# Patient Record
Sex: Female | Born: 1970 | Race: Black or African American | Hispanic: No | Marital: Married | Smoking: Former smoker
Health system: Southern US, Community
[De-identification: ages and names within clinical notes are randomized; demographics above are authoritative.]

## PROBLEM LIST (undated history)

## (undated) ENCOUNTER — Inpatient Hospital Stay (HOSPITAL_COMMUNITY): Payer: Self-pay

## (undated) DIAGNOSIS — T7840XA Allergy, unspecified, initial encounter: Secondary | ICD-10-CM

## (undated) DIAGNOSIS — F32A Depression, unspecified: Secondary | ICD-10-CM

## (undated) DIAGNOSIS — G4730 Sleep apnea, unspecified: Secondary | ICD-10-CM

## (undated) DIAGNOSIS — Z5189 Encounter for other specified aftercare: Secondary | ICD-10-CM

## (undated) DIAGNOSIS — R519 Headache, unspecified: Secondary | ICD-10-CM

## (undated) DIAGNOSIS — F329 Major depressive disorder, single episode, unspecified: Secondary | ICD-10-CM

## (undated) DIAGNOSIS — K219 Gastro-esophageal reflux disease without esophagitis: Secondary | ICD-10-CM

## (undated) DIAGNOSIS — I10 Essential (primary) hypertension: Secondary | ICD-10-CM

## (undated) DIAGNOSIS — F419 Anxiety disorder, unspecified: Secondary | ICD-10-CM

## (undated) DIAGNOSIS — D649 Anemia, unspecified: Secondary | ICD-10-CM

## (undated) DIAGNOSIS — M1990 Unspecified osteoarthritis, unspecified site: Secondary | ICD-10-CM

## (undated) DIAGNOSIS — O210 Mild hyperemesis gravidarum: Secondary | ICD-10-CM

## (undated) DIAGNOSIS — O09529 Supervision of elderly multigravida, unspecified trimester: Secondary | ICD-10-CM

## (undated) HISTORY — DX: Unspecified osteoarthritis, unspecified site: M19.90

## (undated) HISTORY — PX: WISDOM TOOTH EXTRACTION: SHX21

## (undated) HISTORY — PX: UPPER GASTROINTESTINAL ENDOSCOPY: SHX188

## (undated) HISTORY — DX: Supervision of elderly multigravida, unspecified trimester: O09.529

## (undated) HISTORY — DX: Essential (primary) hypertension: I10

## (undated) HISTORY — DX: Allergy, unspecified, initial encounter: T78.40XA

## (undated) HISTORY — PX: KNEE ARTHROSCOPY: SUR90

## (undated) HISTORY — PX: DILATION AND CURETTAGE OF UTERUS: SHX78

## (undated) HISTORY — DX: Sleep apnea, unspecified: G47.30

## (undated) HISTORY — DX: Encounter for other specified aftercare: Z51.89

## (undated) HISTORY — DX: Anxiety disorder, unspecified: F41.9

---

## 2013-01-21 LAB — OB RESULTS CONSOLE ABO/RH: RH Type: POSITIVE

## 2013-01-21 LAB — OB RESULTS CONSOLE ANTIBODY SCREEN: Antibody Screen: NEGATIVE

## 2013-01-21 LAB — OB RESULTS CONSOLE GC/CHLAMYDIA
Chlamydia: NEGATIVE
Gonorrhea: NEGATIVE

## 2013-01-21 LAB — OB RESULTS CONSOLE RPR: RPR: NONREACTIVE

## 2013-01-21 LAB — OB RESULTS CONSOLE HIV ANTIBODY (ROUTINE TESTING): HIV: NONREACTIVE

## 2013-01-21 LAB — OB RESULTS CONSOLE RUBELLA ANTIBODY, IGM: Rubella: IMMUNE

## 2013-01-21 LAB — OB RESULTS CONSOLE HEPATITIS B SURFACE ANTIGEN: Hepatitis B Surface Ag: NEGATIVE

## 2013-01-22 ENCOUNTER — Other Ambulatory Visit (HOSPITAL_COMMUNITY): Payer: Self-pay | Admitting: Nurse Practitioner

## 2013-01-22 ENCOUNTER — Encounter (HOSPITAL_COMMUNITY): Payer: Self-pay | Admitting: Nurse Practitioner

## 2013-01-22 DIAGNOSIS — Z3682 Encounter for antenatal screening for nuchal translucency: Secondary | ICD-10-CM

## 2013-02-13 ENCOUNTER — Ambulatory Visit (HOSPITAL_COMMUNITY)
Admission: RE | Admit: 2013-02-13 | Discharge: 2013-02-13 | Disposition: A | Discharge status: Home / Self Care | Payer: Medicaid Other | Source: Ambulatory Visit | Attending: Nurse Practitioner | Admitting: Nurse Practitioner

## 2013-02-13 ENCOUNTER — Other Ambulatory Visit (HOSPITAL_COMMUNITY): Payer: Self-pay | Admitting: Nurse Practitioner

## 2013-02-13 ENCOUNTER — Encounter (HOSPITAL_COMMUNITY): Payer: Self-pay

## 2013-02-13 ENCOUNTER — Ambulatory Visit (HOSPITAL_COMMUNITY): Admission: RE | Admit: 2013-02-13 | Payer: Medicaid Other | Source: Ambulatory Visit

## 2013-02-13 VITALS — BP 127/80 | HR 70 | Wt 202.5 lb

## 2013-02-13 DIAGNOSIS — O09529 Supervision of elderly multigravida, unspecified trimester: Secondary | ICD-10-CM | POA: Insufficient documentation

## 2013-02-13 DIAGNOSIS — Z3682 Encounter for antenatal screening for nuchal translucency: Secondary | ICD-10-CM

## 2013-02-13 DIAGNOSIS — Z3689 Encounter for other specified antenatal screening: Secondary | ICD-10-CM | POA: Insufficient documentation

## 2013-02-13 DIAGNOSIS — O351XX0 Maternal care for (suspected) chromosomal abnormality in fetus, not applicable or unspecified: Secondary | ICD-10-CM | POA: Insufficient documentation

## 2013-02-13 DIAGNOSIS — O3510X0 Maternal care for (suspected) chromosomal abnormality in fetus, unspecified, not applicable or unspecified: Secondary | ICD-10-CM | POA: Insufficient documentation

## 2013-02-13 DIAGNOSIS — O09521 Supervision of elderly multigravida, first trimester: Secondary | ICD-10-CM

## 2013-02-13 NOTE — Progress Notes (Signed)
Genetic Counseling  High-Risk Gestation Note  Appointment Date:  02/13/2013 Referred By: Brittany Spindle, NP Date of Birth:  09-14-1970 Partner: Brittany Cherry   Pregnancy History: [id number] Estimated Date of Delivery: 08/22/13 Estimated Gestational Age: [redacted]w[redacted]d Attending: Damaris Hippo, MD  Ms. Brittany Cherry was seen for genetic counseling, because of a maternal age of 42.    She was counseled regarding maternal age and the association with risk for chromosome conditions due to nondisjunction with aging of the ova.   We reviewed chromosomes, nondisjunction, and the associated 1 in 22 risk for fetal aneuploidy related to a maternal age of 42 y.o. at [redacted]w[redacted]d gestation.  She was counseled that the risk for aneuploidy decreases as gestational age increases, accounting for those pregnancies which spontaneously abort.  We specifically discussed Down syndrome (trisomy 57), trisomies 1 and 3, and sex chromosome aneuploidies (47,XXX and 47,XXY) including the common features and prognoses of each.   We reviewed available screening options including First Screen, Quad screen, noninvasive prenatal screening (NIPS)/cell free fetal DNA (cffDNA) testing, and detailed ultrasound.  She was counseled that screening tests are used to modify a patient's a priori risk for aneuploidy, typically based on age. This estimate provides a pregnancy specific risk assessment. We reviewed the benefits and limitations of each option. Specifically, we discussed the conditions for which each test screens, the detection rates, and false positive rates of each.  She was also counseled regarding diagnostic testing via CVS and amniocentesis. We reviewed the associated risks for complications, including spontaneous pregnancy loss.  Ms Cherry reported that she is currently uninsured, but has applied for pregnancy medicaid.  This considered, we also reviewed the cost of each screening option and the gestational window in which each  option is available.  After consideration of all the options, she elected to proceed with First trimester screening.  She would like to consider the option of NIPS (panorama testing), if her medicaid is approved.  A nuchal translucency ultrasound was attempted today; however, due to fetal positioning, a nuchal translucency measurement could not be obtained.  Brittany Cherry was scheduled to return next week for a repeat nuchal translucency ultrasound, and if an NT measurement is obtained, First trimester screening. In addition, the patient would like to return for a detailed ultrasound at ~18+ weeks gestation.  She understands that screening tests cannot rule out all birth defects or genetic syndromes. The patient was advised of this limitation and states she still does not want additional testing at this time.    Brittany Cherry was provided with written information regarding sickle cell anemia (SCA) including the carrier frequency and incidence in the African-American population, the availability of carrier testing and prenatal diagnosis if indicated.  In addition, we discussed that hemoglobinopathies are routinely screened for as part of the  newborn screening panel.  She declined hemoglobin electrophoresis today.  Both family histories were reviewed and found to be noncontributory for birth defects, intellectual disability, and known genetic conditions. Without further information regarding the provided family history, an accurate genetic risk cannot be calculated. Further genetic counseling is warranted if more information is obtained.  Brittany Cherry denied exposure to environmental toxins or chemical agents. She denied the use of alcohol, tobacco or street drugs. She denied significant viral illnesses during the course of her pregnancy. Her medical and surgical histories were contributory for two SABs and two TABs.  I counseled Brittany Cherry regarding the above risks and available options.  The approximate  face-to-face time with the  genetic counselor was 43 minutes.  Donald Prose, MS Certified Genetic Counselor

## 2013-02-16 ENCOUNTER — Encounter (HOSPITAL_COMMUNITY): Payer: Self-pay

## 2013-02-16 ENCOUNTER — Inpatient Hospital Stay (HOSPITAL_COMMUNITY)
Admission: AD | Admit: 2013-02-16 | Discharge: 2013-02-16 | Disposition: A | Discharge status: Home / Self Care | Payer: Medicaid Other | Source: Ambulatory Visit | Attending: Obstetrics & Gynecology | Admitting: Obstetrics & Gynecology

## 2013-02-16 DIAGNOSIS — B3731 Acute candidiasis of vulva and vagina: Secondary | ICD-10-CM | POA: Insufficient documentation

## 2013-02-16 DIAGNOSIS — B373 Candidiasis of vulva and vagina: Secondary | ICD-10-CM

## 2013-02-16 DIAGNOSIS — Z87891 Personal history of nicotine dependence: Secondary | ICD-10-CM | POA: Insufficient documentation

## 2013-02-16 DIAGNOSIS — O2390 Unspecified genitourinary tract infection in pregnancy, unspecified trimester: Secondary | ICD-10-CM | POA: Insufficient documentation

## 2013-02-16 DIAGNOSIS — L293 Anogenital pruritus, unspecified: Secondary | ICD-10-CM | POA: Insufficient documentation

## 2013-02-16 DIAGNOSIS — O210 Mild hyperemesis gravidarum: Secondary | ICD-10-CM | POA: Insufficient documentation

## 2013-02-16 HISTORY — DX: Mild hyperemesis gravidarum: O21.0

## 2013-02-16 LAB — URINALYSIS, ROUTINE W REFLEX MICROSCOPIC
Ketones, ur: 15 mg/dL — AB
Leukocytes, UA: NEGATIVE
Nitrite: NEGATIVE
Protein, ur: NEGATIVE mg/dL
Specific Gravity, Urine: >1.03 — ABNORMAL HIGH (ref 1.005–1.030)
Urobilinogen, UA: 4 mg/dL — ABNORMAL HIGH (ref 0.0–1.0)

## 2013-02-16 LAB — WET PREP, GENITAL: Clue Cells Wet Prep HPF POC: NONE SEEN

## 2013-02-16 MED ORDER — PROMETHAZINE HCL 25 MG PO TABS: 25.0000 mg | ORAL_TABLET | Freq: Four times a day (QID) | ORAL | Status: DC | PRN

## 2013-02-16 MED ORDER — ONDANSETRON 8 MG PO TBDP
8.0000 mg | ORAL_TABLET | Freq: Once | ORAL | Status: AC
  Administered 2013-02-16: 8 mg via ORAL
  Filled 2013-02-16: qty 1

## 2013-02-16 NOTE — Progress Notes (Signed)
Written and verbal d/c instructions given and understanding voiced. 

## 2013-02-16 NOTE — MAU Note (Signed)
Pt states Wednesday began vomiting post u/s here. Thursday and Friday vomited at least 4 times per day. Does have more spitting. Denies diarrhea. Has been constipated and had first bm this week today. Pt states also here for vaginal itching that began this am, vagina feels dry, no abnormal discharge noted. Denies pain.

## 2013-02-16 NOTE — MAU Provider Note (Signed)
History     CSN: [id number]  Arrival date and time: 02/16/13 1649   First Provider Initiated Contact with Patient 02/16/13 1739      Chief Complaint  Patient presents with  . Hyperemesis Gravidarum   HPI Brittany Cherry 42 y.o. [redacted]w[redacted]d  Client of the health department.  Comes to MAU with vomiting x one today.  Not able to take in many fluids today.  Has only had broth and water today.  Vomited 4x a day on Thursday and Friday.  Does not have medication for vomiting.   Has stopped taking the Vitamin B6 and Unisom as she was at the dentist and got antibiotics and pain medication from the dentist.  Did not want to take too many medications.   Also is having some vulvar itching and thinks she has a yeast infection.   Got a Terazol one day yeast treatment but has not used it yet.  OB History   Grav Para Term Preterm Abortions TAB SAB Ect Mult Living   8 3 3  4 2 2   3       Past Medical History  Diagnosis Date  . Hyperemesis gravidarum     Past Surgical History  Procedure Laterality Date  . Dilation and curettage of uterus    . Wisdom tooth extraction      History reviewed. No pertinent family history.  History  Substance Use Topics  . Smoking status: Former Games developer  . Smokeless tobacco: Never Used  . Alcohol Use: No    Allergies: No Known Allergies  Prescriptions prior to admission  Medication Sig Dispense Refill  . acetaminophen (TYLENOL) 325 MG tablet Take 650 mg by mouth every 6 (six) hours as needed (toothache).      Marland Kitchen acetaminophen-codeine (TYLENOL #3) 300-30 MG per tablet Take by mouth every 6 (six) hours as needed for moderate pain.      Marland Kitchen amoxicillin (AMOXIL) 500 MG tablet Take 500 mg by mouth 3 (three) times daily.      Marland Kitchen doxylamine, Sleep, (UNISOM) 25 MG tablet Take 25 mg by mouth at bedtime as needed (nausea).      Marland Kitchen ibuprofen (ADVIL,MOTRIN) 200 MG tablet Take 600 mg by mouth every 6 (six) hours as needed.      . Prenatal Vit-Fe Fumarate-FA (PRENATAL  MULTIVITAMIN) TABS tablet Take 1 tablet by mouth daily at 12 noon.      . pyridoxine (B-6) 100 MG tablet Take 100 mg by mouth as needed (nausea).        Review of Systems  Constitutional: Negative for fever.  Gastrointestinal: Positive for nausea, vomiting and constipation. Negative for abdominal pain and diarrhea.  Genitourinary:       No vaginal discharge. No vaginal bleeding. No dysuria. Vulvar itching.   Physical Exam   Blood pressure 110/62, pulse 71, temperature 98 F (36.7 C), temperature source Oral, resp. rate 18, height 5\' 2"  (1.575 m), weight 199 lb 6 oz (90.436 kg), last menstrual period 11/15/2012.  Physical Exam  Nursing note and vitals reviewed. Constitutional: She is oriented to person, place, and time. She appears well-developed and well-nourished.  HENT:  Head: Normocephalic.  Eyes: EOM are normal.  Neck: Neck supple.  GI: Soft. There is no tenderness. There is no rebound and no guarding.  FHT heard with doppler.  Genitourinary:  Speculum exam: Vagina - Small amount of creamy discharge, no odor Cervix - No contact bleeding Bimanual exam: Cervix closed Uterus non tender, enlarged Adnexa non tender, no masses  bilaterally GC/Chlam, wet prep done Chaperone present for exam.  Musculoskeletal: Normal range of motion.  Neurological: She is alert and oriented to person, place, and time.  Skin: Skin is warm and dry.  Psychiatric: She has a normal mood and affect.    MAU Course  Procedures Results for orders placed during the hospital encounter of 02/16/13 (from the past 24 hour(s))  URINALYSIS, ROUTINE W REFLEX MICROSCOPIC     Status: Abnormal   Collection Time    02/16/13  5:01 PM      Result Value Range   Color, Urine YELLOW  YELLOW   APPearance CLEAR  CLEAR   Specific Gravity, Urine >1.030 (*) 1.005 - 1.030   pH 6.0  5.0 - 8.0   Glucose, UA NEGATIVE  NEGATIVE mg/dL   Hgb urine dipstick NEGATIVE  NEGATIVE   Bilirubin Urine MODERATE (*) NEGATIVE    Ketones, ur 15 (*) NEGATIVE mg/dL   Protein, ur NEGATIVE  NEGATIVE mg/dL   Urobilinogen, UA 4.0 (*) 0.0 - 1.0 mg/dL   Nitrite NEGATIVE  NEGATIVE   Leukocytes, UA NEGATIVE  NEGATIVE  WET PREP, GENITAL     Status: Abnormal   Collection Time    02/16/13  5:57 PM      Result Value Range   Yeast Wet Prep HPF POC FEW (*) NONE SEEN   Trich, Wet Prep NONE SEEN  NONE SEEN   Clue Cells Wet Prep HPF POC NONE SEEN  NONE SEEN   WBC, Wet Prep HPF POC FEW (*) NONE SEEN    MDM Client drove herself to the hospital so will give Zofran ODT and prescription for Phenergan.  Currently has presumptive only Medicaid which does not include prescription benefits.  Assessment and Plan  Vaginal yeast infection Morning sickness  Plan Zofran 8 mg ODT in MAU Rx phenergan 25 mg one PO q 6 hours prn for vomiting. Advise to restart Unisom and B6. Keep appointments at the health dept. Use Terazol 1 day treatment that you purchased. Drink at least 8 8-oz glasses of water every day.   BURLESON,TERRI 02/16/2013, 5:59 PM

## 2013-02-19 ENCOUNTER — Other Ambulatory Visit: Payer: Self-pay | Admitting: Obstetrics & Gynecology

## 2013-02-19 DIAGNOSIS — O09529 Supervision of elderly multigravida, unspecified trimester: Secondary | ICD-10-CM

## 2013-02-19 DIAGNOSIS — Z3682 Encounter for antenatal screening for nuchal translucency: Secondary | ICD-10-CM

## 2013-02-20 ENCOUNTER — Ambulatory Visit (HOSPITAL_COMMUNITY): Admission: RE | Admit: 2013-02-20 | Payer: Medicaid Other | Source: Ambulatory Visit

## 2013-02-20 ENCOUNTER — Ambulatory Visit (HOSPITAL_COMMUNITY)
Admission: RE | Admit: 2013-02-20 | Discharge: 2013-02-20 | Disposition: A | Discharge status: Home / Self Care | Payer: Medicaid Other | Source: Ambulatory Visit | Attending: Nurse Practitioner | Admitting: Nurse Practitioner

## 2013-02-20 ENCOUNTER — Encounter (HOSPITAL_COMMUNITY): Payer: Self-pay

## 2013-02-20 ENCOUNTER — Other Ambulatory Visit: Payer: Self-pay | Admitting: Obstetrics & Gynecology

## 2013-02-20 DIAGNOSIS — O351XX0 Maternal care for (suspected) chromosomal abnormality in fetus, not applicable or unspecified: Secondary | ICD-10-CM | POA: Insufficient documentation

## 2013-02-20 DIAGNOSIS — O09529 Supervision of elderly multigravida, unspecified trimester: Secondary | ICD-10-CM | POA: Insufficient documentation

## 2013-02-20 DIAGNOSIS — Z3682 Encounter for antenatal screening for nuchal translucency: Secondary | ICD-10-CM

## 2013-02-20 DIAGNOSIS — Z3689 Encounter for other specified antenatal screening: Secondary | ICD-10-CM | POA: Insufficient documentation

## 2013-02-20 DIAGNOSIS — O3510X0 Maternal care for (suspected) chromosomal abnormality in fetus, unspecified, not applicable or unspecified: Secondary | ICD-10-CM | POA: Insufficient documentation

## 2013-02-21 ENCOUNTER — Other Ambulatory Visit (HOSPITAL_COMMUNITY): Payer: Self-pay | Admitting: Nurse Practitioner

## 2013-02-21 DIAGNOSIS — Z3689 Encounter for other specified antenatal screening: Secondary | ICD-10-CM

## 2013-03-27 ENCOUNTER — Ambulatory Visit (HOSPITAL_COMMUNITY)
Admission: RE | Admit: 2013-03-27 | Discharge: 2013-03-27 | Disposition: A | Discharge status: Home / Self Care | Payer: BC Managed Care – PPO | Source: Ambulatory Visit | Attending: Nurse Practitioner | Admitting: Nurse Practitioner

## 2013-03-27 DIAGNOSIS — Z363 Encounter for antenatal screening for malformations: Secondary | ICD-10-CM | POA: Insufficient documentation

## 2013-03-27 DIAGNOSIS — Z3689 Encounter for other specified antenatal screening: Secondary | ICD-10-CM

## 2013-03-27 DIAGNOSIS — Z1389 Encounter for screening for other disorder: Secondary | ICD-10-CM | POA: Insufficient documentation

## 2013-03-27 DIAGNOSIS — O09529 Supervision of elderly multigravida, unspecified trimester: Secondary | ICD-10-CM | POA: Insufficient documentation

## 2013-03-27 DIAGNOSIS — O358XX0 Maternal care for other (suspected) fetal abnormality and damage, not applicable or unspecified: Secondary | ICD-10-CM | POA: Insufficient documentation

## 2013-04-03 ENCOUNTER — Other Ambulatory Visit: Payer: Self-pay

## 2013-04-11 NOTE — L&D Delivery Note (Signed)
Delivery Note At 12:36 PM a viable and healthy female was delivered via Vaginal, Spontaneous Delivery (Presentation: ; Occiput Anterior).  APGAR: 9, 9; weight pend.   Placenta status: Intact, Spontaneous.  Cord: 3 vessels with the following complications: None.    Anesthesia: Epidural  Episiotomy: None Lacerations: None Est. Blood Loss (mL): 100  Mom to postpartum.  Baby to Couplet care / Skin to Skin.  Venia Carbon N Muhammad 08/18/2013, 1:25 PM

## 2013-04-22 ENCOUNTER — Other Ambulatory Visit (HOSPITAL_COMMUNITY): Payer: Self-pay | Admitting: Nurse Practitioner

## 2013-04-22 DIAGNOSIS — O289 Unspecified abnormal findings on antenatal screening of mother: Secondary | ICD-10-CM

## 2013-04-22 DIAGNOSIS — O09529 Supervision of elderly multigravida, unspecified trimester: Secondary | ICD-10-CM

## 2013-04-24 ENCOUNTER — Encounter (HOSPITAL_COMMUNITY): Payer: Self-pay

## 2013-04-24 ENCOUNTER — Ambulatory Visit (HOSPITAL_COMMUNITY)
Admission: RE | Admit: 2013-04-24 | Discharge: 2013-04-24 | Disposition: A | Discharge status: Home / Self Care | Payer: BC Managed Care – PPO | Source: Ambulatory Visit | Attending: Nurse Practitioner | Admitting: Nurse Practitioner

## 2013-04-24 VITALS — BP 114/67 | HR 79 | Wt 210.2 lb

## 2013-04-24 DIAGNOSIS — O09529 Supervision of elderly multigravida, unspecified trimester: Secondary | ICD-10-CM | POA: Insufficient documentation

## 2013-04-24 DIAGNOSIS — O289 Unspecified abnormal findings on antenatal screening of mother: Secondary | ICD-10-CM

## 2013-04-24 DIAGNOSIS — Z3689 Encounter for other specified antenatal screening: Secondary | ICD-10-CM | POA: Insufficient documentation

## 2013-04-24 NOTE — Progress Notes (Signed)
Brittany Cherry  was seen today for an ultrasound appointment.  See full report in [facility].  Impression: Single IUP at 22 6/7 weeks Normal interval anatomy.  Somewhat limited views of the fetal heart obtained (ductus) No markers associated with aneuploidy noted Fetal growth is appropriate (58th %tile) Normal amniotic fluid volume  Recommendations: Recommend follow-up ultrasound examination in 6 weeks for interval growth.  Benjaman Lobe, MD

## 2013-06-05 ENCOUNTER — Other Ambulatory Visit (HOSPITAL_COMMUNITY): Payer: Self-pay | Admitting: Nurse Practitioner

## 2013-06-05 ENCOUNTER — Ambulatory Visit (HOSPITAL_COMMUNITY)
Admission: RE | Admit: 2013-06-05 | Discharge: 2013-06-05 | Disposition: A | Discharge status: Home / Self Care | Payer: BC Managed Care – PPO | Source: Ambulatory Visit | Attending: Nurse Practitioner | Admitting: Nurse Practitioner

## 2013-06-05 DIAGNOSIS — O09529 Supervision of elderly multigravida, unspecified trimester: Secondary | ICD-10-CM | POA: Insufficient documentation

## 2013-07-17 ENCOUNTER — Ambulatory Visit (HOSPITAL_COMMUNITY)
Admission: RE | Admit: 2013-07-17 | Discharge: 2013-07-17 | Disposition: A | Discharge status: Home / Self Care | Payer: BC Managed Care – PPO | Source: Ambulatory Visit | Attending: Nurse Practitioner | Admitting: Nurse Practitioner

## 2013-07-17 ENCOUNTER — Encounter (HOSPITAL_COMMUNITY): Payer: Self-pay

## 2013-07-17 DIAGNOSIS — O289 Unspecified abnormal findings on antenatal screening of mother: Secondary | ICD-10-CM | POA: Insufficient documentation

## 2013-07-17 DIAGNOSIS — O09529 Supervision of elderly multigravida, unspecified trimester: Secondary | ICD-10-CM | POA: Insufficient documentation

## 2013-07-26 ENCOUNTER — Other Ambulatory Visit (HOSPITAL_COMMUNITY): Payer: Self-pay | Admitting: Nurse Practitioner

## 2013-07-26 DIAGNOSIS — O09529 Supervision of elderly multigravida, unspecified trimester: Secondary | ICD-10-CM

## 2013-07-26 LAB — OB RESULTS CONSOLE GBS: GBS: POSITIVE

## 2013-07-30 ENCOUNTER — Ambulatory Visit (HOSPITAL_COMMUNITY)
Admission: RE | Admit: 2013-07-30 | Discharge: 2013-07-30 | Disposition: A | Discharge status: Home / Self Care | Payer: BC Managed Care – PPO | Source: Ambulatory Visit | Attending: Nurse Practitioner | Admitting: Nurse Practitioner

## 2013-07-30 DIAGNOSIS — O09529 Supervision of elderly multigravida, unspecified trimester: Secondary | ICD-10-CM | POA: Insufficient documentation

## 2013-07-30 DIAGNOSIS — Z3689 Encounter for other specified antenatal screening: Secondary | ICD-10-CM | POA: Insufficient documentation

## 2013-08-06 ENCOUNTER — Ambulatory Visit (INDEPENDENT_AMBULATORY_CARE_PROVIDER_SITE_OTHER): Payer: BC Managed Care – PPO | Admitting: *Deleted

## 2013-08-06 VITALS — BP 122/74 | HR 80

## 2013-08-06 DIAGNOSIS — O09529 Supervision of elderly multigravida, unspecified trimester: Secondary | ICD-10-CM

## 2013-08-06 LAB — US OB FOLLOW UP

## 2013-08-06 NOTE — Progress Notes (Signed)
NST reviewed and reactive.  Brittany Cherry, M.D., FACOG    

## 2013-08-06 NOTE — Progress Notes (Signed)
Pt reports decreased FM x1 week

## 2013-08-09 ENCOUNTER — Other Ambulatory Visit (HOSPITAL_COMMUNITY): Payer: Self-pay | Admitting: Nurse Practitioner

## 2013-08-09 DIAGNOSIS — O09529 Supervision of elderly multigravida, unspecified trimester: Secondary | ICD-10-CM

## 2013-08-13 ENCOUNTER — Ambulatory Visit (INDEPENDENT_AMBULATORY_CARE_PROVIDER_SITE_OTHER): Payer: BC Managed Care – PPO | Admitting: *Deleted

## 2013-08-13 ENCOUNTER — Ambulatory Visit (HOSPITAL_COMMUNITY): Payer: BC Managed Care – PPO

## 2013-08-13 ENCOUNTER — Ambulatory Visit (HOSPITAL_COMMUNITY)
Admission: RE | Admit: 2013-08-13 | Discharge: 2013-08-13 | Disposition: A | Discharge status: Home / Self Care | Payer: BC Managed Care – PPO | Source: Ambulatory Visit | Attending: Nurse Practitioner | Admitting: Nurse Practitioner

## 2013-08-13 VITALS — BP 110/68 | HR 81

## 2013-08-13 DIAGNOSIS — O09529 Supervision of elderly multigravida, unspecified trimester: Secondary | ICD-10-CM

## 2013-08-13 DIAGNOSIS — Z3689 Encounter for other specified antenatal screening: Secondary | ICD-10-CM | POA: Insufficient documentation

## 2013-08-13 NOTE — Progress Notes (Signed)
NST reviewed and reactive.  Mohd Clemons L. Harraway-Smith, M.D., FACOG    

## 2013-08-13 NOTE — Progress Notes (Signed)
Pt has [place] for growth today.  Next appt @ [facility] on 5/8.  IOL scheduled on 5/14 @ 1930

## 2013-08-15 ENCOUNTER — Telehealth (HOSPITAL_COMMUNITY): Payer: Self-pay | Admitting: *Deleted

## 2013-08-15 ENCOUNTER — Encounter (HOSPITAL_COMMUNITY): Payer: Self-pay | Admitting: *Deleted

## 2013-08-15 NOTE — Telephone Encounter (Signed)
Preadmission screen  

## 2013-08-16 ENCOUNTER — Inpatient Hospital Stay (HOSPITAL_COMMUNITY)
Admission: AD | Admit: 2013-08-16 | Discharge: 2013-08-16 | Disposition: A | Discharge status: Home / Self Care | Payer: BC Managed Care – PPO | Source: Ambulatory Visit | Attending: Family Medicine | Admitting: Family Medicine

## 2013-08-16 ENCOUNTER — Encounter (HOSPITAL_COMMUNITY): Payer: Self-pay | Admitting: *Deleted

## 2013-08-16 DIAGNOSIS — O09529 Supervision of elderly multigravida, unspecified trimester: Secondary | ICD-10-CM | POA: Insufficient documentation

## 2013-08-16 DIAGNOSIS — Z87891 Personal history of nicotine dependence: Secondary | ICD-10-CM | POA: Insufficient documentation

## 2013-08-16 DIAGNOSIS — N898 Other specified noninflammatory disorders of vagina: Secondary | ICD-10-CM

## 2013-08-16 DIAGNOSIS — O99891 Other specified diseases and conditions complicating pregnancy: Secondary | ICD-10-CM | POA: Insufficient documentation

## 2013-08-16 DIAGNOSIS — O9989 Other specified diseases and conditions complicating pregnancy, childbirth and the puerperium: Principal | ICD-10-CM

## 2013-08-16 DIAGNOSIS — O26899 Other specified pregnancy related conditions, unspecified trimester: Secondary | ICD-10-CM

## 2013-08-16 LAB — AMNISURE RUPTURE OF MEMBRANE (ROM) NOT AT ARMC: Amnisure ROM: NEGATIVE

## 2013-08-16 NOTE — MAU Note (Signed)
Patient states she has been leaking fluid for about one week, was more last Monday less today. Was seen at [facility] today and sent to MAU for evaluation of ROM. Denies bleeding. States she has some irregular mild contractions. Reports good fetal movement.

## 2013-08-16 NOTE — MAU Provider Note (Signed)
Chief Complaint:  Rupture of Membranes   HPI: Brittany Cherry is a 43 y.o. [id number] at [redacted]w[redacted]d who presents to maternity admissions reporting fluid leakage.  Patient reports that last Monday she had significant fluid leakage, went to HD at that time and was checked but results were negative for amniotic fluid leakage. Also, reports recent US with normal fluid levels. Since that time she has had persistent fluid leakage "just stays wet" requiring frequent changing of panty-liners, no further episodes of a gush or fluid running down leg. Admits to regular contractions about 12-15x daily, now x 3-4 days has had 6-7x daily.   Today she went to routine [facility] visit at HD, with normal FHT and NST, measured fundal height around 39 wks (previously 39-40). States that they did not do a pelvic exam or fern test. Also no cervical exam (last SVE on 4/27 at 1cm). She was told to come here in for an amnisure.   Admits good fetal movement. Denies vaginal bleeding.  Pregnancy Course: PNC at [facility], since <10 weeks, dating by LMP/US. Denies any significant complications during pregnancy.  Past Medical History: Past Medical History  Diagnosis Date  . Hyperemesis gravidarum   . AMA (advanced maternal age) multigravida 35+     Past obstetric history: OB History  Gravida Para Term Preterm AB SAB TAB Ectopic Multiple Living  8 3 3  4 2 2   3     # Outcome Date GA Lbr Len/2nd Weight Sex Delivery Anes PTL Lv  8 CUR           7 TRM 2002 [redacted]w[redacted]d 24:00 3.629 kg (8 lb) F SVD   Y  6 TRM 1999 [redacted]w[redacted]d 24:00 3.487 kg (7 lb 11 oz)     Y  5 SAB 1998          4 SAB 1996          3 TAB 1996          2 TRM 1992 [redacted]w[redacted]d 12:00 3.345 kg (7 lb 6 oz) M SVD EPI  Y  1 TAB 1988              Past Surgical History: Past Surgical History  Procedure Laterality Date  . Dilation and curettage of uterus    . Wisdom tooth extraction      Family History: History reviewed. No pertinent family history.  Social History: History   Substance Use Topics  . Smoking status: Former Research scientist (life sciences)  . Smokeless tobacco: Never Used  . Alcohol Use: No    Allergies: No Known Allergies  Meds:  Prescriptions prior to admission  Medication Sig Dispense Refill  . acetaminophen (TYLENOL) 325 MG tablet Take 650 mg by mouth every 6 (six) hours as needed for mild pain or headache.       . Prenatal Vit-Fe Fumarate-FA (PRENATAL MULTIVITAMIN) TABS tablet Take 1 tablet by mouth at bedtime.         ROS: Pertinent findings in history of present illness.  Physical Exam  Blood pressure 126/67, pulse 69, temperature 99 F (37.2 C), temperature source Oral, resp. rate 16, height 5\' 2"  (1.575 m), weight 99.428 kg (219 lb 3.2 oz), last menstrual period 11/15/2012, SpO2 98.00%. GENERAL: Well-appearing, cooperative, comfortable, NAD HEENT: MMM HEART: RRR RESP: normal effort ABDOMEN: Soft, non-tender, gravid appropriate for gestational age EXTREMITIES: Nontender, no edema NEURO: alert and oriented SPECULUM EXAM: NEFG, no pooling, physiologic discharge, no blood, cervix not fully visualized Dilation: 1 Effacement (%): Thick Cervical Position: Posterior  Station:  (high) Exam by:: Velna Ochs RN, Dr. Parks Ranger  FHT:  Baseline 135 bpm, moderate variability, accelerations present, no decelerations Contractions: irregular, infrequent contractions, x 1-2 in 1 hour   Labs: Results for orders placed during the hospital encounter of 08/16/13 (from the past 24 hour(s))  AMNISURE RUPTURE OF MEMBRANE (ROM)     Status: None   Collection Time    08/16/13  5:20 PM      Result Value Ref Range   Amnisure ROM NEGATIVE      Imaging:  US Ob Limited  07/30/2013   OBSTETRICAL ULTRASOUND: This exam was performed within a [facility]. The OB US report was generated in the AS system, and faxed to the ordering physician.   This report is also available in [employer] and in the [employer]. See AS Obstetric US  report.  US Ob Follow Up  08/13/2013   OBSTETRICAL ULTRASOUND: This exam was performed within a [facility]. The OB US report was generated in the AS system, and faxed to the ordering physician.   This report is also available in [employer] and in the [employer]. See AS Obstetric US report.  US Fetal Bpp W/o Non Stress  07/30/2013   OBSTETRICAL ULTRASOUND: This exam was performed within a [facility]. The OB US report was generated in the AS system, and faxed to the ordering physician.   This report is also available in [employer] and in the [employer]. See AS Obstetric US report.  MAU Course:   Assessment: 1. Vaginal discharge in pregnancy   Brittany Cherry is a 43 y.o. [id number] at [redacted]w[redacted]d who presented to MAU for evaluation of ROM, sent by [facility]. History suggestive of possible ROM (prolonged period of fluid leakage / wetness > 1 week, w/o significant gush), speculum exam without evidence of pooling, fern slide collected (negative), collected amnisure. Patient comfortable, without regular UCs, FHT reactive and reassuring. SVE 1cm / thick / posterior (unchanged from > 1 week ago). Suspected unlikely ROM, without evidence of active labor.  UPDATE 1730 - Amnisure (negative)  Plan: - Discharge home - Labor precautions and when to return, encouraged fetal kick counts - Follow-up with next routine [facility] visit      Follow-up Information   Follow up with [facility] [place].   Contact information:   [place] [address] [phone]       Medication List         acetaminophen 325 MG tablet  Commonly known as:  TYLENOL  Take 650 mg by mouth every 6 (six) hours as needed for mild pain or headache.     prenatal multivitamin Tabs tablet  Take 1 tablet by mouth at bedtime.        [name], [place], PGY-1 08/16/2013 5:55 PM  I have seen  and examined this patient and agree with above documentation in the resident's note. Pt presented for r/o ROM after being sent from the [facility]. Neg pool and fern and neg amnisure done to confirm. All negative. Reassurance given. FWB- cat I tracing. F/u as scheduled at the HD.    Ebbie Latus, M.D. [facility] Fellow 08/16/2013 6:30 PM

## 2013-08-16 NOTE — Discharge Instructions (Signed)
Third Trimester of Pregnancy °The third trimester is from week 29 through week 42, months 7 through 9. The third trimester is a time when the fetus is growing rapidly. At the end of the ninth month, the fetus is about 20 inches in length and weighs 6 10 pounds.  °BODY CHANGES °Your body goes through many changes during pregnancy. The changes vary from woman to woman.  °· Your weight will continue to increase. You can expect to gain 25 35 pounds (11 16 kg) by the end of the pregnancy. °· You may begin to get stretch marks on your hips, abdomen, and breasts. °· You may urinate more often because the fetus is moving lower into your pelvis and pressing on your bladder. °· You may develop or continue to have heartburn as a result of your pregnancy. °· You may develop constipation because certain hormones are causing the muscles that push waste through your intestines to slow down. °· You may develop hemorrhoids or swollen, bulging veins (varicose veins). °· You may have pelvic pain because of the weight gain and pregnancy hormones relaxing your joints between the bones in your pelvis. Back aches may result from over exertion of the muscles supporting your posture. °· Your breasts will continue to grow and be tender. A yellow discharge may leak from your breasts called colostrum. °· Your belly button may stick out. °· You may feel short of breath because of your expanding uterus. °· You may notice the fetus "dropping," or moving lower in your abdomen. °· You may have a bloody mucus discharge. This usually occurs a few days to a week before labor begins. °· Your cervix becomes thin and soft (effaced) near your due date. °WHAT TO EXPECT AT YOUR PRENATAL EXAMS  °You will have prenatal exams every 2 weeks until week 36. Then, you will have weekly prenatal exams. During a routine prenatal visit: °· You will be weighed to make sure you and the fetus are growing normally. °· Your blood pressure is taken. °· Your abdomen will be  measured to track your baby's growth. °· The fetal heartbeat will be listened to. °· Any test results from the previous visit will be discussed. °· You may have a cervical check near your due date to see if you have effaced. °At around 36 weeks, your caregiver will check your cervix. At the same time, your caregiver will also perform a test on the secretions of the vaginal tissue. This test is to determine if a type of bacteria, Group B streptococcus, is present. Your caregiver will explain this further. °Your caregiver may ask you: °· What your birth plan is. °· How you are feeling. °· If you are feeling the baby move. °· If you have had any abnormal symptoms, such as leaking fluid, bleeding, severe headaches, or abdominal cramping. °· If you have any questions. °Other tests or screenings that may be performed during your third trimester include: °· Blood tests that check for low iron levels (anemia). °· Fetal testing to check the health, activity level, and growth of the fetus. Testing is done if you have certain medical conditions or if there are problems during the pregnancy. °FALSE LABOR °You may feel small, irregular contractions that eventually go away. These are called Braxton Hicks contractions, or false labor. Contractions may last for hours, days, or even weeks before true labor sets in. If contractions come at regular intervals, intensify, or become painful, it is best to be seen by your caregiver.  °  SIGNS OF LABOR  °· Menstrual-like cramps. °· Contractions that are 5 minutes apart or less. °· Contractions that start on the top of the uterus and spread down to the lower abdomen and back. °· A sense of increased pelvic pressure or back pain. °· A watery or bloody mucus discharge that comes from the vagina. °If you have any of these signs before the 37th week of pregnancy, call your caregiver right away. You need to go to the hospital to get checked immediately. °HOME CARE INSTRUCTIONS  °· Avoid all  smoking, herbs, alcohol, and unprescribed drugs. These chemicals affect the formation and growth of the baby. °· Follow your caregiver's instructions regarding medicine use. There are medicines that are either safe or unsafe to take during pregnancy. °· Exercise only as directed by your caregiver. Experiencing uterine cramps is a good sign to stop exercising. °· Continue to eat regular, healthy meals. °· Wear a good support bra for breast tenderness. °· Do not use hot tubs, steam rooms, or saunas. °· Wear your seat belt at all times when driving. °· Avoid raw meat, uncooked cheese, cat litter boxes, and soil used by cats. These carry germs that can cause birth defects in the baby. °· Take your prenatal vitamins. °· Try taking a stool softener (if your caregiver approves) if you develop constipation. Eat more high-fiber foods, such as fresh vegetables or fruit and whole grains. Drink plenty of fluids to keep your urine clear or pale yellow. °· Take warm sitz baths to soothe any pain or discomfort caused by hemorrhoids. Use hemorrhoid cream if your caregiver approves. °· If you develop varicose veins, wear support hose. Elevate your feet for 15 minutes, 3 4 times a day. Limit salt in your diet. °· Avoid heavy lifting, wear low heal shoes, and practice good posture. °· Rest a lot with your legs elevated if you have leg cramps or low back pain. °· Visit your dentist if you have not gone during your pregnancy. Use a soft toothbrush to brush your teeth and be gentle when you floss. °· A sexual relationship may be continued unless your caregiver directs you otherwise. °· Do not travel far distances unless it is absolutely necessary and only with the approval of your caregiver. °· Take prenatal classes to understand, practice, and ask questions about the labor and delivery. °· Make a trial run to the hospital. °· Pack your hospital bag. °· Prepare the baby's nursery. °· Continue to go to all your prenatal visits as directed  by your caregiver. °SEEK MEDICAL CARE IF: °· You are unsure if you are in labor or if your water has broken. °· You have dizziness. °· You have mild pelvic cramps, pelvic pressure, or nagging pain in your abdominal area. °· You have persistent nausea, vomiting, or diarrhea. °· You have a bad smelling vaginal discharge. °· You have pain with urination. °SEEK IMMEDIATE MEDICAL CARE IF:  °· You have a fever. °· You are leaking fluid from your vagina. °· You have spotting or bleeding from your vagina. °· You have severe abdominal cramping or pain. °· You have rapid weight loss or gain. °· You have shortness of breath with chest pain. °· You notice sudden or extreme swelling of your face, hands, ankles, feet, or legs. °· You have not felt your baby move in over an hour. °· You have severe headaches that do not go away with medicine. °· You have vision changes. °Document Released: 03/22/2001 Document Revised: 11/28/2012 Document Reviewed:   You have severe abdominal cramping or pain.   You have rapid weight loss or gain.   You have shortness of breath with chest pain.   You notice sudden or extreme swelling of your face, hands, ankles, feet, or legs.   You have not felt your baby move in over an hour.   You have severe headaches that do not go away with medicine.   You have vision changes.  Document Released: 03/22/2001 Document Revised: 11/28/2012 Document Reviewed: 05/29/2012  [employer] Patient Information 2014 [employer].

## 2013-08-18 ENCOUNTER — Encounter (HOSPITAL_COMMUNITY): Payer: Self-pay | Admitting: *Deleted

## 2013-08-18 ENCOUNTER — Inpatient Hospital Stay (HOSPITAL_COMMUNITY): Payer: BC Managed Care – PPO | Admitting: Anesthesiology

## 2013-08-18 ENCOUNTER — Encounter (HOSPITAL_COMMUNITY): Payer: BC Managed Care – PPO | Admitting: Anesthesiology

## 2013-08-18 ENCOUNTER — Inpatient Hospital Stay (HOSPITAL_COMMUNITY)
Admission: AD | Admit: 2013-08-18 | Discharge: 2013-08-20 | DRG: 767 | Disposition: A | Discharge status: Home / Self Care | Payer: BC Managed Care – PPO | Source: Ambulatory Visit | Attending: Family Medicine | Admitting: Family Medicine

## 2013-08-18 ENCOUNTER — Encounter (HOSPITAL_COMMUNITY)
Admission: AD | Disposition: A | Discharge status: Home / Self Care | Payer: Self-pay | Source: Ambulatory Visit | Attending: Family Medicine

## 2013-08-18 DIAGNOSIS — Z22330 Carrier of Group B streptococcus: Secondary | ICD-10-CM

## 2013-08-18 DIAGNOSIS — O4290 Premature rupture of membranes, unspecified as to length of time between rupture and onset of labor, unspecified weeks of gestation: Principal | ICD-10-CM | POA: Diagnosis present

## 2013-08-18 DIAGNOSIS — Z6841 Body Mass Index (BMI) 40.0 and over, adult: Secondary | ICD-10-CM

## 2013-08-18 DIAGNOSIS — O9989 Other specified diseases and conditions complicating pregnancy, childbirth and the puerperium: Secondary | ICD-10-CM

## 2013-08-18 DIAGNOSIS — Z302 Encounter for sterilization: Secondary | ICD-10-CM

## 2013-08-18 DIAGNOSIS — O99892 Other specified diseases and conditions complicating childbirth: Secondary | ICD-10-CM | POA: Diagnosis present

## 2013-08-18 DIAGNOSIS — Z9851 Tubal ligation status: Secondary | ICD-10-CM

## 2013-08-18 DIAGNOSIS — O09529 Supervision of elderly multigravida, unspecified trimester: Secondary | ICD-10-CM

## 2013-08-18 DIAGNOSIS — Z87891 Personal history of nicotine dependence: Secondary | ICD-10-CM

## 2013-08-18 DIAGNOSIS — E669 Obesity, unspecified: Secondary | ICD-10-CM | POA: Diagnosis present

## 2013-08-18 DIAGNOSIS — O99214 Obesity complicating childbirth: Secondary | ICD-10-CM

## 2013-08-18 HISTORY — PX: TUBAL LIGATION: SHX77

## 2013-08-18 LAB — CBC
HCT: 32.5 % — ABNORMAL LOW (ref 36.0–46.0)
HEMOGLOBIN: 11.7 g/dL — AB (ref 12.0–15.0)
MCH: 29.8 pg (ref 26.0–34.0)
MCHC: 36 g/dL (ref 30.0–36.0)
MCV: 82.7 fL (ref 78.0–100.0)
Platelets: 236 10*3/uL (ref 150–400)
RBC: 3.93 MIL/uL (ref 3.87–5.11)
RDW: 13.5 % (ref 11.5–15.5)
WBC: 9.1 10*3/uL (ref 4.0–10.5)

## 2013-08-18 LAB — TYPE AND SCREEN
ABO/RH(D): O POS
Antibody Screen: NEGATIVE

## 2013-08-18 LAB — RPR

## 2013-08-18 LAB — ABO/RH: ABO/RH(D): O POS

## 2013-08-18 SURGERY — LIGATION, FALLOPIAN TUBE, POSTPARTUM
Anesthesia: Epidural | Site: Abdomen | Laterality: Bilateral

## 2013-08-18 MED ORDER — MEPERIDINE HCL 25 MG/ML IJ SOLN: 6.2500 mg | INTRAMUSCULAR | Status: DC | PRN

## 2013-08-18 MED ORDER — LIDOCAINE-EPINEPHRINE (PF) 2 %-1:200000 IJ SOLN
INTRAMUSCULAR | Status: AC
  Filled 2013-08-18: qty 20

## 2013-08-18 MED ORDER — SENNOSIDES-DOCUSATE SODIUM 8.6-50 MG PO TABS
2.0000 | ORAL_TABLET | ORAL | Status: DC
  Administered 2013-08-18 – 2013-08-19 (×2): 2 via ORAL
  Filled 2013-08-18 (×2): qty 2

## 2013-08-18 MED ORDER — LACTATED RINGERS IV SOLN
INTRAVENOUS | Status: DC
  Administered 2013-08-18 (×3): via INTRAVENOUS

## 2013-08-18 MED ORDER — LIDOCAINE HCL (PF) 1 % IJ SOLN
INTRAMUSCULAR | Status: DC | PRN
  Administered 2013-08-18 (×2): 5 mL

## 2013-08-18 MED ORDER — CEFAZOLIN SODIUM-DEXTROSE 2-3 GM-% IV SOLR
2.0000 g | Freq: Once | INTRAVENOUS | Status: AC
  Administered 2013-08-18: 2 g via INTRAVENOUS
  Filled 2013-08-18: qty 50

## 2013-08-18 MED ORDER — OXYCODONE-ACETAMINOPHEN 5-325 MG PO TABS
1.0000 | ORAL_TABLET | ORAL | Status: DC | PRN
  Filled 2013-08-18 (×2): qty 2
  Filled 2013-08-18 (×4): qty 1
  Filled 2013-08-18: qty 2

## 2013-08-18 MED ORDER — PHENYLEPHRINE 40 MCG/ML (10ML) SYRINGE FOR IV PUSH (FOR BLOOD PRESSURE SUPPORT)
PREFILLED_SYRINGE | INTRAVENOUS | Status: AC
  Filled 2013-08-18: qty 10

## 2013-08-18 MED ORDER — BENZOCAINE-MENTHOL 20-0.5 % EX AERO: 1.0000 "application " | INHALATION_SPRAY | CUTANEOUS | Status: DC | PRN

## 2013-08-18 MED ORDER — SODIUM BICARBONATE 8.4 % IV SOLN
INTRAVENOUS | Status: DC | PRN
  Administered 2013-08-18: 10 mL via EPIDURAL
  Administered 2013-08-18: 3 mL via EPIDURAL
  Administered 2013-08-18: 7 mL via EPIDURAL

## 2013-08-18 MED ORDER — CITRIC ACID-SODIUM CITRATE 334-500 MG/5ML PO SOLN
30.0000 mL | ORAL | Status: DC | PRN
  Administered 2013-08-18: 30 mL via ORAL
  Filled 2013-08-18: qty 15

## 2013-08-18 MED ORDER — LIDOCAINE HCL (PF) 1 % IJ SOLN: 30.0000 mL | INTRAMUSCULAR | Status: DC | PRN

## 2013-08-18 MED ORDER — TERBUTALINE SULFATE 1 MG/ML IJ SOLN: 0.2500 mg | Freq: Once | INTRAMUSCULAR | Status: DC | PRN

## 2013-08-18 MED ORDER — FENTANYL CITRATE 0.05 MG/ML IJ SOLN: 25.0000 ug | INTRAMUSCULAR | Status: DC | PRN

## 2013-08-18 MED ORDER — MIDAZOLAM HCL 2 MG/2ML IJ SOLN: 0.5000 mg | Freq: Once | INTRAMUSCULAR | Status: DC | PRN

## 2013-08-18 MED ORDER — SODIUM BICARBONATE 8.4 % IV SOLN
INTRAVENOUS | Status: AC
  Filled 2013-08-18: qty 50

## 2013-08-18 MED ORDER — IBUPROFEN 600 MG PO TABS: 600.0000 mg | ORAL_TABLET | Freq: Four times a day (QID) | ORAL | Status: DC | PRN

## 2013-08-18 MED ORDER — TETANUS-DIPHTH-ACELL PERTUSSIS 5-2.5-18.5 LF-MCG/0.5 IM SUSP: 0.5000 mL | Freq: Once | INTRAMUSCULAR | Status: DC

## 2013-08-18 MED ORDER — BUPIVACAINE HCL (PF) 0.25 % IJ SOLN
INTRAMUSCULAR | Status: DC | PRN
  Administered 2013-08-18: 30 mL

## 2013-08-18 MED ORDER — DIPHENHYDRAMINE HCL 50 MG/ML IJ SOLN: 12.5000 mg | INTRAMUSCULAR | Status: DC | PRN

## 2013-08-18 MED ORDER — ONDANSETRON HCL 4 MG/2ML IJ SOLN
INTRAMUSCULAR | Status: DC | PRN
  Administered 2013-08-18: 4 mg via INTRAVENOUS

## 2013-08-18 MED ORDER — OXYCODONE-ACETAMINOPHEN 5-325 MG PO TABS
1.0000 | ORAL_TABLET | ORAL | Status: DC | PRN
  Administered 2013-08-18: 1 via ORAL
  Administered 2013-08-18: 2 via ORAL
  Administered 2013-08-18: 1 via ORAL
  Administered 2013-08-19 (×2): 2 via ORAL
  Administered 2013-08-19: 1 via ORAL
  Administered 2013-08-19: 2 via ORAL
  Administered 2013-08-20: 1 via ORAL
  Filled 2013-08-18 (×2): qty 2

## 2013-08-18 MED ORDER — FENTANYL 2.5 MCG/ML BUPIVACAINE 1/10 % EPIDURAL INFUSION (WH - ANES)
14.0000 mL/h | INTRAMUSCULAR | Status: DC | PRN
  Administered 2013-08-18: 14 mL/h via EPIDURAL

## 2013-08-18 MED ORDER — ZOLPIDEM TARTRATE 5 MG PO TABS: 5.0000 mg | ORAL_TABLET | Freq: Every evening | ORAL | Status: DC | PRN

## 2013-08-18 MED ORDER — LACTATED RINGERS IV SOLN
500.0000 mL | Freq: Once | INTRAVENOUS | Status: AC
  Administered 2013-08-18: 10:00:00 via INTRAVENOUS

## 2013-08-18 MED ORDER — MIDAZOLAM HCL 2 MG/2ML IJ SOLN
INTRAMUSCULAR | Status: AC
  Filled 2013-08-18: qty 2

## 2013-08-18 MED ORDER — DIBUCAINE 1 % RE OINT: 1.0000 "application " | TOPICAL_OINTMENT | RECTAL | Status: DC | PRN

## 2013-08-18 MED ORDER — LACTATED RINGERS IV SOLN
500.0000 mL | INTRAVENOUS | Status: DC | PRN
  Administered 2013-08-18 (×2): 500 mL via INTRAVENOUS

## 2013-08-18 MED ORDER — PRENATAL MULTIVITAMIN CH
1.0000 | ORAL_TABLET | Freq: Every day | ORAL | Status: DC
  Administered 2013-08-19 – 2013-08-20 (×2): 1 via ORAL
  Filled 2013-08-18 (×2): qty 1

## 2013-08-18 MED ORDER — DIPHENHYDRAMINE HCL 25 MG PO CAPS: 25.0000 mg | ORAL_CAPSULE | Freq: Four times a day (QID) | ORAL | Status: DC | PRN

## 2013-08-18 MED ORDER — OXYCODONE-ACETAMINOPHEN 5-325 MG PO TABS: 1.0000 | ORAL_TABLET | ORAL | Status: DC | PRN

## 2013-08-18 MED ORDER — EPHEDRINE 5 MG/ML INJ
10.0000 mg | INTRAVENOUS | Status: DC | PRN
Start: 2013-08-18 — End: 2013-08-18

## 2013-08-18 MED ORDER — EPHEDRINE 5 MG/ML INJ: 10.0000 mg | INTRAVENOUS | Status: DC | PRN

## 2013-08-18 MED ORDER — SIMETHICONE 80 MG PO CHEW
80.0000 mg | CHEWABLE_TABLET | ORAL | Status: DC | PRN
  Administered 2013-08-18 – 2013-08-19 (×3): 80 mg via ORAL
  Filled 2013-08-18 (×3): qty 1

## 2013-08-18 MED ORDER — ONDANSETRON HCL 4 MG/2ML IJ SOLN: 4.0000 mg | INTRAMUSCULAR | Status: DC | PRN

## 2013-08-18 MED ORDER — EPHEDRINE 5 MG/ML INJ
INTRAVENOUS | Status: AC
  Filled 2013-08-18: qty 4

## 2013-08-18 MED ORDER — IBUPROFEN 600 MG PO TABS
600.0000 mg | ORAL_TABLET | Freq: Four times a day (QID) | ORAL | Status: DC
  Administered 2013-08-18 – 2013-08-20 (×8): 600 mg via ORAL
  Filled 2013-08-18 (×8): qty 1

## 2013-08-18 MED ORDER — BUPIVACAINE HCL (PF) 0.25 % IJ SOLN
INTRAMUSCULAR | Status: AC
  Filled 2013-08-18: qty 30

## 2013-08-18 MED ORDER — FENTANYL CITRATE 0.05 MG/ML IJ SOLN
100.0000 ug | INTRAMUSCULAR | Status: DC | PRN
  Administered 2013-08-18 (×5): 100 ug via INTRAVENOUS
  Filled 2013-08-18 (×5): qty 2

## 2013-08-18 MED ORDER — ONDANSETRON HCL 4 MG/2ML IJ SOLN: 4.0000 mg | Freq: Four times a day (QID) | INTRAMUSCULAR | Status: DC | PRN

## 2013-08-18 MED ORDER — WITCH HAZEL-GLYCERIN EX PADS: 1.0000 "application " | MEDICATED_PAD | CUTANEOUS | Status: DC | PRN

## 2013-08-18 MED ORDER — ACETAMINOPHEN 325 MG PO TABS: 650.0000 mg | ORAL_TABLET | ORAL | Status: DC | PRN

## 2013-08-18 MED ORDER — MIDAZOLAM HCL 2 MG/2ML IJ SOLN
INTRAMUSCULAR | Status: DC | PRN
  Administered 2013-08-18 (×2): 1 mg via INTRAVENOUS

## 2013-08-18 MED ORDER — PHENYLEPHRINE 40 MCG/ML (10ML) SYRINGE FOR IV PUSH (FOR BLOOD PRESSURE SUPPORT): 80.0000 ug | PREFILLED_SYRINGE | INTRAVENOUS | Status: DC | PRN

## 2013-08-18 MED ORDER — OXYTOCIN 40 UNITS IN LACTATED RINGERS INFUSION - SIMPLE MED
62.5000 mL/h | INTRAVENOUS | Status: DC
  Administered 2013-08-18: 8 [IU] via INTRAVENOUS

## 2013-08-18 MED ORDER — PENICILLIN G POTASSIUM 5000000 UNITS IJ SOLR
5.0000 10*6.[IU] | Freq: Once | INTRAVENOUS | Status: AC
  Administered 2013-08-18: 5 10*6.[IU] via INTRAVENOUS
  Filled 2013-08-18: qty 5

## 2013-08-18 MED ORDER — PROMETHAZINE HCL 25 MG/ML IJ SOLN
6.2500 mg | INTRAMUSCULAR | Status: DC | PRN
Start: 2013-08-18 — End: 2013-08-18

## 2013-08-18 MED ORDER — ONDANSETRON HCL 4 MG PO TABS: 4.0000 mg | ORAL_TABLET | ORAL | Status: DC | PRN

## 2013-08-18 MED ORDER — LANOLIN HYDROUS EX OINT: TOPICAL_OINTMENT | CUTANEOUS | Status: DC | PRN

## 2013-08-18 MED ORDER — ONDANSETRON HCL 4 MG/2ML IJ SOLN
INTRAMUSCULAR | Status: AC
  Filled 2013-08-18: qty 2

## 2013-08-18 MED ORDER — OXYTOCIN BOLUS FROM INFUSION: 500.0000 mL | INTRAVENOUS | Status: DC

## 2013-08-18 MED ORDER — OXYTOCIN 40 UNITS IN LACTATED RINGERS INFUSION - SIMPLE MED
1.0000 m[IU]/min | INTRAVENOUS | Status: DC
  Administered 2013-08-18: 1 m[IU]/min via INTRAVENOUS
  Filled 2013-08-18: qty 1000

## 2013-08-18 MED ORDER — FENTANYL 2.5 MCG/ML BUPIVACAINE 1/10 % EPIDURAL INFUSION (WH - ANES)
INTRAMUSCULAR | Status: AC
  Filled 2013-08-18: qty 125

## 2013-08-18 MED ORDER — DEXTROSE 5 % IV SOLN
2.5000 10*6.[IU] | INTRAVENOUS | Status: DC
  Administered 2013-08-18 (×2): 2.5 10*6.[IU] via INTRAVENOUS
  Filled 2013-08-18 (×6): qty 2.5

## 2013-08-18 MED ORDER — KETOROLAC TROMETHAMINE 30 MG/ML IJ SOLN: 15.0000 mg | Freq: Once | INTRAMUSCULAR | Status: DC | PRN

## 2013-08-18 SURGICAL SUPPLY — 24 items
CLOTH BEACON ORANGE TIMEOUT ST (SAFETY) ×3 IMPLANT
CONTAINER PREFILL 10% NBF 15ML (MISCELLANEOUS) ×6 IMPLANT
DERMABOND ADVANCED (GAUZE/BANDAGES/DRESSINGS) ×2
DERMABOND ADVANCED .7 DNX12 (GAUZE/BANDAGES/DRESSINGS) ×1 IMPLANT
ELECT REM PT RETURN 9FT ADLT (ELECTROSURGICAL) ×3
ELECTRODE REM PT RTRN 9FT ADLT (ELECTROSURGICAL) ×1 IMPLANT
GLOVE BIOGEL PI IND STRL 8 (GLOVE) ×1 IMPLANT
GLOVE BIOGEL PI INDICATOR 8 (GLOVE) ×2
GLOVE ECLIPSE 8.0 STRL XLNG CF (GLOVE) ×3 IMPLANT
GOWN STRL REUS W/TWL LRG LVL3 (GOWN DISPOSABLE) ×6 IMPLANT
NEEDLE HYPO 25X1 1.5 SAFETY (NEEDLE) ×3 IMPLANT
NS IRRIG 1000ML POUR BTL (IV SOLUTION) ×3 IMPLANT
PACK ABDOMINAL MINOR (CUSTOM PROCEDURE TRAY) ×3 IMPLANT
PENCIL BUTTON HOLSTER BLD 10FT (ELECTRODE) ×3 IMPLANT
SPONGE LAP 4X18 X RAY DECT (DISPOSABLE) IMPLANT
SUT PLAIN 2 0 (SUTURE) ×4
SUT PLAIN ABS 2-0 CT1 27XMFL (SUTURE) ×2 IMPLANT
SUT VIC AB 0 CT1 27 (SUTURE) ×2
SUT VIC AB 0 CT1 27XBRD ANBCTR (SUTURE) ×1 IMPLANT
SUT VIC AB 4-0 KS 27 (SUTURE) ×3 IMPLANT
SYR CONTROL 10ML LL (SYRINGE) ×3 IMPLANT
TOWEL OR 17X24 6PK STRL BLUE (TOWEL DISPOSABLE) ×6 IMPLANT
TRAY FOLEY CATH 14FR (SET/KITS/TRAYS/PACK) ×3 IMPLANT
WATER STERILE IRR 1000ML POUR (IV SOLUTION) ×3 IMPLANT

## 2013-08-18 NOTE — Anesthesia Postprocedure Evaluation (Signed)
  Anesthesia Post Note  Patient: Brittany Cherry  Procedure(s) Performed: Procedure(s) (LRB): POST PARTUM TUBAL LIGATION (Bilateral)  Anesthesia type: Epidural  Patient location: PACU  Post pain: Pain level controlled  Post assessment: Post-op Vital signs reviewed  Last Vitals:  Filed Vitals:   08/18/13 1330  BP: 110/67  Pulse: 68  Temp:   Resp:     Post vital signs: Reviewed  Level of consciousness: awake  Complications: No apparent anesthesia complications

## 2013-08-18 NOTE — Anesthesia Preprocedure Evaluation (Signed)
Anesthesia Evaluation  Patient identified by MRN, date of birth, ID band Patient awake    Reviewed: Allergy & Precautions, H&P , Patient's Chart, lab work & pertinent test results  Airway Mallampati: III TM Distance: >3 FB Neck ROM: full    Dental   Pulmonary former smoker,  breath sounds clear to auscultation        Cardiovascular Rhythm:regular Rate:Normal     Neuro/Psych    GI/Hepatic   Endo/Other  Morbid obesity  Renal/GU      Musculoskeletal   Abdominal   Peds  Hematology   Anesthesia Other Findings   Reproductive/Obstetrics (+) Pregnancy                           Anesthesia Physical Anesthesia Plan  ASA: III  Anesthesia Plan: Epidural   Post-op Pain Management:    Induction:   Airway Management Planned:   Additional Equipment:   Intra-op Plan:   Post-operative Plan:   Informed Consent: I have reviewed the patients History and Physical, chart, labs and discussed the procedure including the risks, benefits and alternatives for the proposed anesthesia with the patient or authorized representative who has indicated his/her understanding and acceptance.     Plan Discussed with:   Anesthesia Plan Comments:         Anesthesia Quick Evaluation

## 2013-08-18 NOTE — Progress Notes (Signed)
Brittany Cherry is a 43 y.o. [phone] at [redacted]w[redacted]d admitted for PROM  Subjective: Pt not having adequate ctx, pt with q42m ctx. Pt has rec'd 2 doses of fent  Objective: BP 131/83  Pulse 72  Temp(Src) 98.4 F (36.9 C) (Oral)  Resp 18  Ht 5\' 2"  (1.575 m)  Wt 100.245 kg (221 lb)  BMI 40.41 kg/m2  LMP 11/15/2012      FHT:  FHR: 130s bpm, variability: moderate,  accelerations:  Present,  decelerations:  Present 1 prolonged decel to 60 x2 min with slow resolution UC:    q4-87min SVE:   Dilation: 2 Effacement (%): 30 Station: -3 Exam by:: Solveig Fangman MD  Labs: Lab Results  Component Value Date   WBC 9.1 08/18/2013   HGB 11.7* 08/18/2013   HCT 32.5* 08/18/2013   MCV 82.7 08/18/2013   PLT 236 08/18/2013    Assessment / Plan: PROM at [place] with in[place] ctx over last 4 hr, Cervical check deferred given lack adequate ctx. will start pitocin once reactivitiy increases  Labor: start pitocin 2x2 once reactive strip.  Preeclampsia:  no signs or symptoms of toxicity Fetal Wellbeing:  Category II - s/p reposition and fluid bolus (decreased variability after decel and fent dose @0451 ) Pain Control:  Epidural and Fentanyl PRN I/D:  PCN for GBS+, temp resolved Anticipated MOD:  NSVD  Allen Norris 08/18/2013, 5:13 AM

## 2013-08-18 NOTE — Anesthesia Procedure Notes (Signed)
Epidural Patient location during procedure: OB Start time: 08/18/2013 10:29 AM  Staffing Anesthesiologist: Rudean Curt Performed by: anesthesiologist   Preanesthetic Checklist Completed: patient identified, site marked, surgical consent, pre-op evaluation, timeout performed, IV checked, risks and benefits discussed and monitors and equipment checked  Epidural Patient position: sitting Prep: site prepped and draped and DuraPrep Patient monitoring: continuous pulse ox and blood pressure Approach: midline Location: L3-L4 Injection technique: LOR air  Needle:  Needle type: Tuohy  Needle gauge: 17 G Needle length: 9 cm and 9 Needle insertion depth: 7 cm Catheter type: closed end flexible Catheter size: 19 Gauge Catheter at skin depth: 12 cm Test dose: negative  Assessment Events: blood not aspirated, injection not painful, no injection resistance, negative IV test and no paresthesia  Additional Notes Patient identified.  Risk benefits discussed including failed block, incomplete pain control, headache, nerve damage, paralysis, blood pressure changes, nausea, vomiting, reactions to medication both toxic or allergic, and postpartum back pain.  Patient expressed understanding and wished to proceed.  All questions were answered.  Sterile technique used throughout procedure and epidural site dressed with sterile barrier dressing. No paresthesia or other complications noted.The patient did not experience any signs of intravascular injection such as tinnitus or metallic taste in mouth nor signs of intrathecal spread such as rapid motor block. Please see nursing notes for vital signs.

## 2013-08-18 NOTE — MAU Note (Signed)
Water broke at [place], clear fluid. Contractions every 8-10 minutes

## 2013-08-18 NOTE — Progress Notes (Signed)
  Subjective: Pt reports increase in contraction pain in past hour.  Desires epidural when possible.   Objective: BP 132/76  Pulse 67  Temp(Src) 98.4 F (36.9 C) (Oral)  Resp 18  Ht 5\' 2"  (1.575 m)  Wt 100.245 kg (221 lb)  BMI 40.41 kg/m2  LMP 11/15/2012      FHT:  FHR: 120's bpm, variability: moderate,  accelerations:  Present,  decelerations:  Absent UC:   regular, every 2-3 minutes SVE:   Dilation: 2 Effacement (%): 30 Station: -3 Exam by:: Odom  Labs: Lab Results  Component Value Date   WBC 9.1 08/18/2013   HGB 11.7* 08/18/2013   HCT 32.5* 08/18/2013   MCV 82.7 08/18/2013   PLT 236 08/18/2013    Assessment / Plan: Augmentation of labor, progressing well  Labor: Progressing normally Preeclampsia:  n/a Fetal Wellbeing:  Category I Pain Control:  Fentanyl I/D:  GBS pos Anticipated MOD:  NSVD  Brittany Cherry 08/18/2013, 8:32 AM

## 2013-08-18 NOTE — Lactation Note (Signed)
This note was copied from the chart of [facility]. Lactation Consultation Note  Patient Name: Brittany Cherry FYBOF'[id number] Date: 08/18/2013 Reason for consult: Initial assessment of this multipara and her newborn at 12 hours of age.  Baby had LATCH score of 5 at first breastfeeding attempt but later [facility] improved to "6" and RN taught mom hand expression and breast compression/latch techniques. Baby not showing any feeding cues at this time but LC reviewed benefits of STS and cue feedings. LC encouraged review of Baby and Me pp 9, 14 and 20-25 for STS and BF information. LC provided [employer] brochure and reviewed [facility] services and list of community and web site resources.     Maternal Data Formula Feeding for Exclusion: No Infant to breast within first hour of birth: No Breastfeeding delayed due to:: Other (comment) (reason for delay not documented) Has patient been taught Hand Expression?: Yes (see RN, Jana Half documenting at [phone]) Does the patient have breastfeeding experience prior to this delivery?: Yes  Feeding Feeding Type: Breast Fed Length of feed: 10 min  LATCH Score/Interventions Latch: Repeated attempts needed to sustain latch, nipple held in mouth throughout feeding, stimulation needed to elicit sucking reflex. Intervention(s): Skin to skin;Teach feeding cues;Waking techniques Intervention(s): Adjust position;Assist with latch;Breast massage;Breast compression  Audible Swallowing: A few with stimulation Intervention(s): Skin to skin;Hand expression Intervention(s): Skin to skin;Hand expression  Type of Nipple: Everted at rest and after stimulation (large) Intervention(s):  (sandwiched nipple into mouth)  Comfort (Breast/Nipple): Soft / non-tender     Hold (Positioning): Full assist, staff holds infant at breast Intervention(s): Breastfeeding basics reviewed;Support Pillows;Position options;Skin to skin  LATCH Score: 6  Lactation Tools Discussed/Used    STS, cue feedings, hand expresssion  Consult Status Consult Status: Follow-up Date: 08/19/13 Follow-up type: In-patient    Landis Gandy 08/18/2013, 9:02 PM

## 2013-08-18 NOTE — Anesthesia Postprocedure Evaluation (Signed)
Anesthesia Post Note  Patient: Brittany Cherry  Procedure(s) Performed: * No procedures listed *  Anesthesia type: Epidural  Patient location: Mother/Baby  Post pain: Pain level controlled  Post assessment: Post-op Vital signs reviewed  Last Vitals:  Filed Vitals:   08/18/13 1300  BP: 126/66  Pulse: 81  Temp:   Resp:     Post vital signs: Reviewed  Level of consciousness:alert  Complications: No apparent anesthesia complications

## 2013-08-18 NOTE — Op Note (Signed)
Preoperative diagnosis:  Multiparous female who desires permanent sterilization  Postoperative diagnosis:  Same as above  Procedure:  Postpartum partum bilateral tubal ligation using modified Pomeroy technique  Surgeon:  Florian Buff  Asst.:   Anesthesia:  Spinal  Findings:  Patient had a normal postpartum uterus tubes and ovaries.  Description of operation:  Patient was taken to the operating room where she was placed in the sitting position and underwent a spinal anesthetic.  She was then placed in the supine position.  She was then prepped and draped in the usual sterile fashion and a Foley catheter was placed in the bladder after the spinal was dosed.  A semilunar incision was made just below the umbilicus and taken down sharply to the fascia which was incised.  The peritoneum was then entered manually.  The right fallopian tube was identified including the fimbriated end.  The right fallopian tube was grasped and a 2-1/2 cm segment was removed using the modified Pomeroy technique.  There was good hemostasis.  Attention was then turned to the left fallopian tube which was identified including the fimbriated end.  A 2-1/2 cm segment in the distal isthmic and ampullary region portion of the tube was removed again using a modified Pomeroy technique.  There was good hemostasis.    The subcutaneous tissue fascia and peritoneum were closed using 0 vicryl in a running fashion.  The subcutaneous tissue was reapproximated with interrupted 3-0 Monocryl sutures.  The skin was closed using 4-0 Vicryl on a Keith needle in a subcuticular fashion.  Dermabond was placed for additional wound integrity and also to serve as a bandage.  The patient was taken to the recovery room in good stable condition.  All counts were correct.  Blood loss was minimal.  She received 2 g of Ancef preoperatively prophylactically.  EURE,LUTHER H 08/18/2013 2:59 PM  02/20/2011

## 2013-08-18 NOTE — Transfer of Care (Signed)
Immediate Anesthesia Transfer of Care Note  Patient: Brittany Cherry  Procedure(s) Performed: Procedure(s): POST PARTUM TUBAL LIGATION (Bilateral)  Patient Location: PACU  Anesthesia Type:Epidural  Level of Consciousness: awake and alert   Airway & Oxygen Therapy: Patient Spontanous Breathing  Post-op Assessment: Report given to PACU RN and Post -op Vital signs reviewed and stable  Post vital signs: Reviewed and stable  Complications: No apparent anesthesia complications

## 2013-08-18 NOTE — Anesthesia Preprocedure Evaluation (Signed)
Anesthesia Evaluation  Patient identified by MRN, date of birth, ID band Patient awake    Reviewed: Allergy & Precautions, H&P , Patient's Chart, lab work & pertinent test results  Airway Mallampati: III TM Distance: >3 FB Neck ROM: full    Dental   Pulmonary former smoker,  breath sounds clear to auscultation        Cardiovascular Rhythm:regular Rate:Normal     Neuro/Psych    GI/Hepatic   Endo/Other  Morbid obesity  Renal/GU      Musculoskeletal   Abdominal   Peds  Hematology   Anesthesia Other Findings   Reproductive/Obstetrics                           Anesthesia Physical  Anesthesia Plan  ASA: III  Anesthesia Plan: Epidural   Post-op Pain Management:    Induction:   Airway Management Planned:   Additional Equipment:   Intra-op Plan:   Post-operative Plan:   Informed Consent: I have reviewed the patients History and Physical, chart, labs and discussed the procedure including the risks, benefits and alternatives for the proposed anesthesia with the patient or authorized representative who has indicated his/her understanding and acceptance.     Plan Discussed with:   Anesthesia Plan Comments:         Anesthesia Quick Evaluation

## 2013-08-18 NOTE — H&P (Signed)
Brittany Cherry is a 43 y.o. female [phone] with IUP at [redacted]w[redacted]d presenting for rupture of membranes at 0045 this mornnig. Pt states she has been having irregular contractions every 44minutes without vaginal bleeding.  Membranes are ruptured, clear fluid, with active fetal movement.   PNCare at HD since 12 wks  Prenatal History/Complications: Hgb C trait GBS+ 1:78 risk of downs.  AMA   Past Medical History: Past Medical History  Diagnosis Date  . Hyperemesis gravidarum   . AMA (advanced maternal age) multigravida 35+     Past Surgical History: Past Surgical History  Procedure Laterality Date  . Dilation and curettage of uterus    . Wisdom tooth extraction      Obstetrical History: OB History   Grav Para Term Preterm Abortions TAB SAB Ect Mult Living   8 3 3  4 2 2   3       Gynecological History: OB History   Grav Para Term Preterm Abortions TAB SAB Ect Mult Living   8 3 3  4 2 2   3       Social History: History   Social History  . Marital Status: Single    Spouse Name: N/A    Number of Children: N/A  . Years of Education: N/A   Social History Main Topics  . Smoking status: Former Research scientist (life sciences)  . Smokeless tobacco: Never Used  . Alcohol Use: No  . Drug Use: No  . Sexual Activity: Yes    Birth Control/ Protection: None   Other Topics Concern  . None   Social History Narrative  . None    Family History: History reviewed. No pertinent family history.  Allergies: No Known Allergies  Prescriptions prior to admission  Medication Sig Dispense Refill  . acetaminophen (TYLENOL) 325 MG tablet Take 650 mg by mouth every 6 (six) hours as needed for mild pain or headache.       . Prenatal Vit-Fe Fumarate-FA (PRENATAL MULTIVITAMIN) TABS tablet Take 1 tablet by mouth at bedtime.          Review of Systems   Constitutional: no complaints at this time.  Blood pressure 127/82, pulse 77, temperature 100.6 F (38.1 C), temperature source Oral, resp. rate 20, height  5\' 2"  (1.575 m), weight 100.245 kg (221 lb), last menstrual period 11/15/2012. General appearance: alert, cooperative, appears stated age and no distress Lungs: clear to auscultation bilaterally Heart: regular rate and rhythm Abdomen: soft, non-tender; bowel sounds normal Pelvic: adequate Extremities: Homans sign is negative, no sign of DVT DTR's 1+ Presentation: cephalic Fetal monitoringBaseline: 130s bpm, Variability: Good {> 6 bpm), Accelerations: Reactive and Decelerations: Absent Uterine activity q8-47min Dilation: 2 Effacement (%): 30 Station: -3 Exam by:: Duron Meister MD   Prenatal labs: ABO, Rh: O/Positive/-- (10/13 0000) Antibody: Negative (10/13 0000) Rubella:   RPR: Nonreactive (10/13 0000)  HBsAg: Negative (10/13 0000)  HIV: Non-reactive (10/13 0000)  GBS: Positive (04/17 0000)  1 hr Glucola 105 Genetic screening  105 Anatomy US WNL   Prenatal Transfer Tool  Maternal Diabetes: No Genetic Screening: Abnormal:  Results: Elevated risk of Trisomy 21 1:78 risk of downs Maternal Ultrasounds/Referrals: Normal Fetal Ultrasounds or other Referrals:  None Maternal Substance Abuse:  No Significant Maternal Medications:  None Significant Maternal Lab Results: Lab values include: Group B Strep positive, hgb C trait,   No results found for this or any previous visit (from the past 24 hour(s)).  Assessment: Brittany Cherry is a 43 y.o. [id number] at [redacted]w[redacted]d by L=13 here  for PROM #Labor: will monitor for 4hr after rupture, if not having increased contraction pattern will start pitocin #Pain: Desires IV pain meds and epidural #FWB: 1:78 risk of downs, Cat I strip #ID:  GBS+ start PCN, of note maternal temp 100.6, otherwise stable vitals and stable fetus. No evidence of infection at this time. Will monitor on PCN #MOF: Breast #Circ:  Desires in patient  Allen Norris 08/18/2013, 2:00 AM

## 2013-08-18 NOTE — H&P (Signed)
Attestation of Attending Supervision of Advanced Practitioner (PA/CNM/NP): Evaluation and management procedures were performed by the Advanced Practitioner under my supervision and collaboration.  I have reviewed the Advanced Practitioner's note and chart, and I agree with the management and plan.  Johniece Hornbaker S Terrell Shimko, MD [facility] Attending 08/18/2013 7:40 AM   

## 2013-08-19 ENCOUNTER — Encounter (HOSPITAL_COMMUNITY): Payer: Self-pay | Admitting: Obstetrics & Gynecology

## 2013-08-19 NOTE — Progress Notes (Signed)
Pt started [facility] in October of 2014 and went for more than 3 visits (started at health department)

## 2013-08-19 NOTE — Anesthesia Postprocedure Evaluation (Signed)
  Anesthesia Post-op Note  Patient: Brittany Cherry  Procedure(s) Performed: Procedure(s): POST PARTUM TUBAL LIGATION (Bilateral)  Patient Location: Mother/Baby  Anesthesia Type:Epidural  Level of Consciousness: awake  Airway and Oxygen Therapy: Patient Spontanous Breathing  Post-op Pain: mild  Post-op Assessment: Post-op Vital signs reviewed, Patient's Cardiovascular Status Stable and Respiratory Function Stable  Post-op Vital Signs: stable  Last Vitals:  Filed Vitals:   08/19/13 0556  BP: 104/64  Pulse: 76  Temp: 36.3 C  Resp: 20    Complications: No apparent anesthesia complications

## 2013-08-19 NOTE — Lactation Note (Signed)
This note was copied from the chart of [place]. Lactation Consultation Note Follow up consult:  Offered to assist mother with breastfeeding. Mother states at this time she wants to formula feed due to his rising bilirubin. She is still wearing shells and has not fully given up on breastfeeding but at this time she is going to formula feed. She will call if she needs [place].  Patient Name: Boy Susen Haskew [id number] Date: 08/19/2013 Reason for consult: Follow-up assessment   Maternal Data    Feeding Feeding Type: Bottle Fed - Formula  LATCH Score/Interventions                      Lactation Tools Discussed/Used     Consult Status Consult Status: Complete    Carlye Grippe 08/19/2013, 2:57 PM

## 2013-08-19 NOTE — Lactation Note (Signed)
This note was copied from the chart of Brittany Cherry. Lactation Consultation Note Mom has pendulum breast w/nipples everted but compresses inwards. #24 NS given d/t large size nipple. Shells to be worn in am with bra. Has hand pump, hand expression taught, no colostrum noted. Mom stated she saw a drop of colostrum earlier today. W/curve tip syring 2 ml formula given in NS. Difficult to latch baby d/t breast so soft, sink inwards. Encouraged mom to use "C" hold to apply pressure behind the nipple to push it in baby's mouth when first latches instead of the nipple sinking inwards. Elevated pendulum breast on wash cloth. Baby gaggy and gassy. Mom please how baby done. Baby needed much stimulation to feed. Encouraged to pre-pump prior to feeding to pull nipple out and "prime" the colostrum. Baby's skin bili high, will have serum drawn in am. Patient Name: [name] [id number] Date: 08/19/2013 Reason for consult: Follow-up assessment;Difficult latch   Maternal Data    Feeding Feeding Type: Breast Fed  LATCH Score/Interventions Latch: Repeated attempts needed to sustain latch, nipple held in mouth throughout feeding, stimulation needed to elicit sucking reflex. Intervention(s): Teach feeding cues Intervention(s): Assist with latch;Adjust position;Breast massage;Breast compression  Audible Swallowing: A few with stimulation Intervention(s): Hand expression Intervention(s): Hand expression;Alternate breast massage  Type of Nipple: Everted at rest and after stimulation (compresses inwards) Intervention(s): Shells;Hand pump  Comfort (Breast/Nipple): Soft / non-tender     Hold (Positioning): Assistance needed to correctly position infant at breast and maintain latch. Intervention(s): Breastfeeding basics reviewed;Support Pillows;Position options;Skin to skin  LATCH Score: 7  Lactation Tools Discussed/Used Tools: Nipple Shields;Pump;Shells Nipple shield size: 24 Shell Type:  Inverted Breast pump type: Manual   Consult Status Consult Status: Follow-up Date: 08/19/13 Follow-up type: In-patient    Theodoro Kalata 08/19/2013, 3:32 AM

## 2013-08-19 NOTE — Progress Notes (Signed)
Post Partum Day 1 Subjective: no complaints, up ad lib, voiding, tolerating PO and + flatus  Objective: Blood pressure 104/64, pulse 76, temperature 97.3 F (36.3 C), temperature source Oral, resp. rate 20, height 5\' 2"  (1.575 m), weight 221 lb (100.245 kg), last menstrual period 11/15/2012, SpO2 97.00%, unknown if currently breastfeeding.  Physical Exam:  General: alert, cooperative and no distress Lochia: appropriate Uterine Fundus: firm Incision: healing well DVT Evaluation: No evidence of DVT seen on physical exam.   Recent Labs  08/18/13 0220  HGB 11.7*  HCT 32.5*    Assessment/Plan: Plan for discharge tomorrow, Breastfeeding and Contraception PP BTL   LOS: 1 day   Brittany Cherry 08/19/2013, 7:49 AM

## 2013-08-19 NOTE — Addendum Note (Signed)
Addendum created 08/19/13 0829 by Ignacia Bayley, CRNA   Modules edited: Notes Section   Notes Section:  File: [id number]

## 2013-08-20 ENCOUNTER — Other Ambulatory Visit: Payer: BC Managed Care – PPO

## 2013-08-20 DIAGNOSIS — Z9851 Tubal ligation status: Secondary | ICD-10-CM

## 2013-08-20 MED ORDER — DOCUSATE SODIUM 100 MG PO CAPS: 100.0000 mg | ORAL_CAPSULE | Freq: Two times a day (BID) | ORAL | Status: DC

## 2013-08-20 MED ORDER — IBUPROFEN 600 MG PO TABS: 600.0000 mg | ORAL_TABLET | Freq: Four times a day (QID) | ORAL | Status: DC

## 2013-08-20 MED ORDER — OXYCODONE-ACETAMINOPHEN 5-325 MG PO TABS: 1.0000 | ORAL_TABLET | ORAL | Status: DC | PRN

## 2013-08-20 NOTE — Discharge Instructions (Signed)
Postpartum Care After Vaginal Delivery After you deliver your newborn (postpartum period), the usual stay in the hospital is 24 72 hours. If there were problems with your labor or delivery, or if you have other medical problems, you might be in the hospital longer.  While you are in the hospital, you will receive help and instructions on how to care for yourself and your newborn during the postpartum period.  While you are in the hospital:  Be sure to tell your nurses if you have pain or discomfort, as well as where you feel the pain and what makes the pain worse.  If you had an incision made near your vagina (episiotomy) or if you had some tearing during delivery, the nurses may put ice packs on your episiotomy or tear. The ice packs may help to reduce the pain and swelling.  If you are breastfeeding, you may feel uncomfortable contractions of your uterus for a couple of weeks. This is normal. The contractions help your uterus get back to normal size.  It is normal to have some bleeding after delivery.  For the first 1 3 days after delivery, the flow is red and the amount may be similar to a period.  It is common for the flow to start and stop.  In the first few days, you may pass some small clots. Let your nurses know if you begin to pass large clots or your flow increases.  Do not  flush blood clots down the toilet before having the nurse look at them.  During the next 3 10 days after delivery, your flow should become more watery and pink or brown-tinged in color.  Ten to fourteen days after delivery, your flow should be a small amount of yellowish-white discharge.  The amount of your flow will decrease over the first few weeks after delivery. Your flow may stop in 6 8 weeks. Most women have had their flow stop by 12 weeks after delivery.  You should change your sanitary pads frequently.  Wash your hands thoroughly with soap and water for at least 20 seconds after changing pads, using  the toilet, or before holding or feeding your newborn.  You should feel like you need to empty your bladder within the first 6 8 hours after delivery.  In case you become weak, lightheaded, or faint, call your nurse before you get out of bed for the first time and before you take a shower for the first time.  Within the first few days after delivery, your breasts may begin to feel tender and full. This is called engorgement. Breast tenderness usually goes away within 48 72 hours after engorgement occurs. You may also notice milk leaking from your breasts. If you are not breastfeeding, do not stimulate your breasts. Breast stimulation can make your breasts produce more milk.  Spending as much time as possible with your newborn is very important. During this time, you and your newborn can feel close and get to know each other. Having your newborn stay in your room (rooming in) will help to strengthen the bond with your newborn. It will give you time to get to know your newborn and become comfortable caring for your newborn.  Your hormones change after delivery. Sometimes the hormone changes can temporarily cause you to feel sad or tearful. These feelings should not last more than a few days. If these feelings last longer than that, you should talk to your caregiver.  If desired, talk to your caregiver about  methods of family planning or contraception.  Talk to your caregiver about immunizations. Your caregiver may want you to have the following immunizations before leaving the hospital:  Tetanus, diphtheria, and pertussis (Tdap) or tetanus and diphtheria (Td) immunization. It is very important that you and your family (including grandparents) or others caring for your newborn are up-to-date with the Tdap or Td immunizations. The Tdap or Td immunization can help protect your newborn from getting ill.  Rubella immunization.  Varicella (chickenpox) immunization.  Influenza immunization. You should  receive this annual immunization if you did not receive the immunization during your pregnancy. Document Released: 01/23/2007 Document Revised: 12/21/2011 Document Reviewed: 11/23/2011 [facility] Patient Information 2014 [place].    Laparoscopic Tubal Ligation Laparoscopic tubal ligation is a procedure that closes the fallopian tubes at a time other than right after childbirth. By closing the fallopian tubes, the eggs that are released from the ovaries cannot enter the uterus and sperm cannot reach the egg. Tubal ligation is also known as getting your "tubes tied." Tubal ligation is done so you will not be able to get pregnant or have a baby.  Although this procedure may be reversed, it should be considered permanent and irreversible. If you want to have future pregnancies, you should not have this procedure.  LET YOUR CAREGIVER KNOW ABOUT:  Allergies to food or medicine.  Medicines taken, including vitamins, herbs, eyedrops, over-the-counter medicines, and creams.  Use of steroids (by mouth or creams).  Previous problems with numbing medicines.  History of bleeding problems or blood clots.  Any recent colds or infections.  Previous surgery.  Other health problems, including diabetes and kidney problems.  Possibility of pregnancy, if this applies.  Any past pregnancies. RISKS AND COMPLICATIONS   Infection.  Bleeding.  Injury to surrounding organs.  Anesthetic side effects.  Failure of the procedure.  Ectopic pregnancy.  Future regret about having the procedure done. BEFORE THE PROCEDURE  Do not take aspirin or blood thinners a week before the procedure or as directed. This can cause bleeding.  Do not eat or drink anything 6 to 8 hours before the procedure. PROCEDURE   You may be given a medicine to help you relax (sedative) before the procedure. You will be given a medicine to make you sleep (general anesthetic) during the procedure.  A tube will be put  down your throat to help your breath while under general anesthesia.  Two small cuts (incisions) are made in the lower abdominal area and near the belly button.  Your abdominal area will be inflated with a safe gas (carbon dioxide). This helps give the surgeon room to operate, visualize, and helps the surgeon avoid other organs.  A thin, lighted tube (laparoscope) with a camera attached is inserted into your abdomen through one of the incisions near the belly button. Other small instruments are also inserted through the other abdominal incision.  The fallopian tubes are located and are either blocked with a ring, clip, or are burned (cauterized).  After the fallopian tubes are blocked, the gas is released from the abdomen.  The incisions will be closed with stitches (sutures), and a bandage may be placed over the incisions. AFTER THE PROCEDURE   You will rest in a recovery room for 1 4 hours until you are stable and doing well.  You will also have some mild abdominal discomfort for 3 7 days. You will be given pain medicine to ease any discomfort.  As long as there are no problems,  you may be allowed to go home. Someone will need to drive you home and be with you for at least 24 hours once home.  You may have some mild discomfort in the throat. This is from the tube placed in your throat while you were sleeping.  You may experience discomfort in the shoulder area from some trapped air between the liver and diaphragm. This sensation is normal and will slowly go away on its own. Document Released: 07/04/2000 Document Revised: 09/27/2011 Document Reviewed: 07/09/2011 [facility] Patient Information 2014 [place].

## 2013-08-20 NOTE — Discharge Summary (Signed)
Obstetric Discharge Summary Reason for Admission: onset of labor and rupture of membranes Prenatal Procedures: NST and ultrasound Intrapartum Procedures: spontaneous vaginal delivery Postpartum Procedures: P.P. tubal ligation Complications-Operative and Postpartum: none Hemoglobin  Date Value Ref Range Status  08/18/2013 11.7* 12.0 - 15.0 g/dL Final     HCT  Date Value Ref Range Status  08/18/2013 32.5* 36.0 - 46.0 % Final    Discharge Diagnoses: Term Pregnancy-delivered, PP BTL  Hospital Course:  Brittany Cherry is a 43 y.o. [id number] who presented with SOL and SROM, continued to progress well. She had a uncomplicated SVD within 24 hours of admission, delivered healthy baby boy. Postpartum course was uncomplicated. Patient underwent PP BTL (on 08/18/13, same day as delivery), previously consented and documented. Tolerated procedure well without complications. Prior to discharge, she was able to ambulate, tolerate PO and void normally, +flatus, no BM, vaginal bleeding improved, and pain controlled on PO Motrin and Percocet. Reported some difficulty with breastfeeding, resumed bottle feeding at time of discharge. As noted, BTL for contraception. She was discharged home with instructions for postpartum care.  Delivery Note  At 12:36 PM a viable and healthy female was delivered via Vaginal, Spontaneous Delivery (Presentation: ; Occiput Anterior). APGAR: 9, 9; weight pend.  Placenta status: Intact, Spontaneous. Cord: 3 vessels with the following complications: None.  Anesthesia: Epidural  Episiotomy: None  Lacerations: None  Est. Blood Loss (mL): 100  Mom to postpartum. Baby to Couplet care / Skin to Skin.  [place]  08/18/2013, 1:25 PM  PP BTL Op Note - See op note dated 08/18/13 for details  Service date: 08/18/2013 2:58 PM   Preoperative diagnosis: Multiparous female who desires permanent sterilization  Postoperative diagnosis: Same as above  Procedure: Postpartum partum  bilateral tubal ligation using modified [place]oy technique  Surgeon: Brittany Cherry  Asst.:  Anesthesia: Spinal  Findings: Patient had a normal postpartum uterus tubes and ovaries.    Physical Exam:  General: alert and cooperative, NAD Lochia: appropriate Uterine Fundus: firm DVT Evaluation: No evidence of DVT seen on physical exam.  Discharge Information: Date: 08/20/2013 Activity: pelvic rest Diet: routine Medications: PNV, Ibuprofen, Colace and Percocet Baby feeding: plans to breastfeed / bottle Contraception: bilateral tubal ligation (postpartum 08/18/13) Condition: stable Instructions: refer to practice specific booklet Discharge to: home   Newborn Data: Live born female  Birth Weight: 7 lb 3 oz (3260 g) APGAR: 9, 9  Home with mother, anticipated following discharge from newborn nursery.  Brittany Cherry, [place], PGY-1  08/20/2013, 7:44 AM  I have seen the patient with the resident/student and agree with the above.  Brittany Cherry

## 2013-08-22 ENCOUNTER — Inpatient Hospital Stay (HOSPITAL_COMMUNITY): Admission: RE | Admit: 2013-08-22 | Payer: BC Managed Care – PPO | Source: Ambulatory Visit

## 2013-08-23 ENCOUNTER — Encounter (HOSPITAL_COMMUNITY): Payer: Self-pay | Admitting: Emergency Medicine

## 2013-08-23 ENCOUNTER — Emergency Department (HOSPITAL_COMMUNITY)
Admission: EM | Admit: 2013-08-23 | Discharge: 2013-08-23 | Disposition: A | Discharge status: Home / Self Care | Payer: BC Managed Care – PPO | Attending: Emergency Medicine | Admitting: Emergency Medicine

## 2013-08-23 DIAGNOSIS — I889 Nonspecific lymphadenitis, unspecified: Secondary | ICD-10-CM | POA: Insufficient documentation

## 2013-08-23 DIAGNOSIS — Z79899 Other long term (current) drug therapy: Secondary | ICD-10-CM | POA: Insufficient documentation

## 2013-08-23 DIAGNOSIS — IMO0001 Reserved for inherently not codable concepts without codable children: Secondary | ICD-10-CM | POA: Insufficient documentation

## 2013-08-23 DIAGNOSIS — N6019 Diffuse cystic mastopathy of unspecified breast: Secondary | ICD-10-CM | POA: Insufficient documentation

## 2013-08-23 DIAGNOSIS — Z87891 Personal history of nicotine dependence: Secondary | ICD-10-CM | POA: Insufficient documentation

## 2013-08-23 DIAGNOSIS — O9989 Other specified diseases and conditions complicating pregnancy, childbirth and the puerperium: Secondary | ICD-10-CM

## 2013-08-23 DIAGNOSIS — O909 Complication of the puerperium, unspecified: Principal | ICD-10-CM

## 2013-08-23 DIAGNOSIS — O26899 Other specified pregnancy related conditions, unspecified trimester: Secondary | ICD-10-CM | POA: Insufficient documentation

## 2013-08-23 DIAGNOSIS — M539 Dorsopathy, unspecified: Secondary | ICD-10-CM

## 2013-08-23 DIAGNOSIS — M899 Disorder of bone, unspecified: Secondary | ICD-10-CM | POA: Insufficient documentation

## 2013-08-23 DIAGNOSIS — M259 Joint disorder, unspecified: Secondary | ICD-10-CM | POA: Insufficient documentation

## 2013-08-23 LAB — CBC WITH DIFFERENTIAL/PLATELET
BASOS PCT: 0 % (ref 0–1)
Basophils Absolute: 0 10*3/uL (ref 0.0–0.1)
Eosinophils Absolute: 0.3 10*3/uL (ref 0.0–0.7)
Eosinophils Relative: 5 % (ref 0–5)
HCT: 30.8 % — ABNORMAL LOW (ref 36.0–46.0)
HEMOGLOBIN: 10.9 g/dL — AB (ref 12.0–15.0)
Lymphocytes Relative: 21 % (ref 12–46)
Lymphs Abs: 1.2 10*3/uL (ref 0.7–4.0)
MCH: 29.3 pg (ref 26.0–34.0)
MCHC: 35.4 g/dL (ref 30.0–36.0)
MCV: 82.8 fL (ref 78.0–100.0)
MONOS PCT: 6 % (ref 3–12)
Monocytes Absolute: 0.3 10*3/uL (ref 0.1–1.0)
NEUTROS ABS: 3.9 10*3/uL (ref 1.7–7.7)
Neutrophils Relative %: 68 % (ref 43–77)
PLATELETS: 265 10*3/uL (ref 150–400)
RBC: 3.72 MIL/uL — AB (ref 3.87–5.11)
RDW: 13.6 % (ref 11.5–15.5)
WBC: 5.6 10*3/uL (ref 4.0–10.5)

## 2013-08-23 MED ORDER — CEPHALEXIN 500 MG PO CAPS: 500.0000 mg | ORAL_CAPSULE | Freq: Four times a day (QID) | ORAL | Status: DC

## 2013-08-23 NOTE — ED Notes (Signed)
Pt is 5 days post-partum. Developed pain and swelling left axilla yesterday.

## 2013-08-23 NOTE — ED Notes (Signed)
Pt states her pain is "other pain" not left axilla pain.

## 2013-08-23 NOTE — ED Provider Notes (Signed)
CSN: [id number]     Arrival date & time 08/23/13  0924 History  This chart was scribed for non-physician practitioner, Margarita Mail, PA-C working with Orlie Dakin, MD by Frederich Balding, ED scribe. This patient was seen in room TR09C/TR09C and the patient's care was started at 10:30 AM.   Chief Complaint  Patient presents with  . Abscess   The history is provided by the patient. No language interpreter was used.   HPI Comments: Brittany Cherry is a 43 y.o. female who presents to the Emergency Department complaining of a few bumps to her left axilla. Pt is unsure of how long they have been there but she is having associated mild, left breast pain. She states she gave birth 5 days ago and her milk just came in two days ago. Pt is not currently breastfeeding. She also had a tubal ligation at the time she gave birth.   Past Medical History  Diagnosis Date  . Hyperemesis gravidarum   . AMA (advanced maternal age) multigravida 35+    Past Surgical History  Procedure Laterality Date  . Dilation and curettage of uterus    . Wisdom tooth extraction    . Tubal ligation Bilateral 08/18/2013    Procedure: POST PARTUM TUBAL LIGATION;  Surgeon: Florian Buff, MD;  Location: [facility];  Service: Gynecology;  Laterality: Bilateral;   History reviewed. No pertinent family history. History  Substance Use Topics  . Smoking status: Former Research scientist (life sciences)  . Smokeless tobacco: Never Used  . Alcohol Use: No   OB History   Grav Para Term Preterm Abortions TAB SAB Ect Mult Living   8 4 4  4 2 2   4      Review of Systems  Constitutional: Negative for fever.  HENT: Negative for congestion.   Eyes: Negative for redness.  Respiratory: Negative for shortness of breath.   Cardiovascular: Negative for chest pain.  Gastrointestinal: Negative for abdominal distention.  Musculoskeletal: Positive for myalgias.  Skin: Negative for rash.  Neurological: Negative for speech difficulty.  Psychiatric/Behavioral:  Negative for confusion.   Allergies  Review of patient's allergies indicates no known allergies.  Home Medications   Prior to Admission medications   Medication Sig Start Date End Date Taking? Authorizing Provider  acetaminophen (TYLENOL) 325 MG tablet Take 650 mg by mouth every 6 (six) hours as needed for mild pain or headache.     Historical Provider, MD  docusate sodium (COLACE) 100 MG capsule Take 1 capsule (100 mg total) by mouth 2 (two) times daily. 08/20/13   [name], DO  ibuprofen (ADVIL,MOTRIN) 600 MG tablet Take 1 tablet (600 mg total) by mouth every 6 (six) hours. 08/20/13   [name], DO  oxyCODONE-acetaminophen (PERCOCET/ROXICET) 5-325 MG per tablet Take 1-2 tablets by mouth every 4 (four) hours as needed for severe pain (moderate - severe pain). 08/20/13   [name], DO  Prenatal Vit-Min-FA-Fish Oil ([employer] PRENATAL GUMMY PO) Take 2 tablets by mouth daily.    Historical Provider, MD   BP 147/72  Pulse 55  Temp(Src) 98.6 F (37 C) (Oral)  Resp 16  Ht 5\' 2"  (1.575 m)  Wt 200 lb (90.719 kg)  BMI 36.57 kg/m2  SpO2 100%  LMP 11/15/2012  Physical Exam  Nursing note and vitals reviewed. Constitutional: She is oriented to person, place, and time. She appears well-developed and well-nourished. No distress.  HENT:  Head: Normocephalic and atraumatic.  Eyes: EOM are normal.  Neck: Neck supple. No tracheal deviation present.  Cardiovascular: Normal rate, regular rhythm and normal heart sounds.   Pulmonary/Chest: Effort normal and breath sounds normal. No respiratory distress. She has no wheezes. She has no rhonchi. She has no rales.  Musculoskeletal: Normal range of motion.  Expressible breast milk bilaterally. Breasts are firm. Mild fibrocystic breasts. Tender bilaterally, left worse than right. No skin retractions or abnormalities.   Lymphadenopathy:  Left axilla with markedly enlarged lymphadenopathy. Lymph nodes are tender to palpation. No  other signs of infection in arms, neck or head. No epitrochlear or supraclavicular lymphadenopathy.   Neurological: She is alert and oriented to person, place, and time.  Skin: Skin is warm and dry.  Psychiatric: She has a normal mood and affect. Her behavior is normal.    ED Course  Procedures (including critical care time)  DIAGNOSTIC STUDIES: Oxygen Saturation is 100% on RA, normal by my interpretation.    COORDINATION OF CARE: 10:33 AM-Discussed treatment plan which includes blood work with pt at bedside and pt agreed to plan.   Labs Review Labs Reviewed  CBC WITH DIFFERENTIAL - Abnormal; Notable for the following:    RBC 3.72 (*)    Hemoglobin 10.9 (*)    HCT 30.8 (*)    All other components within normal limits    Imaging Review No results found.   EKG Interpretation None      MDM   Final diagnoses:  None    Patient with left axillary lymphadenopathy. It is she is status post vaginal delivery of a child 5 days ago. No apparent mastitis obtain a CBC. 2 rule out possible elevation in her white count from either infection or developing leukemia. She has no other lymph nodes which are small in on examination. There is no apparent mastitis    11:47 AM The patient's CBC shows a mild anemia which is normal after delivery. Patient will be discharged with Keflex. Advised warm compresses and followup with either the health department or her PCP within the next week. Advised on return precautions. Patient expressed her understanding and agrees with plan of care.  I personally performed the services described in this documentation, which was scribed in my presence. The recorded information has been reviewed and is accurate.  Margarita Mail, PA-C 08/23/13 1148

## 2013-08-23 NOTE — Discharge Instructions (Signed)
Please take all of your antibiotics until finished!   You may develop abdominal discomfort or diarrhea from the antibiotic.  You may help offset this with probiotics which you can buy or get in yogurt. Do not eat  or take the probiotics until 2 hours after your antibiotic.  You may have a developing breast infection. Pleas apply warm compresses to the Breast and armpit.  Please follow up this [facility], or with the health department.   Swollen Lymph Nodes The lymphatic system filters fluid from around cells. It is like a system of blood vessels. These channels carry lymph instead of blood. The lymphatic system is an important part of the immune (disease fighting) system. When people talk about "swollen glands in the neck," they are usually talking about swollen lymph nodes. The lymph nodes are like the little traps for infection. You and your caregiver may be able to feel lymph nodes, especially swollen nodes, in these common areas: the groin (inguinal area), armpits (axilla), and above the clavicle (supraclavicular). You may also feel them in the neck (cervical) and the back of the head just above the hairline (occipital). Swollen glands occur when there is any condition in which the body responds with an allergic type of reaction. For instance, the glands in the neck can become swollen from insect bites or any type of minor infection on the head. These are very noticeable in children with only minor problems. Lymph nodes may also become swollen when there is a tumor or problem with the lymphatic system, such as Hodgkin's disease. TREATMENT   Most swollen glands do not require treatment. They can be observed (watched) for a short period of time, if your caregiver feels it is necessary. Most of the time, observation is not necessary.  Antibiotics (medicines that kill germs) may be prescribed by your caregiver. Your caregiver may prescribe these if he or she feels  the swollen glands are due to a bacterial (germ) infection. Antibiotics are not used if the swollen glands are caused by a virus. HOME CARE INSTRUCTIONS   Take medications as directed by your caregiver. Only take over-the-counter or prescription medicines for pain, discomfort, or fever as directed by your caregiver. SEEK MEDICAL CARE IF:   If you begin to run a temperature greater than 102 F (38.9 C), or as your caregiver suggests. MAKE SURE YOU:   Understand these instructions.  Will watch your condition.  Will get help right away if you are not doing well or get worse. Document Released: 03/18/2002 Document Revised: 06/20/2011 Document Reviewed: 03/28/2005 [facility] Patient Information 2014 [place].

## 2013-08-23 NOTE — ED Provider Notes (Signed)
Medical screening examination/treatment/procedure(s) were performed by non-physician practitioner and as supervising physician I was immediately available for consultation/collaboration.   EKG Interpretation None       Orlie Dakin, MD 08/23/13 1625

## 2013-08-23 NOTE — ED Notes (Signed)
Pt noticed painful lumps under her L armpit last night. She delivered a baby on Sunday and states her milk came in yesterday but she is not breastfeeding.

## 2013-09-11 ENCOUNTER — Encounter: Payer: Self-pay | Admitting: Obstetrics and Gynecology

## 2013-09-11 ENCOUNTER — Ambulatory Visit (INDEPENDENT_AMBULATORY_CARE_PROVIDER_SITE_OTHER): Payer: BC Managed Care – PPO | Admitting: Obstetrics and Gynecology

## 2013-09-11 NOTE — Progress Notes (Signed)
  Subjective:     Brittany Cherry is a 43 y.o. female who presents for a postpartum visit. She is 4 weeks postpartum following a spontaneous vaginal delivery. I have fully reviewed the prenatal and intrapartum course. The delivery was at 56 gestational weeks. Outcome: spontaneous vaginal delivery. Anesthesia: epidural. Postpartum course has been uncomplicated. Baby's course has been uncomplicated. Baby is feeding by bottle - formula. Bleeding thin lochia. Bowel function is normal. Bladder function is normal. Patient is not sexually active. Contraception method is tubal ligation. Postpartum depression screening: negative.     Review of Systems A comprehensive review of systems was negative.   Objective:    BP 129/86  Pulse 74  Temp(Src) 97.4 F (36.3 C)  Wt 203 lb 1.6 oz (92.126 kg)  Breastfeeding? No  General:  alert, cooperative and no distress   Breasts:  inspection negative, no nipple discharge or bleeding, no masses or nodularity palpable  Lungs: clear to auscultation bilaterally  Heart:  regular rate and rhythm  Abdomen: soft, non-tender; bowel sounds normal; no masses,  no organomegaly   Vulva:  normal  Vagina: normal vagina, no discharge, exudate, lesion, or erythema  Cervix:  multiparous appearance and no cervical motion tenderness  Corpus: normal size, contour, position, consistency, mobility, non-tender  Adnexa:  normal adnexa and no mass, fullness, tenderness  Rectal Exam: Not performed.        Assessment:     Normal postpartum exam. Pap smear not done at today's visit.   Plan:    1. Contraception: tubal ligation 2. Follow up with health department or PCP for annual exam 3. Follow up in:  october 2015 for annual exam or as needed.

## 2014-02-10 ENCOUNTER — Encounter: Payer: Self-pay | Admitting: Obstetrics and Gynecology

## 2014-02-28 ENCOUNTER — Emergency Department (HOSPITAL_COMMUNITY): Payer: Self-pay

## 2014-02-28 ENCOUNTER — Emergency Department (HOSPITAL_COMMUNITY)
Admission: EM | Admit: 2014-02-28 | Discharge: 2014-02-28 | Disposition: A | Discharge status: Home / Self Care | Payer: Self-pay | Attending: Emergency Medicine | Admitting: Emergency Medicine

## 2014-02-28 ENCOUNTER — Encounter (HOSPITAL_COMMUNITY): Payer: Self-pay | Admitting: *Deleted

## 2014-02-28 DIAGNOSIS — R079 Chest pain, unspecified: Secondary | ICD-10-CM | POA: Insufficient documentation

## 2014-02-28 DIAGNOSIS — R42 Dizziness and giddiness: Secondary | ICD-10-CM | POA: Insufficient documentation

## 2014-02-28 DIAGNOSIS — Z87891 Personal history of nicotine dependence: Secondary | ICD-10-CM | POA: Insufficient documentation

## 2014-02-28 LAB — COMPREHENSIVE METABOLIC PANEL
ALBUMIN: 4.1 g/dL (ref 3.5–5.2)
ALT: 16 U/L (ref 0–35)
AST: 30 U/L (ref 0–37)
Alkaline Phosphatase: 81 U/L (ref 39–117)
Anion gap: 16 — ABNORMAL HIGH (ref 5–15)
BUN: 14 mg/dL (ref 6–23)
CALCIUM: 9.5 mg/dL (ref 8.4–10.5)
CO2: 20 meq/L (ref 19–32)
CREATININE: 0.69 mg/dL (ref 0.50–1.10)
Chloride: 99 mEq/L (ref 96–112)
GFR calc Af Amer: >90 mL/min (ref >90)
Glucose, Bld: 88 mg/dL (ref 70–99)
Potassium: 5.2 mEq/L (ref 3.7–5.3)
SODIUM: 135 meq/L — AB (ref 137–147)
TOTAL PROTEIN: 7.8 g/dL (ref 6.0–8.3)
Total Bilirubin: 0.3 mg/dL (ref 0.3–1.2)

## 2014-02-28 LAB — CBC WITH DIFFERENTIAL/PLATELET
Basophils Absolute: 0 10*3/uL (ref 0.0–0.1)
Basophils Relative: 0 % (ref 0–1)
EOS ABS: 0.1 10*3/uL (ref 0.0–0.7)
Eosinophils Relative: 1 % (ref 0–5)
HCT: 34.6 % — ABNORMAL LOW (ref 36.0–46.0)
Hemoglobin: 12.2 g/dL (ref 12.0–15.0)
Lymphocytes Relative: 19 % (ref 12–46)
Lymphs Abs: 1.1 10*3/uL (ref 0.7–4.0)
MCH: 28.1 pg (ref 26.0–34.0)
MCHC: 35.3 g/dL (ref 30.0–36.0)
MCV: 79.7 fL (ref 78.0–100.0)
Monocytes Absolute: 0.3 10*3/uL (ref 0.1–1.0)
Monocytes Relative: 5 % (ref 3–12)
NEUTROS PCT: 75 % (ref 43–77)
Neutro Abs: 4.3 10*3/uL (ref 1.7–7.7)
PLATELETS: 250 10*3/uL (ref 150–400)
RBC: 4.34 MIL/uL (ref 3.87–5.11)
RDW: 12.8 % (ref 11.5–15.5)
WBC: 5.9 10*3/uL (ref 4.0–10.5)

## 2014-02-28 LAB — I-STAT BETA HCG BLOOD, ED (MC, WL, AP ONLY)

## 2014-02-28 LAB — I-STAT TROPONIN, ED: TROPONIN I, POC: 0 ng/mL (ref 0.00–0.08)

## 2014-02-28 MED ORDER — SODIUM CHLORIDE 0.9 % IV BOLUS (SEPSIS)
1000.0000 mL | Freq: Once | INTRAVENOUS | Status: AC
  Administered 2014-02-28: 1000 mL via INTRAVENOUS

## 2014-02-28 MED ORDER — ASPIRIN 81 MG PO CHEW
324.0000 mg | CHEWABLE_TABLET | Freq: Once | ORAL | Status: AC
  Administered 2014-02-28: 324 mg via ORAL
  Filled 2014-02-28: qty 4

## 2014-02-28 MED ORDER — LORAZEPAM 2 MG/ML IJ SOLN
1.0000 mg | Freq: Once | INTRAMUSCULAR | Status: AC
  Administered 2014-02-28: 1 mg via INTRAVENOUS
  Filled 2014-02-28: qty 1

## 2014-02-28 MED ORDER — HYDROXYZINE HCL 25 MG PO TABS: 25.0000 mg | ORAL_TABLET | Freq: Four times a day (QID) | ORAL | Status: DC

## 2014-02-28 NOTE — ED Provider Notes (Signed)
Patient is a 43 year old female, presents with some dizziness, some chest pain. On exam she has normal heart and lung sounds, strong peripheral pulses and no peripheral edema, no JVD. She has no increased vertigo with range of motion of the head at this time, she has been improved since arrival.  Labs EKG unremarkable thus far, check urine pregnancy, anticipate discharge with symptomatic medications.  Medical screening examination/treatment/procedure(s) were conducted as a shared visit with non-physician practitioner(s) and myself.  I personally evaluated the patient during the encounter.  Clinical Impression:   Final diagnoses:  Chest pain  Vertigo         Johnna Acosta, MD 02/28/14 2106

## 2014-02-28 NOTE — Discharge Instructions (Signed)
Do not hesitate to return to the emergency room for any new, worsening or concerning symptoms. ° °Please obtain primary care using resource guide below. But the minute you were seen in the emergency room and that they will need to obtain records for further outpatient management. ° ° ° °Chest Pain (Nonspecific) °It is often hard to give a specific diagnosis for the cause of chest pain. There is always a chance that your pain could be related to something serious, such as a heart attack or a blood clot in the lungs. You need to follow up with your health care provider for further evaluation. °CAUSES  °· Heartburn. °· Pneumonia or bronchitis. °· Anxiety or stress. °· Inflammation around your heart (pericarditis) or lung (pleuritis or pleurisy). °· A blood clot in the lung. °· A collapsed lung (pneumothorax). It can develop suddenly on its own (spontaneous pneumothorax) or from trauma to the chest. °· Shingles infection (herpes zoster virus). °The chest wall is composed of bones, muscles, and cartilage. Any of these can be the source of the pain. °· The bones can be bruised by injury. °· The muscles or cartilage can be strained by coughing or overwork. °· The cartilage can be affected by inflammation and become sore (costochondritis). °DIAGNOSIS  °Lab tests or other studies may be needed to find the cause of your pain. Your health care provider may have you take a test called an ambulatory electrocardiogram (ECG). An ECG records your heartbeat patterns over a 24-hour period. You may also have other tests, such as: °· Transthoracic echocardiogram (TTE). During echocardiography, sound waves are used to evaluate how blood flows through your heart. °· Transesophageal echocardiogram (TEE). °· Cardiac monitoring. This allows your health care provider to monitor your heart rate and rhythm in real time. °· Holter monitor. This is a portable device that records your heartbeat and can help diagnose heart arrhythmias. It allows  your health care provider to track your heart activity for several days, if needed. °· Stress tests by exercise or by giving medicine that makes the heart beat faster. °TREATMENT  °· Treatment depends on what may be causing your chest pain. Treatment may include: °¨ Acid blockers for heartburn. °¨ Anti-inflammatory medicine. °¨ Pain medicine for inflammatory conditions. °¨ Antibiotics if an infection is present. °· You may be advised to change lifestyle habits. This includes stopping smoking and avoiding alcohol, caffeine, and chocolate. °· You may be advised to keep your head raised (elevated) when sleeping. This reduces the chance of acid going backward from your stomach into your esophagus. °Most of the time, nonspecific chest pain will improve within 2-3 days with rest and mild pain medicine.  °HOME CARE INSTRUCTIONS  °· If antibiotics were prescribed, take them as directed. Finish them even if you start to feel better. °· For the next few days, avoid physical activities that bring on chest pain. Continue physical activities as directed. °· Do not use any tobacco products, including cigarettes, chewing tobacco, or electronic cigarettes. °· Avoid drinking alcohol. °· Only take medicine as directed by your health care provider. °· Follow your health care provider's suggestions for further testing if your chest pain does not go away. °· Keep any follow-up appointments you made. If you do not go to an appointment, you could develop lasting (chronic) problems with pain. If there is any problem keeping an appointment, call to reschedule. °SEEK MEDICAL CARE IF:  °· Your chest pain does not go away, even after treatment. °· You have a   rash with blisters on your chest. °· You have a fever. °SEEK IMMEDIATE MEDICAL CARE IF:  °· You have increased chest pain or pain that spreads to your arm, neck, jaw, back, or abdomen. °· You have shortness of breath. °· You have an increasing cough, or you cough up blood. °· You have  severe back or abdominal pain. °· You feel nauseous or vomit. °· You have severe weakness. °· You faint. °· You have chills. °This is an emergency. Do not wait to see if the pain will go away. Get medical help at once. Call your local emergency services (911 in [place] Do not drive yourself to the hospital. °MAKE SURE YOU:  °· Understand these instructions. °· Will watch your condition. °· Will get help right away if you are not doing well or get worse. °Document Released: 01/05/2005 Document Revised: 04/02/2013 Document Reviewed: 11/01/2007 °ExitCare® Patient Information ©2015 [employer]. This information is not intended to replace advice given to you by your health care provider. Make sure you discuss any questions you have with your health care provider. ° ° °Emergency Department Resource Guide °1) Find a Doctor and Pay Out of Pocket °Although you won't have to find out who is covered by your insurance plan, it is a good idea to ask around and get recommendations. You will then need to call the office and see if the doctor you have chosen will accept you as a new patient and what types of options they offer for patients who are self-pay. Some doctors offer discounts or will set up payment plans for their patients who do not have insurance, but you will need to ask so you aren't surprised when you get to your appointment. ° °2) Contact Your Local Health Department °Not all health departments have doctors that can see patients for sick visits, but many do, so it is worth a call to see if yours does. If you don't know where your local health department is, you can check in your phone book. The CDC also has a tool to help you locate your state's health department, and many state websites also have listings of all of their local health departments. ° °3) Find a Walk-in Clinic °If your illness is not likely to be very severe or complicated, you may want to try a walk in clinic. These are popping up all over the  country in pharmacies, drugstores, and shopping centers. They're usually staffed by nurse practitioners or physician assistants that have been trained to treat common illnesses and complaints. They're usually fairly quick and inexpensive. However, if you have serious medical issues or chronic medical problems, these are probably not your best option. ° °No Primary Care Doctor: °- Call [facility] at  832-8000 - they can help you locate a primary care doctor that  accepts your insurance, provides certain services, etc. °- Physician Referral Service- 1-800-533-3463 ° °Chronic Pain Problems: °Organization         Address  Phone   Notes  °[facility]  (336) 297-2271 Patients need to be referred by their primary care doctor.  ° °Medication Assistance: °Organization         Address  Phone   Notes  °[place] [facility] [address] °[place], [place] [address] (336) 641-8030 --Must be a resident of [place] °-- Must have NO insurance coverage whatsoever (no Medicaid/ Medicare, etc.) °-- The pt. MUST have a primary care doctor that directs their care   regularly and follows them in the community °  °[facility]  (866) 331-1348   °[employer]  (888) 892-1162   ° °Agencies that provide inexpensive medical care: °Organization         Address  Phone   Notes  °[facility]  (336) 832-8035   °[facility]    (336) 832-7272   °[facility] [address] °[place], [place] [address] (336) 832-4777   °[facility] [address], °[place] (336) 271-4999   °[facility]    (336) 373-0678   °[facility]    (336) 272-1050   °[facility] ° [address], [place] Phone:  (336) 832-4444, Fax:  (336) 832-4440 Hours of Operation:  9 am - 6 pm, M-F.  Also accepts Medicaid/Medicare and self-pay.  °[facility] ° [address], [place] Phone: (336) 832-3150, Fax: (336) [phone]. Hours of Operation:  8:30 am - 5:30 pm, M-F.  Also accepts Medicaid and self-pay.  °[facility] [place] [address], [place] Phone: (336) 878-6027   °[facility] [address], [place], [place] (336)723-1848, Ext. 123 Mondays & Thursdays: 7-9 AM.  First 15 patients are seen on a first come, first serve basis. °  ° °Medicaid-accepting [place] Providers: ° °Organization         Address  Phone   Notes  °[facility] [address], [place] (336) 641-2100 Also accepts self-pay patients.  °[facility] [address], [place] ° (336) 856-9996   °[facility] [address], [place] (336) 288-8857   °[facility] [address] [place] [address], [place] (336) 299-7000   °Veita Bland [address], [place]  ° (336) 373-1557 Only accepts [place] Access Medicaid patients after they have their name applied to their card.  ° °Self-Pay (no insurance) in [place]: ° °Organization         Address  Phone   Notes  °Sickle Cell Patients, [facility] [address], [place] (336) 832-1970   °[facility] [address], [place] (336) 832-4400   °[facility] ° [address], [place] (336) 992-4800   °[facility]/Dr. Osei-Bonsu ° [address] [place] [address], [place] or [address], [place] (336) [phone] Phone number for both [place] and [place] locations is the same.  °[facility] [address], [place] (336) 299-0000   °[facility] [address] [place] [address], [place] or [address] (336) 852-7530 °(336) 878-2260   °[facility] [address], [place] (336) 350-1642, phone; (336) 294-5005, fax Sees patients 1st and 3rd Saturday of every month.  Must  not qualify for public or private insurance (i.e. Medicaid, Medicare, [place] Health Choice, Veterans' Benefits) • Household income should be no more than 200% of the poverty level •The clinic cannot treat you if you are pregnant or think you are pregnant • Sexually transmitted diseases are not treated at the clinic.  ° ° °Dental Care: °Organization         Address  Phone  Notes  °[facility] [address],  (336) 641-6152 Accepts children up to age 21   who are enrolled in Medicaid or [place] Health Choice; pregnant women with a Medicaid card; and children who have applied for Medicaid or [place] Health Choice, but were declined, whose parents can pay a reduced fee at time of service.  °[facility] [place]  [address], [place] (336) 641-7733 Accepts children up to age 21 who are enrolled in Medicaid or [place] Health Choice; pregnant women with a Medicaid card; and children who have applied for Medicaid or [place] Health Choice, but were declined, whose parents can pay a reduced fee at time of service.  °[facility] ° [address], [place] (336) 641-4533 Patients are seen by appointment only. Walk-ins are not accepted. [facility] will see patients 18 years of age and older. °Monday - Tuesday (8am-5pm) °Most Wednesdays (8:30-5pm) °$30 per visit, cash only  °[facility] ° [address], [place] (336) 641-4533 Patients are seen by appointment only. Walk-ins are not accepted. [facility] will see patients 18 years of age and older. °One Wednesday Evening (Monthly: Volunteer Based).  $30 per visit, cash only  °[facility]  (919) 537-3737 for adults; Children under age 4, call Graduate Pediatric Dentistry at ([phone]) [phone]. Children aged 4-14, please call ([phone]) [phone] to request a pediatric application. ° Dental services are  provided in all areas of dental care including fillings, crowns and bridges, complete and partial dentures, implants, gum treatment, root canals, and extractions. Preventive care is also provided. Treatment is provided to both adults and children. °Patients are selected via a lottery and there is often a waiting list. °  °[facility] [address], °[place] ° (336) 763-8833 [email or web address] °  °[facility] [address], [place], [place] (336)723-1848, Ext. 123 Second and Fourth Thursday of each month, opens at 6:30 AM; Clinic ends at 9 AM.  Patients are seen on a first-come first-served basis, and a limited number are seen during each clinic.  ° °[facility] ° [address], [place], [place] (336) 723-7904   Eligibility Requirements °You must have lived in [place], [place], or [place] counties for at least the last three months. °  You cannot be eligible for state or federal sponsored healthcare insurance, including Veterans Administration, Medicaid, or Medicare. °  You generally cannot be eligible for healthcare insurance through your employer.  °  How to apply: °Eligibility screenings are held every Tuesday and Wednesday afternoon from 1:00 pm until 4:00 pm. You do not need an appointment for the interview!  °[facility] [address], [place], [place] 336-631-2330   °[facility]  336-342-8273   °[facility]  336-703-3100   °[facility]  336-570-6415   ° °Behavioral Health Resources in the Community: °Intensive Outpatient Programs °Organization         Address  Phone  Notes  °[facility] [address], [place], [place] 336-878-6098   °[facility] [address], [place], [place] 336-832-9800   °[facility] [address], [place], [place] ° 336-882-2125   °[facility] [address],  °[place], [place]  1-800-853-5163 or 336-641-4981   °Substance Abuse Resources °Organization         Address  Phone  Notes  °[facility]  336-882-2125   °[facility]    336-784-9470   °[facility]  336-285-9073   °[facility]  336-845-3988   °Residential & Outpatient Substance Abuse Program  1-800-659-3381   °Psychological Services °Organization         Address  Phone  Notes  °[facility]  336- 832-9600   °[facility]  336- 378-7881   °[facility] [address], [place] 1-800-853-5163 or 336-641-4981   ° °Mobile Crisis Teams °Organization         Address  Phone  Notes  °[facility], Mobile Crisis Care Unit  1-877-626-1772   °Assertive °[facility] ° [address] [place], [place] 336-834-9664   °Sharon DeEsch [address] °[place] 336-554-5454   ° °Self-Help/Support Groups °Organization         Address  Phone             Notes  °[facility] - variety of support groups  336- 373-1402 Call for more information  °Narcotics Anonymous (NA), [facility] [address], °[place]  2 meetings at this location  ° °Residential Treatment Programs °Organization         Address  Phone  Notes  °[facility] [address],    °[place]  1-866-801-8205   °[facility] ° [address], [place], [place] 704-293-8524   °[facility] [address], [place] 336-845-3988 Admissions: 8am-3pm M-F  °[facility] [address]    °[place], [place] 336-841-1104   °[facility] [address], [place], [place] 336-379-7146   °[facility] [address]  °[place], [place] 336-285-9073   °[facility] Intensive Outpatient [address], [place], [place] 336-852-3033   °[facility] [address]  °[place], [place] 1-877-615-2722 or  336-784-9470   °[facility] ([facility] [address] [place], [place] 336-227-7417 Accepts Medicaid  °[facility] [address]  °[place] 1-800-659-3381 Substance Abuse/Addiction Treatment  ° °[place] Behavioral Health Resources °Organization         Address  Phone  Notes  °[facility]  (888) 581-9988   °Julie Brannon, PhD [address] [place], [place]   (336) 349-5553 or (336) 951-0000   °[facility]   [address] °[place], [place] (336) 349-4454   °[facility] [address], [place], [place] (336) 342-8316 Insurance/Medicaid/sponsorship through [facility]  °[facility] [address]                                    [place], [place] (336) 342-8316 Therapy/tele-psych/case  °[facility] [address]  ° [place], [place] (336) 349-2233    °Dr. Arfeen  (336) 349-4544   °[facility] [place]  [employer] [place] [facility] 1) [address], [place] °2) [address], [place] °3)  [address], [place] (336) 349-3220 °(336) 342-7768 ° °(336) 342-8140   °[place] [facility] (336) 342-1394 or (336) 342-3537 (After Hours)    ° ° °' ° °

## 2014-02-28 NOTE — ED Notes (Addendum)
Pt reports onset this am 0900 of dizziness, "feels like room is spinning" and then onset of anxiety and chest pains. Reports hx of anxiety but has been off meds x 2 years, thinks her chest pain is related to anxiety. Reports the episodes of dizziness have been occuring for extended amount of time but more frequent over the past month.

## 2014-02-28 NOTE — ED Provider Notes (Signed)
CSN: [id number]     Arrival date & time 02/28/14  1009 History   First MD Initiated Contact with Patient 02/28/14 1016     Chief Complaint  Patient presents with  . Dizziness  . Chest Pain     (Consider location/radiation/quality/duration/timing/severity/associated sxs/prior Treatment) HPI   Brittany Cherry is a 43 y.o. female with no significant past medical history complaining of vertiginous sensation associated with of non-bloody, nonbilious, no coffee-ground appearing emesis onset at 9 AM today with left-sided, nonradiating chest pain described as sharp, 3 out of 10 not exacerbated by movement, palpation or exertion. States that she's been having these episodes intermittently for 3 months. States that the vertigo is exacerbated by standing, not by head movement. Patient has similar episodes or she is attributed to anxiety in the past. Patient denies headache, has a history of migraines had a headache yesterday which resolved with 2 Excedrin. Patient denies fever, chills, diarrhea, cough, change in vision, ataxia abdominal pain, CP, SOB. 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 Positive sick contact in that her infant child has upper respiratory. 25year pack history, now quit. Denies diabetes, hypertension, family history of cardiac disease, hyperlipidemia. Patient states that she feels better after coming to the ED, states that she "is obsessed with having a heart attack." Is noncompliant with her anxiety medication. Non compliant with benzos for anxiety x16 months    Past Medical History  Diagnosis Date  . Hyperemesis gravidarum   . AMA (advanced maternal age) multigravida 35+    Past Surgical History  Procedure Laterality Date  . Dilation and curettage of uterus    . Wisdom tooth extraction    . Tubal ligation Bilateral 08/18/2013    Procedure: POST PARTUM TUBAL LIGATION;  Surgeon: Florian Buff, MD;  Location: [facility];  Service: Gynecology;   Laterality: Bilateral;   History reviewed. No pertinent family history. History  Substance Use Topics  . Smoking status: Former Research scientist (life sciences)  . Smokeless tobacco: Never Used  . Alcohol Use: No   OB History    Gravida Para Term Preterm AB TAB SAB Ectopic Multiple Living   8 4 4  4 2 2   4      Review of Systems  10 systems reviewed and found to be negative, except as noted in the HPI.   Allergies  Review of patient's allergies indicates no known allergies.  Home Medications   Prior to Admission medications   Medication Sig Start Date End Date Taking? Authorizing Provider  acetaminophen (TYLENOL) 325 MG tablet Take 650 mg by mouth every 6 (six) hours as needed for mild pain or headache.    Yes Historical Provider, MD  oxyCODONE-acetaminophen (PERCOCET/ROXICET) 5-325 MG per tablet Take 1-2 tablets by mouth every 4 (four) hours as needed for severe pain (moderate - severe pain). 08/20/13  Yes [name], DO  hydrOXYzine (ATARAX/VISTARIL) 25 MG tablet Take 1 tablet (25 mg total) by mouth every 6 (six) hours. 02/28/14   Deardra Hinkley, PA-C   BP 118/71 mmHg  Pulse 65  Temp(Src) 98.1 F (36.7 C)  Resp 20  SpO2 100%  LMP 02/12/2014 (Approximate) Physical Exam  Constitutional: She is oriented to person, place, and time. She appears well-developed and well-nourished. No distress.  HENT:  Head: Normocephalic and atraumatic.  Mouth/Throat: Oropharynx is clear and moist.  Eyes: Conjunctivae and EOM are normal. Pupils are equal, round, and reactive to light.  Neck:  No midline C-spine  tenderness  to palpation or step-offs appreciated. Patient has full range of motion without pain.   Cardiovascular: Normal rate, regular rhythm and intact distal pulses.   Pulmonary/Chest: Effort normal and breath sounds normal. No stridor. No respiratory distress. She has no wheezes. She has no rales. She exhibits no tenderness.  Abdominal: Soft. Bowel sounds are normal. She exhibits no  distension and no mass. There is no rebound and no guarding.  Musculoskeletal: Normal range of motion.  Neurological: She is alert and oriented to person, place, and time.  Dix-Hallpike is negative bilaterally  Follows commands, Clear, goal oriented speech, Strength is 5 out of 5x4 extremities, patient ambulates with a coordinated in nonantalgic gait. Sensation is grossly intact.   Psychiatric: She has a normal mood and affect.  Nursing note and vitals reviewed.   ED Course  Procedures (including critical care time) Labs Review Labs Reviewed  COMPREHENSIVE METABOLIC PANEL - Abnormal; Notable for the following:    Sodium 135 (*)    Anion gap 16 (*)    All other components within normal limits  CBC WITH DIFFERENTIAL - Abnormal; Notable for the following:    HCT 34.6 (*)    All other components within normal limits  CBC WITH DIFFERENTIAL  I-STAT BETA HCG BLOOD, ED ([facility], [facility], [facility] ONLY)  I-STAT TROPOININ, ED    Imaging Review Dg Chest 2 View  02/28/2014   CLINICAL DATA:  Initial encounter for sharp left-sided chest pain beginning this morning. Some associated shortness of breath.  EXAM: CHEST  2 VIEW  COMPARISON:  None.  FINDINGS: The heart size and mediastinal contours are within normal limits. Both lungs are clear. The visualized skeletal structures are unremarkable. Telemetry leads overlie the chest.  IMPRESSION: Normal exam.   Electronically Signed   By: Misty Stanley M.D.   On: 02/28/2014 11:11     EKG Interpretation   Date/Time:  Friday February 28 2014 10:11:04 EST Ventricular Rate:  61 PR Interval:  150 QRS Duration: 90 QT Interval:  404 QTC Calculation: 406 R Axis:   57 Text Interpretation:  Normal sinus rhythm Normal ECG No old tracing to  compare Confirmed by MILLER  MD, [place] ([id number]) on 02/28/2014 10:21:51 AM      MDM   Final diagnoses:  Chest pain  Vertigo    Filed Vitals:   02/28/14 1039 02/28/14 1102 02/28/14 1136 02/28/14 1230  BP:  118/63 117/76  118/71  Pulse:  59 70 65  Temp: 98.1 F (36.7 C)     Resp:  16 21 20   SpO2:  100% 100% 100%    Medications  aspirin chewable tablet 324 mg (324 mg Oral Given 02/28/14 1117)  LORazepam (ATIVAN) injection 1 mg (1 mg Intravenous Given 02/28/14 1130)  sodium chloride 0.9 % bolus 1,000 mL (0 mLs Intravenous Stopped 02/28/14 1332)    Brittany Cherry is a 43 y.o. female presenting with vertiginous sensation and nonbloody, nonbilious emesis. Patient has been having these episodes intermittently over the last 3 months. Neuro exam is nonfocal, Dix-Hallpike is negative. Cardiac workup pending.   EKG is nonischemic, chest x-ray without infiltrate and blood work without significant abnormality. Patient is low risk by heart score. I think this is likely secondary to anxiety. Patient reports improved symptoms with Ativan. Will check delta troponin and DC to home. Not orthostatic by VS or symptoms.   Encouraged establishing primary care.   Evaluation does not show pathology that would require ongoing emergent intervention or inpatient treatment. Pt is hemodynamically stable  and mentating appropriately. Discussed findings and plan with patient/guardian, who agrees with care plan. All questions answered. Return precautions discussed and outpatient follow up given.   New Prescriptions   HYDROXYZINE (ATARAX/VISTARIL) 25 MG TABLET    Take 1 tablet (25 mg total) by mouth every 6 (six) hours.         Monico Blitz, PA-C 02/28/14 1334  Johnna Acosta, MD 02/28/14 2106

## 2014-03-13 ENCOUNTER — Encounter: Payer: Self-pay | Admitting: Obstetrics & Gynecology

## 2015-04-16 IMAGING — US US OB FOLLOW-UP
1 series · 12 of 28 positions shown · non-contrast
Comparison: none

[Series 1: us ob follow-up · 0.23mm/px · 12 of 65 slices shown]
[im 3/65]
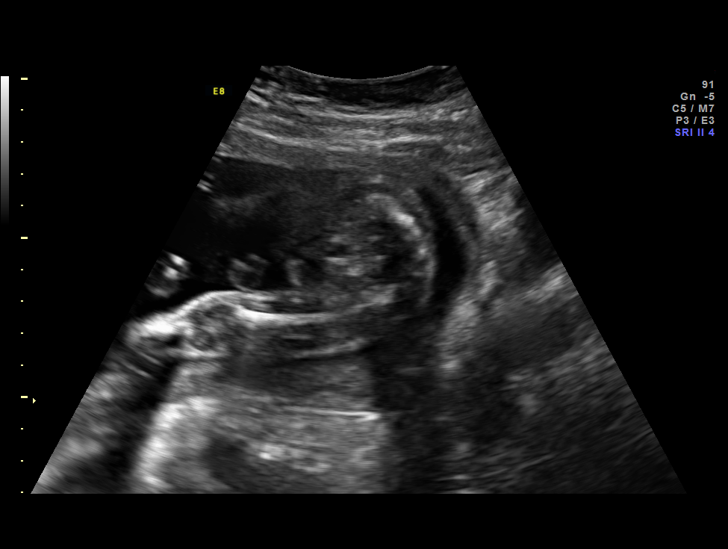
[im 8/65]
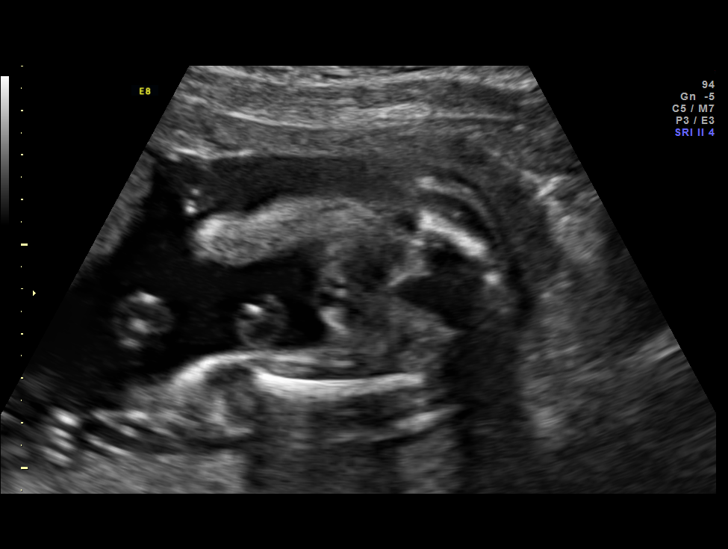
[im 12/65]
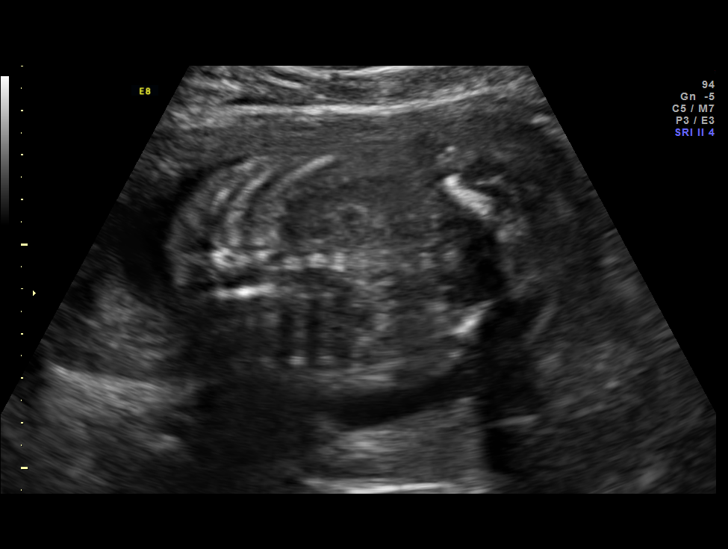
[im 19/65]
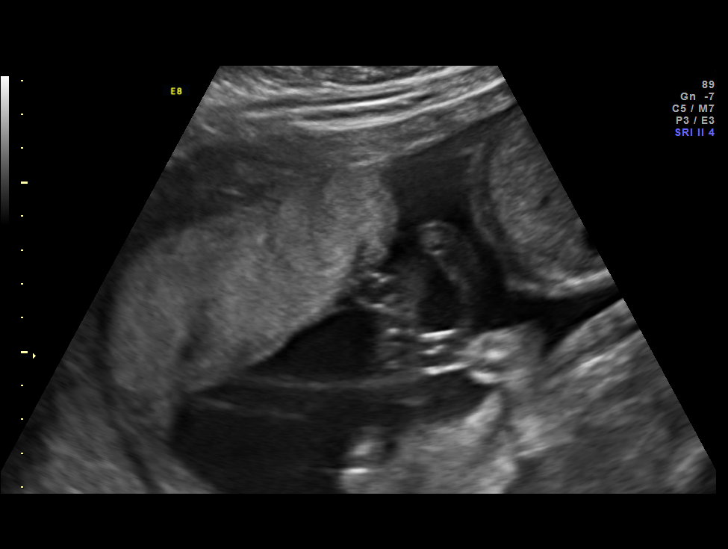
[im 24/65]
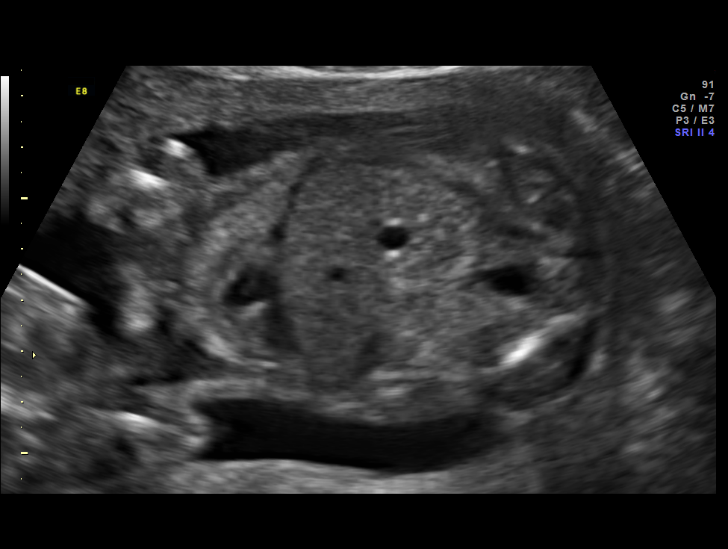
[im 29/65]
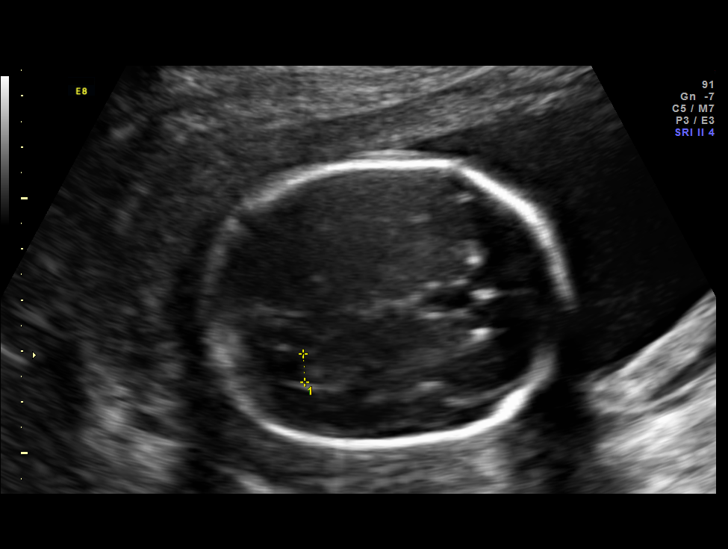
[im 36/65]
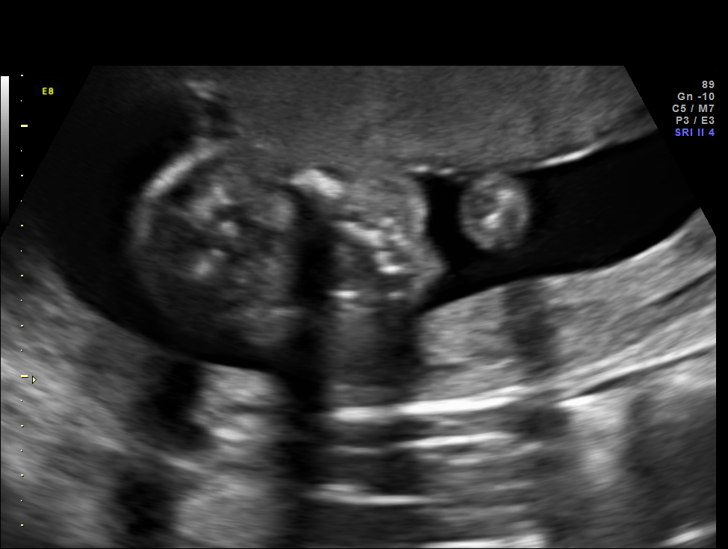
[im 41/65]
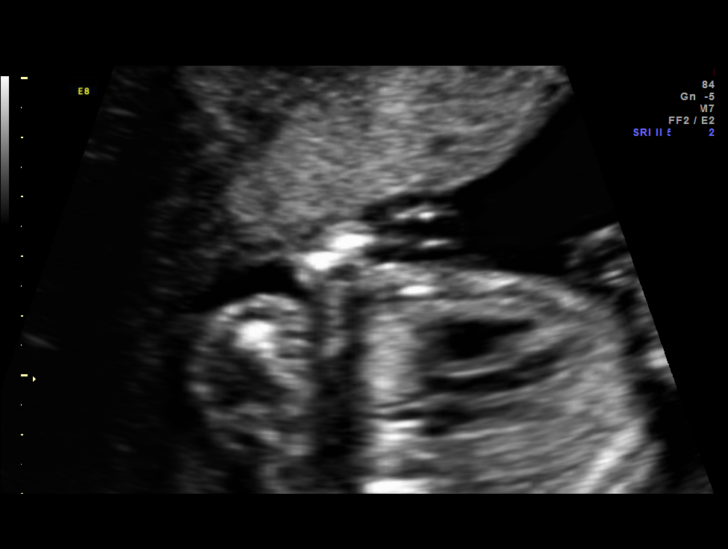
[im 46/65]
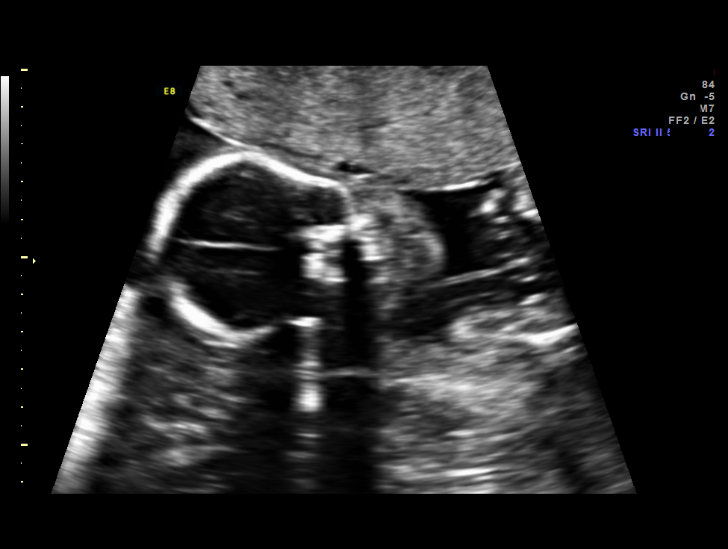
[im 53/65]
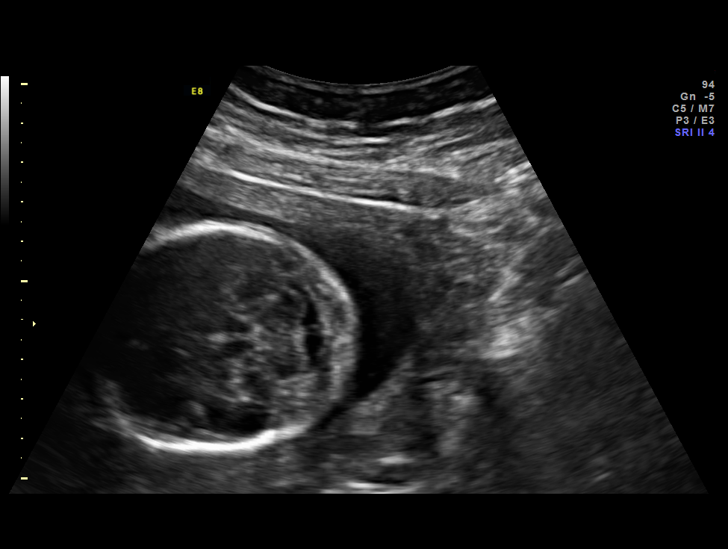
[im 57/65]
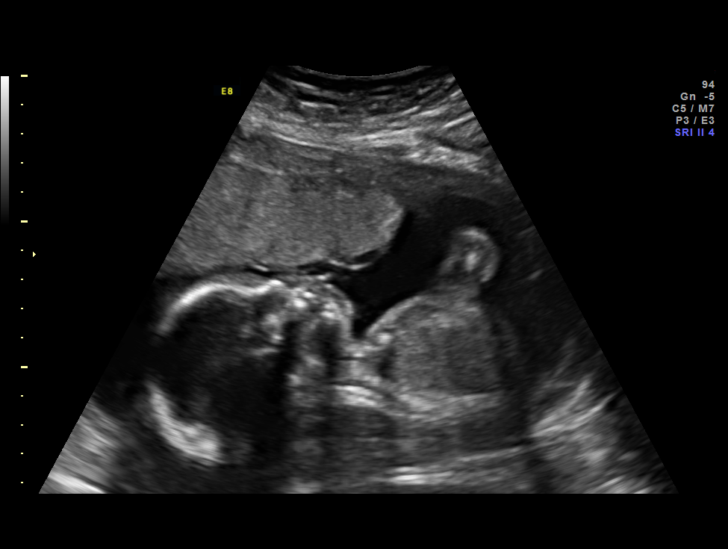
[im 62/65]
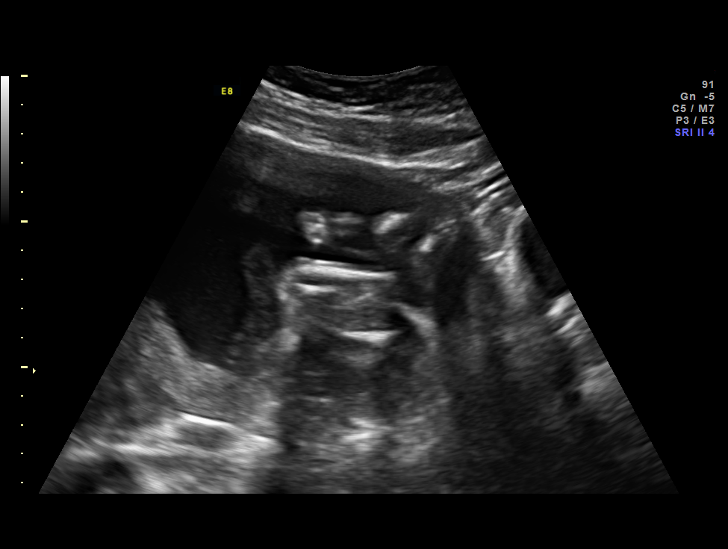

[12 of 28 positions shown; findings below may reference images not displayed]

OBSTETRICS REPORT
                      (Signed Final 04/24/2013 [DATE])

Service(s) Provided

 US OB FOLLOW UP                                       76816.1
Indications

 Advanced maternal age (AMA), Multigravida
 Follow-up incomplete fetal anatomic evaluation
Fetal Evaluation

 Num Of Fetuses:    1
 Fetal Heart Rate:  132                          bpm
 Cardiac Activity:  Observed
 Presentation:      Breech
 Placenta:          Anterior, above cervical os
 P. Cord            Previously Visualized
 Insertion:

 Amniotic Fluid
 AFI FV:      Subjectively within normal limits
                                             Larg Pckt:     4.5  cm
Biometry

 BPD:     55.9  mm     G. Age:  23w 0d                CI:         74.8   70 - 86
 OFD:     74.7  mm                                    FL/HC:      18.3   19.2 -

 HC:     209.2  mm     G. Age:  23w 0d       42  %    HC/AC:      1.08   1.05 -

 AC:     193.6  mm     G. Age:  24w 0d       78  %    FL/BPD:     68.3   71 - 87
 FL:      38.2  mm     G. Age:  22w 2d       21  %    FL/AC:      19.7   20 - 24
 HUM:     37.3  mm     G. Age:  23w 0d       47  %

 Est. FW:     575  gm      1 lb 4 oz     58  %
Gestational Age

 LMP:           22w 6d        Date:  11/15/12                 EDD:   08/22/13
 U/S Today:     23w 1d                                        EDD:   08/20/13
 Best:          22w 6d     Det. By:  LMP  (11/15/12)          EDD:   08/22/13
Anatomy

 Cranium:          Appears normal         Aortic Arch:      Appears normal
 Fetal Cavum:      Appears normal         Ductal Arch:      Not well visualized
 Ventricles:       Appears normal         Diaphragm:        Appears normal
 Choroid Plexus:   Previously seen        Stomach:          Appears normal, left
                                                            sided
 Cerebellum:       Appears normal         Abdomen:          Appears normal
 Posterior Fossa:  Appears normal         Abdominal Wall:   Appears nml (cord
                                                            insert, abd wall)
 Nuchal Fold:      Previously seen        Cord Vessels:     Appears normal (3
                                                            vessel cord)
 Face:             Appears normal         Kidneys:          Appear normal
                   (orbits and profile)
 Lips:             Appears normal         Bladder:          Appears normal
 Heart:            Appears normal         Spine:            Previously seen
                   (4CH, axis, and
                   situs)
 RVOT:             Appears normal         Lower             Previously seen
                                          Extremities:
 LVOT:             Appears normal         Upper             Previously seen
                                          Extremities:

 Other:  Male gender. Heels previously seen.
Cervix Uterus Adnexa

 Cervical Length:    4.1      cm

 Cervix:       Normal appearance by transabdominal scan. Appears
               closed, without funnelling.
Impression

 Single IUP at 22 [DATE] weeks
 Normal interval anatomy.  Somewhat limited views of the fetal
 heart obtained (ductus)
 No markers associated with aneuploidy noted
 Fetal growth is appropriate (58th %tile)
 Normal amniotic fluid volume
Recommendations

 Recommend follow-up ultrasound examination in 6 weeks for
 interval growth.

## 2015-04-20 ENCOUNTER — Emergency Department (HOSPITAL_COMMUNITY)
Admission: EM | Admit: 2015-04-20 | Discharge: 2015-04-20 | Disposition: A | Discharge status: Home / Self Care | Payer: Self-pay | Attending: Emergency Medicine | Admitting: Emergency Medicine

## 2015-04-20 ENCOUNTER — Encounter (HOSPITAL_COMMUNITY): Payer: Self-pay | Admitting: *Deleted

## 2015-04-20 ENCOUNTER — Emergency Department (HOSPITAL_COMMUNITY): Payer: Self-pay

## 2015-04-20 DIAGNOSIS — Z79899 Other long term (current) drug therapy: Secondary | ICD-10-CM | POA: Insufficient documentation

## 2015-04-20 DIAGNOSIS — Z87891 Personal history of nicotine dependence: Secondary | ICD-10-CM | POA: Insufficient documentation

## 2015-04-20 DIAGNOSIS — M546 Pain in thoracic spine: Secondary | ICD-10-CM | POA: Insufficient documentation

## 2015-04-20 DIAGNOSIS — M79602 Pain in left arm: Secondary | ICD-10-CM | POA: Insufficient documentation

## 2015-04-20 DIAGNOSIS — F419 Anxiety disorder, unspecified: Secondary | ICD-10-CM | POA: Insufficient documentation

## 2015-04-20 LAB — CBC
HCT: 30.2 % — ABNORMAL LOW (ref 36.0–46.0)
Hemoglobin: 9.7 g/dL — ABNORMAL LOW (ref 12.0–15.0)
MCH: 22.2 pg — ABNORMAL LOW (ref 26.0–34.0)
MCHC: 32.1 g/dL (ref 30.0–36.0)
MCV: 69.3 fL — ABNORMAL LOW (ref 78.0–100.0)
Platelets: 339 10*3/uL (ref 150–400)
RBC: 4.36 MIL/uL (ref 3.87–5.11)
RDW: 17 % — ABNORMAL HIGH (ref 11.5–15.5)
WBC: 5.8 10*3/uL (ref 4.0–10.5)

## 2015-04-20 LAB — BASIC METABOLIC PANEL
Anion gap: 8 (ref 5–15)
BUN: 11 mg/dL (ref 6–20)
CO2: 23 mmol/L (ref 22–32)
CREATININE: 0.82 mg/dL (ref 0.44–1.00)
Calcium: 8.6 mg/dL — ABNORMAL LOW (ref 8.9–10.3)
Chloride: 109 mmol/L (ref 101–111)
GFR calc Af Amer: >60 mL/min (ref >60)
GLUCOSE: 150 mg/dL — AB (ref 65–99)
Potassium: 3.9 mmol/L (ref 3.5–5.1)
SODIUM: 140 mmol/L (ref 135–145)

## 2015-04-20 LAB — I-STAT TROPONIN, ED
Troponin i, poc: 0 ng/mL (ref 0.00–0.08)
Troponin i, poc: 0 ng/mL (ref 0.00–0.08)

## 2015-04-20 MED ORDER — METHOCARBAMOL 500 MG PO TABS
500.0000 mg | ORAL_TABLET | Freq: Once | ORAL | Status: AC
  Administered 2015-04-20: 500 mg via ORAL
  Filled 2015-04-20: qty 1

## 2015-04-20 MED ORDER — METHOCARBAMOL 500 MG PO TABS: 500.0000 mg | ORAL_TABLET | Freq: Two times a day (BID) | ORAL | Status: DC

## 2015-04-20 NOTE — Discharge Instructions (Signed)
Schedule an appointment with a PCP from the resource guide to follow up with.    Back Pain, Adult Back pain is very common in adults.The cause of back pain is rarely dangerous and the pain often gets better over time.The cause of your back pain may not be known. Some common causes of back pain include:  Strain of the muscles or ligaments supporting the spine.  Wear and tear (degeneration) of the spinal disks.  Arthritis.  Direct injury to the back. For many people, back pain may return. Since back pain is rarely dangerous, most people can learn to manage this condition on their own. HOME CARE INSTRUCTIONS Watch your back pain for any changes. The following actions may help to lessen any discomfort you are feeling:  Remain active. It is stressful on your back to sit or stand in one place for long periods of time. Do not sit, drive, or stand in one place for more than 30 minutes at a time. Take short walks on even surfaces as soon as you are able.Try to increase the length of time you walk each day.  Exercise regularly as directed by your health care provider. Exercise helps your back heal faster. It also helps avoid future injury by keeping your muscles strong and flexible.  Do not stay in bed.Resting more than 1-2 days can delay your recovery.  Pay attention to your body when you bend and lift. The most comfortable positions are those that put less stress on your recovering back. Always use proper lifting techniques, including:  Bending your knees.  Keeping the load close to your body.  Avoiding twisting.  Find a comfortable position to sleep. Use a firm mattress and lie on your side with your knees slightly bent. If you lie on your back, put a pillow under your knees.  Avoid feeling anxious or stressed.Stress increases muscle tension and can worsen back pain.It is important to recognize when you are anxious or stressed and learn ways to manage it, such as with  exercise.  Take medicines only as directed by your health care provider. Over-the-counter medicines to reduce pain and inflammation are often the most helpful.Your health care provider may prescribe muscle relaxant drugs.These medicines help dull your pain so you can more quickly return to your normal activities and healthy exercise.  Apply ice to the injured area:  Put ice in a plastic bag.  Place a towel between your skin and the bag.  Leave the ice on for 20 minutes, 2-3 times a day for the first 2-3 days. After that, ice and heat may be alternated to reduce pain and spasms.  Maintain a healthy weight. Excess weight puts extra stress on your back and makes it difficult to maintain good posture. SEEK MEDICAL CARE IF:  You have pain that is not relieved with rest or medicine.  You have increasing pain going down into the legs or buttocks.  You have pain that does not improve in one week.  You have night pain.  You lose weight.  You have a fever or chills. SEEK IMMEDIATE MEDICAL CARE IF:   You develop new bowel or bladder control problems.  You have unusual weakness or numbness in your arms or legs.  You develop nausea or vomiting.  You develop abdominal pain.  You feel faint.   This information is not intended to replace advice given to you by your health care provider. Make sure you discuss any questions you have with your health care provider.  Document Released: 03/28/2005 Document Revised: 04/18/2014 Document Reviewed: 07/30/2013 Elsevier Interactive Patient Education 2016 [place].  Chest Pain Observation  It is often hard to give a specific diagnosis for the cause of chest pain. Among other possibilities your symptoms might be caused by inadequate oxygen delivery to your heart (angina). Angina that is not treated or evaluated can lead to a heart attack (myocardial infarction) or death.  Blood tests, electrocardiograms, and X-rays may have been done to help  determine a possible cause of your chest pain. After evaluation and observation, your health care provider has determined that it is unlikely your pain was caused by an unstable condition that requires hospitalization. However, a full evaluation of your pain may need to be completed, with additional diagnostic testing as directed. It is very important to keep your follow-up appointments. Not keeping your follow-up appointments could result in permanent heart damage, disability, or death. If there is any problem keeping your follow-up appointments, you must call your health care provider.  HOME CARE INSTRUCTIONS  Due to the slight chance that your pain could be angina, it is important to follow your health care provider's treatment plan and also maintain a healthy lifestyle:  Maintain or work toward achieving a healthy weight.  Stay physically active and exercise regularly.  Decrease your salt intake.  Eat a balanced, healthy diet. Talk to a dietitian to learn about heart-healthy foods.  Increase your fiber intake by including whole grains, vegetables, fruits, and nuts in your diet.  Avoid situations that cause stress, anger, or depression.  Take medicines as advised by your health care provider. Report any side effects to your health care provider. Do not stop medicines or adjust the dosages on your own.  Quit smoking. Do not use nicotine patches or gum until you check with your health care provider.  Keep your blood pressure, blood sugar, and cholesterol levels within normal limits.  Limit alcohol intake to no more than 1 drink per day for women who are not pregnant and 2 drinks per day for men.  Do not abuse drugs. SEEK IMMEDIATE MEDICAL CARE IF:  You have severe chest pain or pressure which may include symptoms such as:  You feel pain or pressure in your arms, neck, jaw, or back.  You have severe back or abdominal pain, feel sick to your stomach (nauseous), or throw up (vomit).  You are sweating  profusely.  You are having a fast or irregular heartbeat.  You feel short of breath while at rest.  You notice increasing shortness of breath during rest, sleep, or with activity.  You have chest pain that does not get better after rest or after taking your usual medicine.  You wake from sleep with chest pain.  You are unable to sleep because you cannot breathe.  You develop a frequent cough or you are coughing up blood.  You feel dizzy, faint, or experience extreme fatigue.  You develop severe weakness, dizziness, fainting, or chills. Any of these symptoms may represent a serious problem that is an emergency. Do not wait to see if the symptoms will go away. Call your local emergency services (911 in the U.[place] Do not drive yourself to the hospital.  MAKE SURE YOU:  Understand these instructions.  Will watch your condition.  Will get help right away if you are not doing well or get worse. This information is not intended to replace advice given to you by your health care provider. Make sure you discuss any questions  you have with your health care provider.  Document Released: 04/30/2010 Document Revised: 04/02/2013 Document Reviewed: 09/27/2012  Elsevier Interactive Patient Education [employer].     Emergency Department Resource Guide 1) Find a Doctor and Pay Out of Pocket Although you won't have to find out who is covered by your insurance plan, it is a good idea to ask around and get recommendations. You will then need to call the office and see if the doctor you have chosen will accept you as a new patient and what types of options they offer for patients who are self-pay. Some doctors offer discounts or will set up payment plans for their patients who do not have insurance, but you will need to ask so you aren't surprised when you get to your appointment.  2) Contact Your Local Health Department Not all health departments have doctors that can see patients for sick visits, but  many do, so it is worth a call to see if yours does. If you don't know where your local health department is, you can check in your phone book. The CDC also has a tool to help you locate your state's health department, and many state websites also have listings of all of their local health departments.  3) Find a [facility] If your illness is not likely to be very severe or complicated, you may want to try a walk in clinic. These are popping up all over the country in pharmacies, drugstores, and shopping centers. They're usually staffed by nurse practitioners or physician assistants that have been trained to treat common illnesses and complaints. They're usually fairly quick and inexpensive. However, if you have serious medical issues or chronic medical problems, these are probably not your best option.  No Primary Care Doctor: - Call Health Connect at  [phone] - they can help you locate a primary care doctor that  accepts your insurance, provides certain services, etc. - Physician Referral Service- [phone]  Chronic Pain Problems: Organization         Address  Phone   Notes  [facility]  [phone] Patients need to be referred by their primary care doctor.   Medication Assistance: Organization         Address  Phone   Notes  [facility] Medication [facility] [address] [place], [place] [address] [phone] --Must be a resident of [facility]) -- Must have NO insurance coverage whatsoever (no Medicaid/ Medicare, etc.) -- The pt. MUST have a primary care doctor that directs their care regularly and follows them in the community   MedAssist  [phone]   [employer]  [phone]    Agencies that provide inexpensive medical care: Organization         Address  Phone   Notes  [place]  [phone]   [facility]    [phone]   [facility] [place], [place] [address] [phone]   [place] [address] [place] [phone]   Planned Parenthood    [phone]   [facility]    [phone]   [place] and [place] [address], [place] Phone:  [phone], Fax:  [phone] Hours of Operation:  9 am - 6 pm, M-F.  Also accepts Medicaid/Medicare and self-pay.  [facility] for [place] [address],  [address], [place] Phone: [phone], Fax: [phone]. Hours of Operation:  8:30 am - 5:30 pm, M-F.  Also accepts Medicaid and self-pay.  [facility] [address], [place] Phone: [phone]   [place], [place], [place] [phone], Ext. [phone] Mondays & Thursdays: 7-9 AM.  First 15 patients are seen on a first come, first serve basis.    [place] Providers:  Organization         Address  Phone   Notes  [facility] [address], [place] [phone] Also accepts self-pay patients.  [facility] [id number] [place], [place]  [phone]   [place], [address], [place] [phone]   [facility] Medicine [address] [place] [phone]   Lucianne Lei [address], [place]   [phone] Only accepts [place] Access [place] patients after they have their name applied to their card.   Self-Pay (no insurance) in [facility]:  Organization         Address  Phone   Notes  Sickle Cell Patients, [facility] [place] [phone]   [facility] [place] [phone]   [facility] [place]  [place], [place], [place] [phone]   [facility]/Dr. Osei-Bonsu  [address] [place] or  [address], [place] [phone] Phone number for both [place] and [place] locations is the same.  Urgent Medical and [facility] [address], [place] [phone]   [facility] [address] [place] or [address] [phone]   [facility] [address] [place] [phone], phone; [phone], fax Sees patients 1st and 3rd Saturday of every month.  Must not qualify for public or private insurance (i.e. Medicaid, Medicare, [place] Health Choice, Veterans' Benefits)  Household income should be no more than 200% of the poverty level The clinic cannot treat you if you are pregnant or think you are pregnant  Sexually transmitted diseases are not treated at the clinic.    Dental Care: Organization         Address  Phone  Notes  [facility] Department of [facility] [place] [phone] Accepts children up to age 50 who are enrolled in [place] or [place]; pregnant women with a Medicaid card; and children who have applied for Medicaid or [place] Health Choice, but were declined, whose parents can pay a reduced fee at time of service.  [facility] of [facility]  [address], [place] [phone] Accepts children up to age 32 who are enrolled in [place] or [place]; pregnant women with a Medicaid card; and children who have applied for Medicaid or [place] Health Choice, but were declined, whose parents can pay a reduced fee at time of service.  [facility] [facility] Adult [facility] Access PROGRAM  [place] [phone] Patients are seen by appointment only. Walk-ins are not accepted. [place] will see patients 52 years of age and older. Monday - Tuesday (8am-5pm) Most Wednesdays (8:30-5pm) $30 per visit, cash only  [place]  [address], [facility] [phone] Patients are seen by appointment only. Walk-ins are not accepted. [place] will see patients 26 years of age and older. One Wednesday Evening (Monthly: Volunteer Based).  $30 per visit, cash only  [place]  [phone] for adults; Children under age 62, call Graduate Pediatric Dentistry at [phone]. Children aged 76-14, please call [phone] to request a pediatric application.  Dental services are provided in all areas of dental care including fillings, crowns and bridges, complete and partial dentures, implants, gum treatment, root canals, and extractions. Preventive care is also provided. Treatment is provided to both adults and children. Patients are selected via a lottery and there is often a waiting list.   [facility] [address], [place]  [phone] [email or web address]   [facility] [address] [place], [place] [phone], Ext. [phone] Second and Fourth Thursday of each month, opens at 6:30 AM; Clinic ends at 9 AM.  Patients are seen on a first-come first-served basis, and a limited number are seen during each clinic.   [facility]  [address] Hillard Danker [place], [place] [phone]   Eligibility Requirements You must have lived in [place], [place], or [place] counties for at least the last three months.   You cannot be eligible for state or federal sponsored [employer], including [employer], [place], or [employer].   You generally cannot be eligible for healthcare insurance through your employer.    How to apply: Eligibility screenings are held every Tuesday and Wednesday afternoon from 1:00 pm until 4:00 pm. You do not need an appointment for the interview!  [facility] [address], [place], [place]   [place]  [place] Department  [place]  [phone]    Behavioral Health Resources in the Community: Intensive Outpatient Programs Organization         Address  Phone  Notes  [place]. [address] [place], [place] [phone]   [facility] Outpatient [address] [place], [place]   ADS: Alcohol & Drug Svcs [address] [place], [place]   [place] [address]  [place], [place] or [phone]   Substance Abuse Resources Organization         Address  Phone  Notes  Alcohol and Drug Services  [phone]   [place]  [phone]   [facility] [place]   Chinita Pester  [phone]   Residential & Outpatient Substance Abuse Program  [phone]   Psychological Services Organization         Address  Phone  Notes  [facility]  [place]  [phone]   [place] [address] [place] or [phone]    Mobile Crisis Teams Organization         Address  Phone  Notes  [facility], Mobile Crisis Care Unit  [phone]   Assertive Psychotherapeutic Services  [address] [place], [place]   [place] [address] [place] [phone]    Self-Help/Support Groups Organization         Address  Phone             Notes  [place]. of [address] -  variety of support groups  [phone] Call for more information  Narcotics Anonymous (NA), [facility] [address], [employer] [place]  2 meetings at this location   Residential Facilities manager         Address  Phone  Notes  [facility] Residential Treatment [place],    [place]  [phone]   [facility]  [address] [place] [id number], [place], [place]   [place], [place] [phone]  Admissions: 8am-3pm M-F  Incentives Substance [place] [address]    [place], [place] [phone]   [facility] [address] [place], [place], [place]   [facility] [address]  [place], [place]   [facility] - Intensive Outpatient [address], [place], [place]   [facility] [place].,  [place], [place] [phone] or [phone]   Residential Treatment Services (RTS) [address] [place], [place] Accepts Medicaid  [facility] [place] [address],  [place] [phone] Substance Abuse/Addiction Treatment   [facility] Organization         Address  Phone  Notes  [facility]  [phone]   Domenic Schwab, PhD [address] [place], [place]   [phone] or [phone]   [place], [place] [phone]   [facility] [address], [place], [place] [phone] Insurance/Medicaid/sponsorship through [facility] and Families [address], [address] [place]                                    [place], [place] [phone] [place] [address], [place] [phone]    Dr. Adele Schilder  [phone]   [facility] Dept. 1) [address] [place] 2) [place] 3)  [address], [place] [phone]  [phone]   [place] [phone] or [phone] (After Hours)

## 2015-04-20 NOTE — ED Provider Notes (Signed)
CSN: [id number]     Arrival date & time 04/20/15  1039 History   First MD Initiated Contact with Patient 04/20/15 1257     Chief Complaint  Patient presents with  . Arm Pain  . Chest Pain   HPI  Ms. Ketron is a 45 year old female presenting with back pain and chest pain. She reports onset of left sided back pain last evening. She describes the back pain as aching. It is exacerbated by movement and touch. She has not taken OTC medications. She denies trauma or aggravating event. The back pain radiates to her axilla. She states that she occasionally gets shooting pains into her left chest. The pain does not radiate through from the back. Denies associated diaphoresis, dizziness, syncope, shortness of breath or nausea with the chest pains. 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 She reports being extremely anxious about the chest pain. She states "when I get any weird pain I'm worried it's a heart attack and then that just makes all my symptoms worse". Denies personal or family history of cardiac disease.   Past Medical History  Diagnosis Date  . Hyperemesis gravidarum   . AMA (advanced maternal age) multigravida 35+    Past Surgical History  Procedure Laterality Date  . Dilation and curettage of uterus    . Wisdom tooth extraction    . Tubal ligation Bilateral 08/18/2013    Procedure: POST PARTUM TUBAL LIGATION;  Surgeon: Florian Buff, MD;  Location: [facility];  Service: Gynecology;  Laterality: Bilateral;   History reviewed. No pertinent family history. Social History  Substance Use Topics  . Smoking status: Former Research scientist (life sciences)  . Smokeless tobacco: Never Used  . Alcohol Use: No   OB History    Gravida Para Term Preterm AB TAB SAB Ectopic Multiple Living   8 4 4  4 2 2   4      Review of Systems  Constitutional: Negative for fever and chills.  HENT: Negative.   Eyes: Negative for visual disturbance.  Respiratory: Negative for shortness of breath.   Cardiovascular:  Positive for chest pain. Negative for palpitations.  Gastrointestinal: Negative for nausea.  Musculoskeletal: Positive for back pain.  Neurological: Negative for dizziness, syncope, weakness and headaches.  All other systems reviewed and are negative.     Allergies  Review of patient's allergies indicates no known allergies.  Home Medications   Prior to Admission medications   Medication Sig Start Date End Date Taking? Authorizing Provider  clonazePAM (KLONOPIN) 0.5 MG tablet Take 0.5 mg by mouth daily as needed for anxiety.   Yes Historical Provider, MD  escitalopram (LEXAPRO) 10 MG tablet Take 10 mg by mouth daily.   Yes Historical Provider, MD  ibuprofen (ADVIL,MOTRIN) 800 MG tablet Take 800 mg by mouth every 8 (eight) hours as needed.   Yes Historical Provider, MD  omeprazole (PRILOSEC) 10 MG capsule Take 10 mg by mouth daily.   Yes Historical Provider, MD  methocarbamol (ROBAXIN) 500 MG tablet Take 1 tablet (500 mg total) by mouth 2 (two) times daily. 04/20/15   Mae Denunzio, PA-C   BP 132/79 mmHg  Pulse 60  Temp(Src) 98 F (36.7 C) (Oral)  Resp 19  SpO2 99%  LMP 04/16/2015 Physical Exam  Constitutional: She appears well-developed and well-nourished. No distress.  HENT:  Head: Normocephalic and atraumatic.  Eyes: Conjunctivae and EOM are normal. Right eye exhibits no discharge. Left eye exhibits no discharge. No scleral icterus.  Neck: Normal range of motion. Neck supple.  Cardiovascular: Normal rate, regular rhythm, normal heart sounds and intact distal pulses.   Pulmonary/Chest: Effort normal and breath sounds normal. No respiratory distress. She has no wheezes. She has no rales.  Abdominal: Soft. There is no tenderness.  Musculoskeletal: Normal range of motion.       Thoracic back: She exhibits tenderness. She exhibits normal range of motion, no deformity and no spasm.       Back:  Generalized TTP over left thoracic region as indicated in diagram. No spasm felt. FROM  of BUE, c spine, l spine and t spine. No localized tenderness over bony spine. No deformities.   Neurological: She is alert. No cranial nerve deficit. Coordination normal.  5/5 strength of all major muscle groups. Sensation to light touch intact throughout.   Skin: Skin is warm and dry.  No skin changes over back.  Psychiatric: She has a normal mood and affect. Her behavior is normal.  Nursing note and vitals reviewed.   ED Course  Procedures (including critical care time) Labs Review Labs Reviewed  BASIC METABOLIC PANEL - Abnormal; Notable for the following:    Glucose, Bld 150 (*)    Calcium 8.6 (*)    All other components within normal limits  CBC - Abnormal; Notable for the following:    Hemoglobin 9.7 (*)    HCT 30.2 (*)    MCV 69.3 (*)    MCH 22.2 (*)    RDW 17.0 (*)    All other components within normal limits  I-STAT TROPOININ, ED  Randolm Idol, ED    Imaging Review Dg Chest 2 View  04/20/2015  CLINICAL DATA:  45 year old female with hyperemesis gravidarum. Chest pain. Initial encounter. EXAM: CHEST  2 VIEW COMPARISON:  02/28/2014 FINDINGS: Normal lung volumes. Normal cardiac size and mediastinal contours. Visualized tracheal air column is within normal limits. The lungs are clear. No pneumothorax or pleural effusion. No pneumoperitoneum. Negative visible bowel gas pattern. Minimal scoliotic curvature in the spine, otherwise no osseous abnormality identified. IMPRESSION: Negative, no acute cardiopulmonary abnormality. Electronically Signed   By: Genevie Ann M.D.   On: 04/20/2015 11:32   I have personally reviewed and evaluated these images and lab results as part of my medical decision-making.   EKG Interpretation   Date/Time:  Monday April 20 2015 10:43:11 EST Ventricular Rate:  73 PR Interval:  140 QRS Duration: 84 QT Interval:  390 QTC Calculation: 429 R Axis:   27 Text Interpretation:  Normal sinus rhythm with sinus arrhythmia Normal ECG  Confirmed by RAY MD,  Andee Poles [id number]) on 04/20/2015 2:07:12 PM      MDM   Final diagnoses:  Left-sided thoracic back pain   Patient presenting with back pain x 1 day. Pain is aching in the left thoracic region. Pain occasionally radiates into axilla and left anterior chest. No associated symptoms with the chest pain. VSS. Non-focal neuro exam. Generalized musculoskeletal tenderness of left thoracic region. Back pain improved with robaxin in ED. Heart RRR. Lungs CTAB. Delta troponin 0.00 with non-iscehmic ECG. CXR without abnormality. PERC negative. Heart score 0. Presentation and workup is not consistent with cardiac or pulmonary pain origin. Conservative therapy for the back pain including back exercises, heat, ice, tylenol or ibuprofen discussed. Will give muscle relaxer for back spasms. Discussed side effects of muscle relaxer and avoiding driving. Pt also noted to be anemia to 9.7. She endorses this is her baseline and she is supposed to take iron  pills but she does not like them. Pt is to follow up with PCP. Pt has been advised to return to the ED if CP becomes exertional, associated with diaphoresis or nausea, radiates to left jaw/arm, worsens or becomes concerning in any way. Pt appears reliable for follow up and is agreeable to discharge. Return precautions discussed and given in discharge paperwork. Pt is stable for discharge.     Lahoma Crocker Jorrell Kuster, PA-C 04/20/15 1858  Pattricia Boss, MD 04/26/15 0003

## 2015-04-20 NOTE — ED Notes (Signed)
Reports onset yesterday of pain under left arm that radiates into her back. Also having intermittent left side sharp chest pains.

## 2015-07-22 IMAGING — US US OB LIMITED
1 series · 13 of 21 positions shown · non-contrast
Comparison: none

[Series 1: us fetal bpp w/o nonstress · non-contrast · 21 acquisitions, 13 frames shown]
[im 1/21]
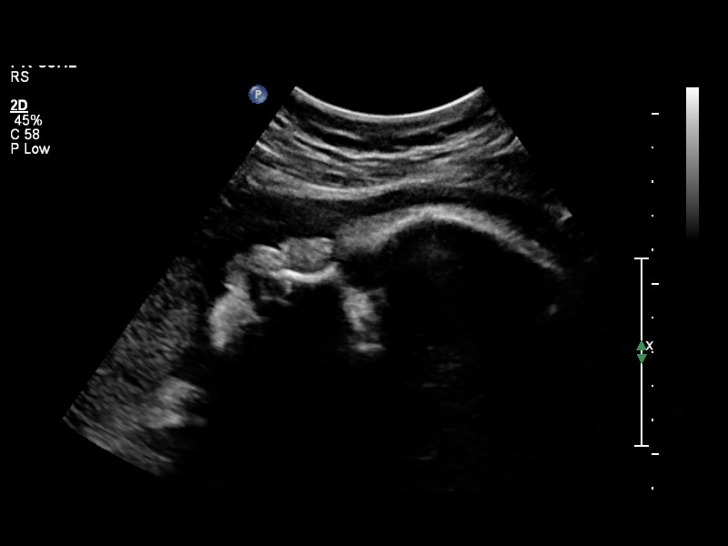
[im 3/21]
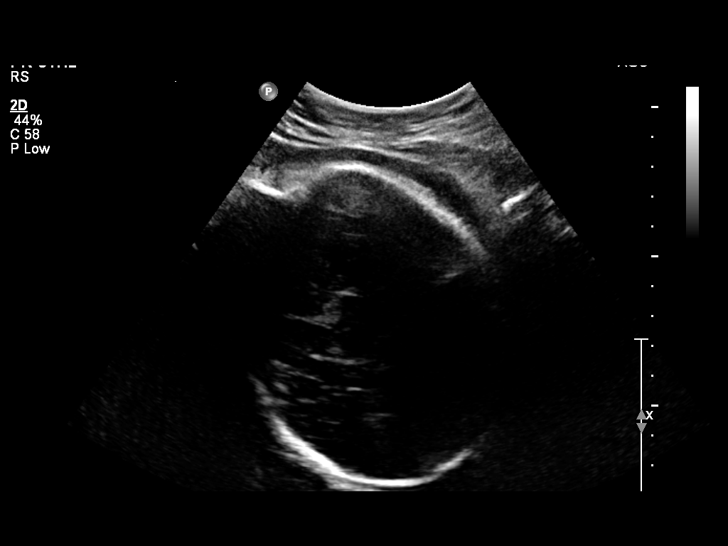
[im 5/21]
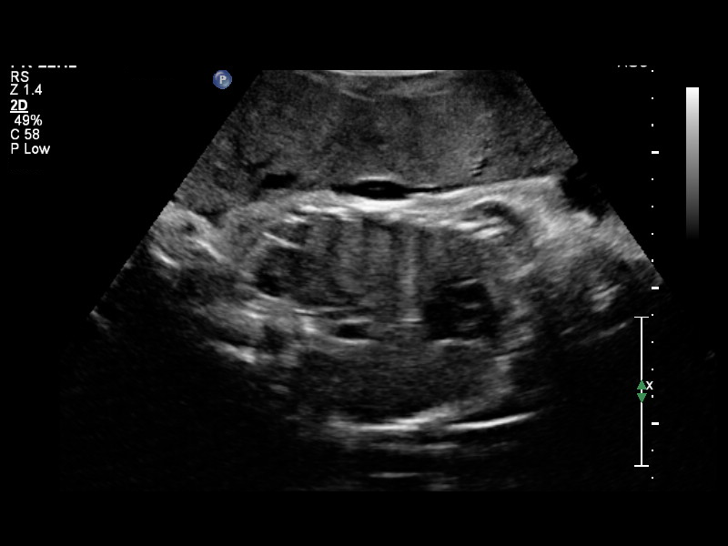
[im 6/21]
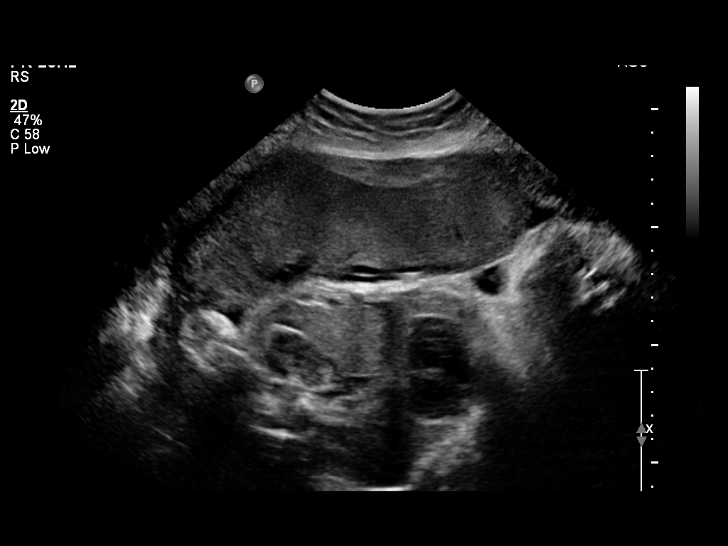
[im 8/21]
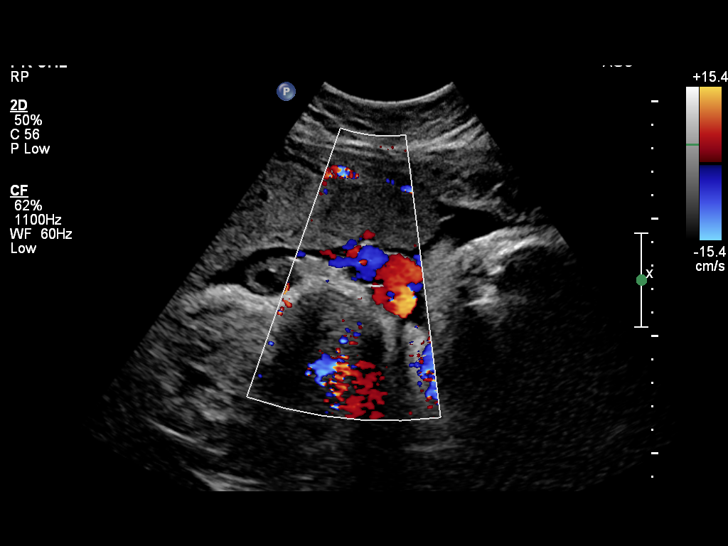
[im 9/21]
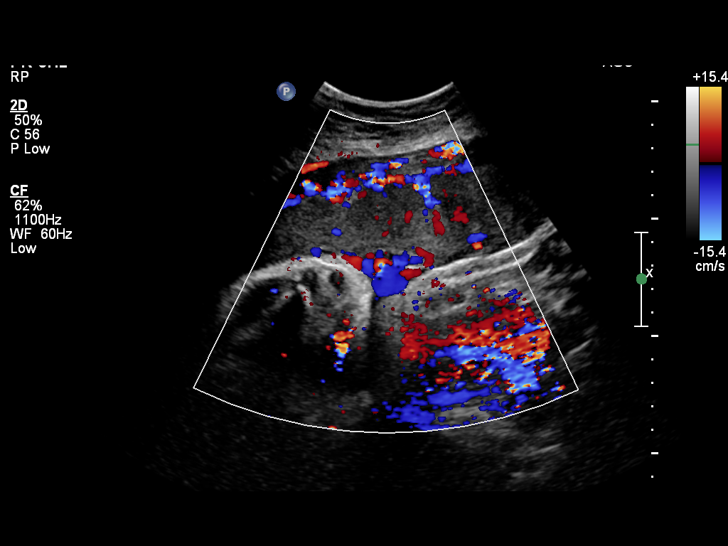
[im 11/21]
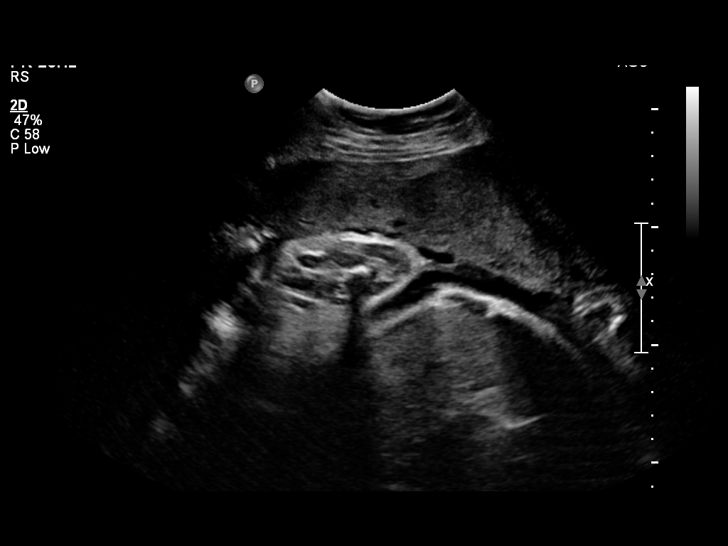
[im 13/21]
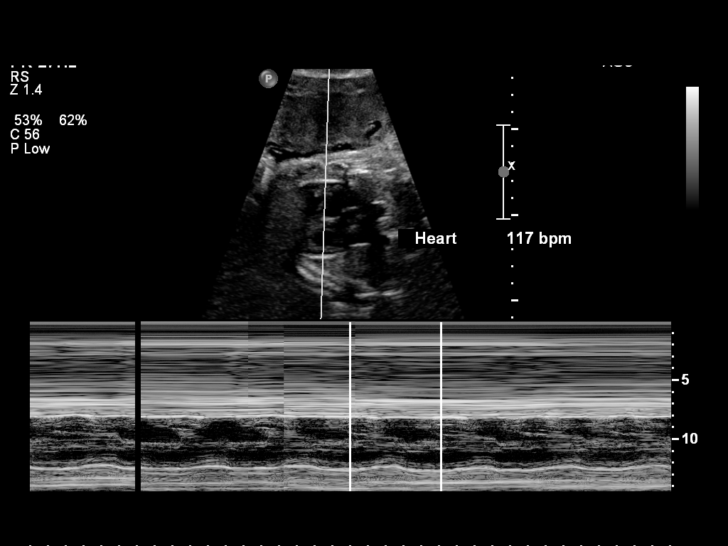
[im 14/21]
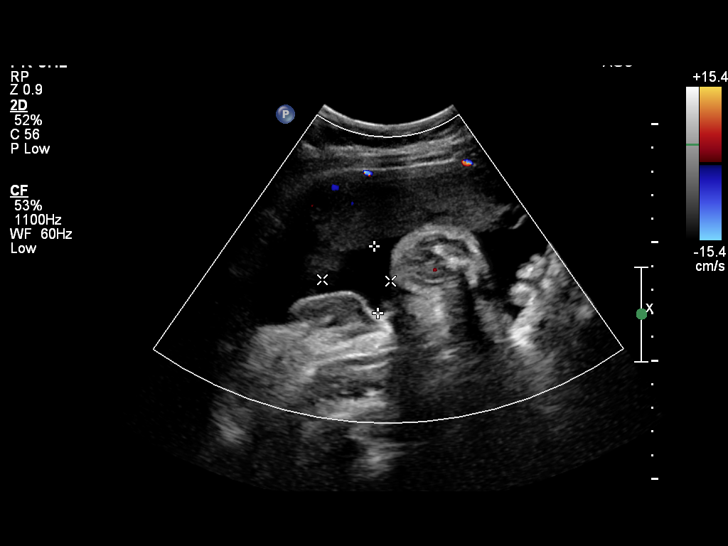
[im 16/21]
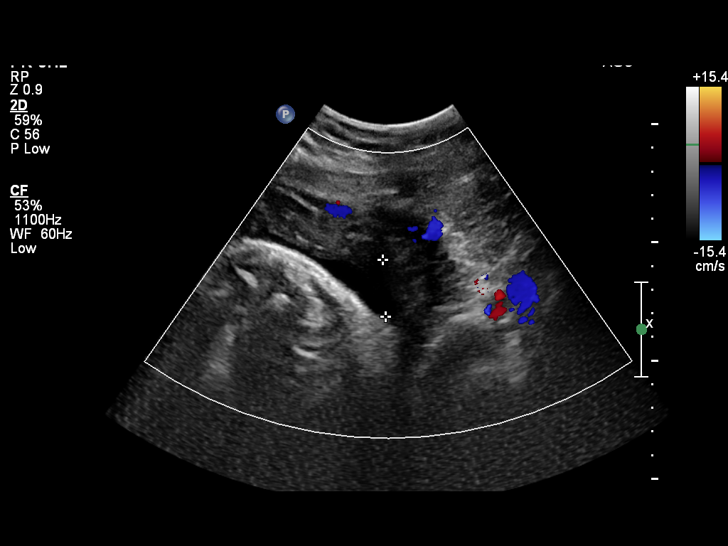
[im 17/21]
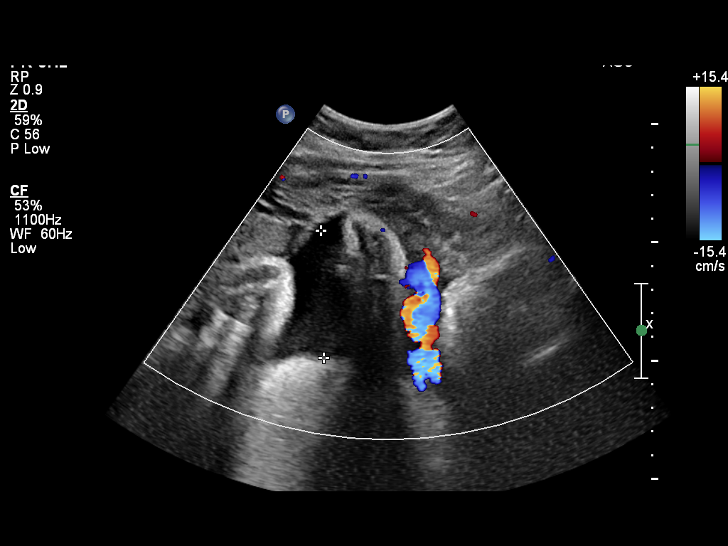
[im 19/21]
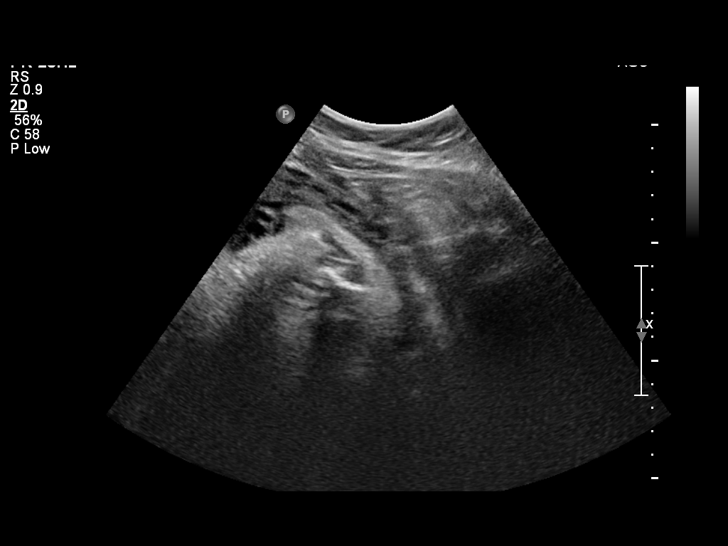
[im 21/21]
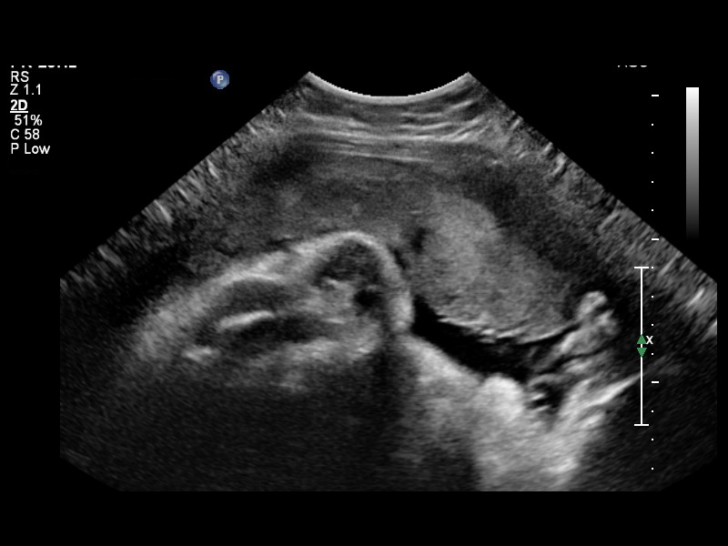

[13 of 21 positions shown; findings below may reference images not displayed]

OBSTETRICS REPORT
                      (Signed Final 07/30/2013 [DATE])

Service(s) Provided

 [HOSPITAL]                                         76815.0
Indications

 Advanced maternal age (AMA), Multigravida (42yo)
 Abnormal biochemical screen (quad) for Trisomy
 21 (DSR [DATE]); ESGUERRA/NECTALI HARDING; no further testing
Fetal Evaluation

 Num Of Fetuses:    1
 Fetal Heart Rate:  117                          bpm
 Cardiac Activity:  Observed
 Presentation:      Cephalic
 Placenta:          Anterior, above cervical os
 P. Cord            Visualized, central
 Insertion:

 Amniotic Fluid
 AFI FV:      Subjectively within normal limits
 AFI Sum:     10.59   cm       28  %Tile     Larg Pckt:    5.37  cm
 RUQ:   2.82    cm   RLQ:    5.37   cm    LLQ:   2.4     cm
Biophysical Evaluation

 Amniotic F.V:   Pocket => 2 cm two         F. Tone:        Observed
                 planes
 F. Movement:    Observed                   Score:          [DATE]
 F. Breathing:   Observed
Gestational Age

 LMP:           36w 5d        Date:  11/15/12                 EDD:   08/22/13
 Best:          36w 5d     Det. By:  LMP  (11/15/12)          EDD:   08/22/13
Cervix Uterus Adnexa

 Cervix:       Not visualized (advanced GA >69wks)
 Uterus:       No abnormality visualized.
 Cul De Sac:   No free fluid seen.
 Left Ovary:    Not visualized.
 Right Ovary:   Not visualized.
 Adnexa:     No abnormality visualized.
Impression

 SIUP at 36+5 weeks
 Normal amniotic fluid volume
 BPP [DATE]
Recommendations

 Continue twice weekly NSTs with weekly AFIs or weekly BPPs

## 2015-08-03 ENCOUNTER — Ambulatory Visit (HOSPITAL_COMMUNITY)
Admission: EM | Admit: 2015-08-03 | Discharge: 2015-08-03 | Disposition: A | Discharge status: Home / Self Care | Payer: PRIVATE HEALTH INSURANCE | Attending: Family Medicine | Admitting: Family Medicine

## 2015-08-03 ENCOUNTER — Encounter (HOSPITAL_COMMUNITY): Payer: Self-pay | Admitting: *Deleted

## 2015-08-03 DIAGNOSIS — K219 Gastro-esophageal reflux disease without esophagitis: Secondary | ICD-10-CM | POA: Diagnosis not present

## 2015-08-03 DIAGNOSIS — M546 Pain in thoracic spine: Secondary | ICD-10-CM | POA: Insufficient documentation

## 2015-08-03 DIAGNOSIS — Z79899 Other long term (current) drug therapy: Secondary | ICD-10-CM | POA: Diagnosis not present

## 2015-08-03 DIAGNOSIS — F329 Major depressive disorder, single episode, unspecified: Secondary | ICD-10-CM | POA: Insufficient documentation

## 2015-08-03 DIAGNOSIS — F17200 Nicotine dependence, unspecified, uncomplicated: Secondary | ICD-10-CM | POA: Insufficient documentation

## 2015-08-03 DIAGNOSIS — J029 Acute pharyngitis, unspecified: Secondary | ICD-10-CM | POA: Diagnosis not present

## 2015-08-03 HISTORY — DX: Depression, unspecified: F32.A

## 2015-08-03 HISTORY — DX: Gastro-esophageal reflux disease without esophagitis: K21.9

## 2015-08-03 HISTORY — DX: Major depressive disorder, single episode, unspecified: F32.9

## 2015-08-03 LAB — POCT RAPID STREP A: Streptococcus, Group A Screen (Direct): NEGATIVE

## 2015-08-03 MED ORDER — AMOXICILLIN 500 MG PO CAPS: 500.0000 mg | ORAL_CAPSULE | Freq: Three times a day (TID) | ORAL | Status: DC

## 2015-08-03 MED ORDER — OXYCODONE-ACETAMINOPHEN 7.5-325 MG PO TABS: 1.0000 | ORAL_TABLET | ORAL | Status: DC | PRN

## 2015-08-03 NOTE — ED Provider Notes (Signed)
CSN: [id number]     Arrival date & time 08/03/15  1739 History   First MD Initiated Contact with Patient 08/03/15 1913     Chief Complaint  Patient presents with  . Back Pain  . Neck Pain  . Sore Throat   (Consider location/radiation/quality/duration/timing/severity/associated sxs/prior Treatment) HPI History obtained from patient: Location: Back and throat  Context/Duration: Back pain off and on for about 1 week. Throat pain, since Saturday, throat examined at her job, and she was told thyroid is the problem.   Severity:4-5   Quality: similar to previous back pain Timing:   constant         Home Treatment: OTC meds without relief, robaxin not helping Associated symptoms:  Hurts with movement, and throat hurts to swallow.  Past Medical History  Diagnosis Date  . Hyperemesis gravidarum   . AMA (advanced maternal age) multigravida 26+   . GERD (gastroesophageal reflux disease)   . Depression    Past Surgical History  Procedure Laterality Date  . Dilation and curettage of uterus    . Wisdom tooth extraction    . Tubal ligation Bilateral 08/18/2013    Procedure: POST PARTUM TUBAL LIGATION;  Surgeon: Florian Buff, MD;  Location: [facility];  Service: Gynecology;  Laterality: Bilateral;   No family history on file. Social History  Substance Use Topics  . Smoking status: Current Every Day Smoker  . Smokeless tobacco: Never Used  . Alcohol Use: Yes   OB History    Gravida Para Term Preterm AB TAB SAB Ectopic Multiple Living   8 4 4  4 2 2   4      Review of Systems ROS +'vesore throat, back pain  Denies: HEADACHE, NAUSEA, ABDOMINAL PAIN, CHEST PAIN, CONGESTION, DYSURIA, SHORTNESS OF BREATH  Allergies  Review of patient's allergies indicates no known allergies.  Home Medications   Prior to Admission medications   Medication Sig Start Date End Date Taking? Authorizing Provider  escitalopram (LEXAPRO) 10 MG tablet Take 10 mg by mouth daily.   Yes Historical Provider, MD   omeprazole (PRILOSEC) 10 MG capsule Take 10 mg by mouth daily.   Yes Historical Provider, MD  amoxicillin (AMOXIL) 500 MG capsule Take 1 capsule (500 mg total) by mouth 3 (three) times daily. 08/03/15   Konrad Felix, PA  clonazePAM (KLONOPIN) 0.5 MG tablet Take 0.5 mg by mouth daily as needed for anxiety.    Historical Provider, MD  ibuprofen (ADVIL,MOTRIN) 800 MG tablet Take 800 mg by mouth every 8 (eight) hours as needed.    Historical Provider, MD  methocarbamol (ROBAXIN) 500 MG tablet Take 1 tablet (500 mg total) by mouth 2 (two) times daily. 04/20/15   Stevi Barrett, PA-C  oxyCODONE-acetaminophen (PERCOCET) 7.5-325 MG tablet Take 1 tablet by mouth every 4 (four) hours as needed for severe pain. 08/03/15   Konrad Felix, PA   Meds Ordered and Administered this Visit  Medications - No data to display  BP 130/82 mmHg  Pulse 70  Temp(Src) 98.4 F (36.9 C) (Oral)  Resp 16  SpO2 100%  LMP 07/20/2015 (Exact Date) No data found.   Physical Exam NURSES NOTES AND VITAL SIGNS REVIEWED. CONSTITUTIONAL: Well developed, well nourished, no acute distress HEENT: normocephalic, atraumatic, nodes not palpable, thyroid not palpable.. Throat is clear without exudate EYES: Conjunctiva normal NECK:normal ROM, supple, no adenopathy PULMONARY:No respiratory distress, normal effort MUSCULOSKELETAL: Normal ROM of all extremities, muscular tenderness left mid thoracic region.  SKIN: warm and dry without  rash PSYCHIATRIC: Mood and affect, behavior are normal  ED Course  Procedures (including critical care time)  Labs Review Labs Reviewed  CULTURE, GROUP A STREP [facility])  POCT RAPID STREP A    Imaging Review No results found.   Visual Acuity Review  Right Eye Distance:   Left Eye Distance:   Bilateral Distance:    Right Eye Near:   Left Eye Near:    Bilateral Near:      PRESCRIPTION: amoxil, percocet  SUGGEST CONTINUED SYMPTOMATIC TREATMENT AT HOME.    MDM   1. Left-sided  thoracic back pain   2. Pharyngitis     Patient is reassured that there are no issues that require transfer to higher level of care at this time or additional tests. Patient is advised to continue home symptomatic treatment. Patient is advised that if there are new or worsening symptoms to attend the emergency department, contact primary care provider, or return to [facility]. Instructions of care provided discharged home in stable condition.    THIS NOTE WAS GENERATED USING A VOICE RECOGNITION SOFTWARE PROGRAM. ALL REASONABLE EFFORTS  WERE MADE TO PROOFREAD THIS DOCUMENT FOR ACCURACY.  I have verbally reviewed the discharge instructions with the patient. A printed AVS was given to the patient.  All questions were answered prior to discharge.      Konrad Felix, PA 08/03/15 2101

## 2015-08-03 NOTE — Discharge Instructions (Signed)
Back Pain, Adult °Back pain is very common in adults. The cause of back pain is rarely dangerous and the pain often gets better over time. The cause of your back pain may not be known. Some common causes of back pain include: °· Strain of the muscles or ligaments supporting the spine. °· Wear and tear (degeneration) of the spinal disks. °· Arthritis. °· Direct injury to the back. °For many people, back pain may return. Since back pain is rarely dangerous, most people can learn to manage this condition on their own. °HOME CARE INSTRUCTIONS °Watch your back pain for any changes. The following actions may help to lessen any discomfort you are feeling: °· Remain active. It is stressful on your back to sit or stand in one place for long periods of time. Do not sit, drive, or stand in one place for more than 30 minutes at a time. Take short walks on even surfaces as soon as you are able. Try to increase the length of time you walk each day. °· Exercise regularly as directed by your health care provider. Exercise helps your back heal faster. It also helps avoid future injury by keeping your muscles strong and flexible. °· Do not stay in bed. Resting more than 1-2 days can delay your recovery. °· Pay attention to your body when you bend and lift. The most comfortable positions are those that put less stress on your recovering back. Always use proper lifting techniques, including: °· Bending your knees. °· Keeping the load close to your body. °· Avoiding twisting. °· Find a comfortable position to sleep. Use a firm mattress and lie on your side with your knees slightly bent. If you lie on your back, put a pillow under your knees. °· Avoid feeling anxious or stressed. Stress increases muscle tension and can worsen back pain. It is important to recognize when you are anxious or stressed and learn ways to manage it, such as with exercise. °· Take medicines only as directed by your health care provider. Over-the-counter  medicines to reduce pain and inflammation are often the most helpful. Your health care provider may prescribe muscle relaxant drugs. These medicines help dull your pain so you can more quickly return to your normal activities and healthy exercise. °· Apply ice to the injured area: °· Put ice in a plastic bag. °· Place a towel between your skin and the bag. °· Leave the ice on for 20 minutes, 2-3 times a day for the first 2-3 days. After that, ice and heat may be alternated to reduce pain and spasms. °· Maintain a healthy weight. Excess weight puts extra stress on your back and makes it difficult to maintain good posture. °SEEK MEDICAL CARE IF: °· You have pain that is not relieved with rest or medicine. °· You have increasing pain going down into the legs or buttocks. °· You have pain that does not improve in one week. °· You have night pain. °· You lose weight. °· You have a fever or chills. °SEEK IMMEDIATE MEDICAL CARE IF:  °· You develop new bowel or bladder control problems. °· You have unusual weakness or numbness in your arms or legs. °· You develop nausea or vomiting. °· You develop abdominal pain. °· You feel faint. °  °This information is not intended to replace advice given to you by your health care provider. Make sure you discuss any questions you have with your health care provider. °  °Document Released: 03/28/2005 Document Revised: 04/18/2014 Document Reviewed: 07/30/2013 °Elsevier Interactive Patient Education ©2016 Elsevier   Inc.  Chronic Back Pain  When back pain lasts longer than 3 months, it is called chronic back pain.People with chronic back pain often go through certain periods that are more intense (flare-ups).  CAUSES Chronic back pain can be caused by wear and tear (degeneration) on different structures in your back. These structures include:  The bones of your spine (vertebrae) and the joints surrounding your spinal cord and nerve roots (facets).  The strong, fibrous tissues that  connect your vertebrae (ligaments). Degeneration of these structures may result in pressure on your nerves. This can lead to constant pain. HOME CARE INSTRUCTIONS  Avoid bending, heavy lifting, prolonged sitting, and activities which make the problem worse.  Take brief periods of rest throughout the day to reduce your pain. Lying down or standing usually is better than sitting while you are resting.  Take over-the-counter or prescription medicines only as directed by your caregiver. SEEK IMMEDIATE MEDICAL CARE IF:   You have weakness or numbness in one of your legs or feet.  You have trouble controlling your bladder or bowels.  You have nausea, vomiting, abdominal pain, shortness of breath, or fainting.   This information is not intended to replace advice given to you by your health care provider. Make sure you discuss any questions you have with your health care provider.   Document Released: 05/05/2004 Document Revised: 06/20/2011 Document Reviewed: 09/15/2014 Elsevier Interactive Patient Education 2016 Elsevier Inc. Pharyngitis Pharyngitis is a sore throat (pharynx). There is redness, pain, and swelling of your throat. HOME CARE   Drink enough fluids to keep your pee (urine) clear or pale yellow.  Only take medicine as told by your doctor.  You may get sick again if you do not take medicine as told. Finish your medicines, even if you start to feel better.  Do not take aspirin.  Rest.  Rinse your mouth (gargle) with salt water ( tsp of salt per 1 qt of water) every 1-2 hours. This will help the pain.  If you are not at risk for choking, you can suck on hard candy or sore throat lozenges. GET HELP IF:  You have large, tender lumps on your neck.  You have a rash.  You cough up green, yellow-brown, or bloody spit. GET HELP RIGHT AWAY IF:   You have a stiff neck.  You drool or cannot swallow liquids.  You throw up (vomit) or are not able to keep medicine or liquids  down.  You have very bad pain that does not go away with medicine.  You have problems breathing (not from a stuffy nose). MAKE SURE YOU:   Understand these instructions.  Will watch your condition.  Will get help right away if you are not doing well or get worse.   This information is not intended to replace advice given to you by your health care provider. Make sure you discuss any questions you have with your health care provider.   Document Released: 09/14/2007 Document Revised: 01/16/2013 Document Reviewed: 12/03/2012 Elsevier Interactive Patient Education [employer].

## 2015-08-03 NOTE — ED Notes (Signed)
C/O ongoing back pain x 1 month; has taken Robaxin on occasion.  Denies injury.  C/O "knots" and spasms".  Over past 3 days has had external anterior and lateral neck pain and tenderness, and having some slight throat pain when swallowing.

## 2015-08-06 LAB — CULTURE, GROUP A STREP (THRC)

## 2015-08-12 ENCOUNTER — Other Ambulatory Visit: Payer: Self-pay | Admitting: Family Medicine

## 2015-08-12 DIAGNOSIS — E049 Nontoxic goiter, unspecified: Secondary | ICD-10-CM

## 2015-08-12 DIAGNOSIS — N939 Abnormal uterine and vaginal bleeding, unspecified: Secondary | ICD-10-CM

## 2015-08-17 ENCOUNTER — Ambulatory Visit
Admission: RE | Admit: 2015-08-17 | Discharge: 2015-08-17 | Disposition: A | Discharge status: Home / Self Care | Payer: PRIVATE HEALTH INSURANCE | Source: Ambulatory Visit | Attending: Family Medicine | Admitting: Family Medicine

## 2015-08-17 DIAGNOSIS — N939 Abnormal uterine and vaginal bleeding, unspecified: Secondary | ICD-10-CM

## 2015-08-17 DIAGNOSIS — N926 Irregular menstruation, unspecified: Secondary | ICD-10-CM

## 2015-08-17 DIAGNOSIS — E049 Nontoxic goiter, unspecified: Secondary | ICD-10-CM

## 2015-12-23 ENCOUNTER — Other Ambulatory Visit: Payer: Self-pay | Admitting: Family Medicine

## 2015-12-23 DIAGNOSIS — Z1231 Encounter for screening mammogram for malignant neoplasm of breast: Secondary | ICD-10-CM

## 2015-12-30 ENCOUNTER — Ambulatory Visit
Admission: RE | Admit: 2015-12-30 | Discharge: 2015-12-30 | Disposition: A | Discharge status: Home / Self Care | Payer: PRIVATE HEALTH INSURANCE | Source: Ambulatory Visit | Attending: Family Medicine | Admitting: Family Medicine

## 2015-12-30 DIAGNOSIS — Z1231 Encounter for screening mammogram for malignant neoplasm of breast: Secondary | ICD-10-CM

## 2016-01-20 ENCOUNTER — Other Ambulatory Visit: Payer: Self-pay | Admitting: Family Medicine

## 2016-01-20 DIAGNOSIS — R928 Other abnormal and inconclusive findings on diagnostic imaging of breast: Secondary | ICD-10-CM

## 2016-01-27 ENCOUNTER — Encounter (HOSPITAL_COMMUNITY): Payer: Self-pay | Admitting: Emergency Medicine

## 2016-01-27 ENCOUNTER — Ambulatory Visit
Admission: RE | Admit: 2016-01-27 | Discharge: 2016-01-27 | Disposition: A | Discharge status: Home / Self Care | Payer: PRIVATE HEALTH INSURANCE | Source: Ambulatory Visit | Attending: Family Medicine | Admitting: Family Medicine

## 2016-01-27 ENCOUNTER — Ambulatory Visit (HOSPITAL_COMMUNITY)
Admission: EM | Admit: 2016-01-27 | Discharge: 2016-01-27 | Disposition: A | Discharge status: Home / Self Care | Payer: PRIVATE HEALTH INSURANCE | Attending: Family Medicine | Admitting: Family Medicine

## 2016-01-27 DIAGNOSIS — Z23 Encounter for immunization: Secondary | ICD-10-CM | POA: Diagnosis not present

## 2016-01-27 DIAGNOSIS — S61218A Laceration without foreign body of other finger without damage to nail, initial encounter: Secondary | ICD-10-CM

## 2016-01-27 DIAGNOSIS — R928 Other abnormal and inconclusive findings on diagnostic imaging of breast: Secondary | ICD-10-CM

## 2016-01-27 MED ORDER — TETANUS-DIPHTH-ACELL PERTUSSIS 5-2.5-18.5 LF-MCG/0.5 IM SUSP
0.5000 mL | Freq: Once | INTRAMUSCULAR | Status: AC
  Administered 2016-01-27: 0.5 mL via INTRAMUSCULAR

## 2016-01-27 MED ORDER — TETANUS-DIPHTH-ACELL PERTUSSIS 5-2.5-18.5 LF-MCG/0.5 IM SUSP
INTRAMUSCULAR | Status: AC
  Filled 2016-01-27: qty 0.5

## 2016-01-27 NOTE — ED Triage Notes (Signed)
The patient presented to the [facility] with a complaint of a laceration to her index and middle fingers on her right hand that occurred last night. The patient reported that she cut her fingers on an aluminum can. The patient was unsure of her last TDAP booster.

## 2016-01-27 NOTE — Discharge Instructions (Signed)
Watch for infection Return if new or worsening of symptoms

## 2016-01-27 NOTE — ED Notes (Signed)
Patient's finger placed in a betadine/sterile water soak.

## 2016-01-29 NOTE — ED Provider Notes (Signed)
CSN: [id number]     Arrival date & time 01/27/16  1312 History   First MD Initiated Contact with Patient 01/27/16 1334     Chief Complaint  Patient presents with  . Extremity Laceration   (Consider location/radiation/quality/duration/timing/severity/associated sxs/prior Treatment) HPI 45 y/o female with laceration of index and middle fingers last night. No active bleeding. Cut on can that she was opening. Unsure of tetanus status.  Past Medical History:  Diagnosis Date  . AMA (advanced maternal age) multigravida 53+   . Depression   . GERD (gastroesophageal reflux disease)   . Hyperemesis gravidarum    Past Surgical History:  Procedure Laterality Date  . DILATION AND CURETTAGE OF UTERUS    . TUBAL LIGATION Bilateral 08/18/2013   Procedure: POST PARTUM TUBAL LIGATION;  Surgeon: Florian Buff, MD;  Location: [facility];  Service: Gynecology;  Laterality: Bilateral;  . WISDOM TOOTH EXTRACTION     History reviewed. No pertinent family history. Social History  Substance Use Topics  . Smoking status: Current Every Day Smoker  . Smokeless tobacco: Never Used  . Alcohol use Yes   OB History    Gravida Para Term Preterm AB Living   8 4 4   4 4    SAB TAB Ectopic Multiple Live Births   2 2     4      Review of Systems  Denies: HEADACHE, NAUSEA, ABDOMINAL PAIN, CHEST PAIN, CONGESTION, DYSURIA, SHORTNESS OF BREATH  Allergies  Review of patient's allergies indicates no known allergies.  Home Medications   Prior to Admission medications   Medication Sig Start Date End Date Taking? Authorizing Provider  escitalopram (LEXAPRO) 10 MG tablet Take 10 mg by mouth daily.   Yes Historical Provider, MD  amoxicillin (AMOXIL) 500 MG capsule Take 1 capsule (500 mg total) by mouth 3 (three) times daily. 08/03/15   Konrad Felix, PA  clonazePAM (KLONOPIN) 0.5 MG tablet Take 0.5 mg by mouth daily as needed for anxiety.    Historical Provider, MD  ibuprofen (ADVIL,MOTRIN) 800 MG tablet Take 800 mg by  mouth every 8 (eight) hours as needed.    Historical Provider, MD  methocarbamol (ROBAXIN) 500 MG tablet Take 1 tablet (500 mg total) by mouth 2 (two) times daily. 04/20/15   Stevi Barrett, PA-C  omeprazole (PRILOSEC) 10 MG capsule Take 10 mg by mouth daily.    Historical Provider, MD  oxyCODONE-acetaminophen (PERCOCET) 7.5-325 MG tablet Take 1 tablet by mouth every 4 (four) hours as needed for severe pain. 08/03/15   Konrad Felix, PA   Meds Ordered and Administered this Visit   Medications  Tdap (BOOSTRIX) injection 0.5 mL (0.5 mLs Intramuscular Given 01/27/16 1433)    BP 121/79 (BP Location: Left Arm)   Pulse 77   Temp 98.9 F (37.2 C) (Oral)   Resp 18   LMP 12/25/2015 (Exact Date)   SpO2 99%  No data found.   Physical Exam NURSES NOTES AND VITAL SIGNS REVIEWED. CONSTITUTIONAL: Well developed, well nourished, no acute distress HEENT: normocephalic, atraumatic EYES: Conjunctiva normal NECK:normal ROM, supple, no adenopathy PULMONARY:No respiratory distress, normal effort ABDOMINAL: Soft, ND, NT BS+, No CVAT MUSCULOSKELETAL: Normal ROM of all extremities, superficial wounds to the right index and long finger.  SKIN: warm and dry without rash PSYCHIATRIC: Mood and affect, behavior are normal  Urgent Care Course   Clinical Course    Procedures (including critical care time)  Labs Review Labs Reviewed - No data to display  Imaging Review No  results found.   Visual Acuity Review  Right Eye Distance:   Left Eye Distance:   Bilateral Distance:    Right Eye Near:   Left Eye Near:    Bilateral Near:         MDM   1. Laceration of ring finger without foreign body without damage to nail, unspecified laterality, initial encounter     Patient is reassured that there are no issues that require transfer to higher level of care at this time or additional tests. Patient is advised to continue home symptomatic treatment. Patient is advised that if there are new or  worsening symptoms to attend the emergency department, contact primary care provider, or return to UC. Instructions of care provided discharged home in stable condition.    THIS NOTE WAS GENERATED USING A VOICE RECOGNITION SOFTWARE PROGRAM. ALL REASONABLE EFFORTS  WERE MADE TO PROOFREAD THIS DOCUMENT FOR ACCURACY.  I have verbally reviewed the discharge instructions with the patient. A printed AVS was given to the patient.  All questions were answered prior to discharge.      Konrad Felix, [place] 01/29/16 [id number]

## 2016-02-08 ENCOUNTER — Encounter: Payer: PRIVATE HEALTH INSURANCE | Admitting: Physical Therapy

## 2016-02-10 ENCOUNTER — Encounter: Payer: PRIVATE HEALTH INSURANCE | Admitting: Physical Therapy

## 2016-02-17 ENCOUNTER — Encounter: Payer: PRIVATE HEALTH INSURANCE | Admitting: Physical Therapy

## 2016-02-22 ENCOUNTER — Encounter: Payer: PRIVATE HEALTH INSURANCE | Admitting: Physical Therapy

## 2016-02-24 ENCOUNTER — Encounter: Payer: PRIVATE HEALTH INSURANCE | Admitting: Physical Therapy

## 2016-03-25 ENCOUNTER — Encounter (INDEPENDENT_AMBULATORY_CARE_PROVIDER_SITE_OTHER): Payer: Self-pay | Admitting: Sports Medicine

## 2016-03-25 ENCOUNTER — Ambulatory Visit (INDEPENDENT_AMBULATORY_CARE_PROVIDER_SITE_OTHER): Payer: PRIVATE HEALTH INSURANCE | Admitting: Sports Medicine

## 2016-03-25 DIAGNOSIS — M25561 Pain in right knee: Secondary | ICD-10-CM | POA: Diagnosis not present

## 2016-03-25 DIAGNOSIS — M25562 Pain in left knee: Secondary | ICD-10-CM | POA: Diagnosis not present

## 2016-03-25 DIAGNOSIS — G8929 Other chronic pain: Secondary | ICD-10-CM | POA: Diagnosis not present

## 2016-03-25 MED ORDER — LIDOCAINE HCL 1 % IJ SOLN
3.0000 mL | INTRAMUSCULAR | Status: AC | PRN
  Administered 2016-03-25: 3 mL

## 2016-03-25 MED ORDER — METHYLPREDNISOLONE ACETATE 40 MG/ML IJ SUSP
40.0000 mg | INTRAMUSCULAR | Status: AC | PRN
  Administered 2016-03-25: 40 mg via INTRA_ARTICULAR

## 2016-03-25 NOTE — Progress Notes (Signed)
Brittany Cherry - 45 y.o. female MRN [id number]  Date of birth: [birth year]  Office Visit Note: Visit Date: 03/25/2016 PCP: No PCP Per Patient Referred by: No ref. provider found  Subjective: Chief Complaint  Patient presents with  . Right Knee - New Patient (Initial Visit)  . Left Knee - New Patient (Initial Visit)  . Follow-up    Patient states bilateral knee for a long time, has had an injection in both knees.  States left is hurting more than the right knee, no swelling.  Hurts more when laying down.   HPI: Long-standing history of lateral knee pain that is worsened significantly over the past 2 months. She is undergone injection therapy at an outside provider with moderate improvement in her symptoms. This is been approximately one year since her last injection. She has previously had an MRI on her right knee that showed a horizontal tear with no surgical intervention planned. She is reporting only pain at this time with no significant locking or giving way of the knees. Her left knee is more symptomatic than her right at this time which is atypical. She denies any significant swelling. Anti-inflammatories have not been significantly beneficial. ROS: Otherwise per HPI.   Clinical History: No specialty comments available.  She reports that she has been smoking.  She has never used smokeless tobacco.  No results for input(s): HGBA1C, LABURIC in the last 8760 hours.  Assessment & Plan: Visit Diagnoses:    ICD-9-CM ICD-10-CM   1. Chronic pain of both knees 719.46 M25.561 Large Joint Injection/Arthrocentesis   338.29 M25.562 Large Joint Injection/Arthrocentesis    G89.29     Plan: Bilateral knee injections from anterior lateral approach performed today. Patient tolerated this procedure well & will plan follow-up on an as-needed basis. Continue with VMO strengthening as well as avoidance of exacerbating activities. Follow-up: Return if symptoms worsen or fail to improve.  Meds: No  orders of the defined types were placed in this encounter.  Procedures: No notes on file  Right Knee injection Date/Time: 03/25/2016 10:52 AM Performed by: Teresa Coombs D Authorized by: Teresa Coombs D   Site marked: the procedure site was marked   Timeout: prior to procedure the correct patient, procedure, and site was verified   Location:  Knee Site:  R knee Prep: patient was prepped and draped in usual sterile fashion   Needle Size:  22 G Needle Length:  1.5 inches Ultrasound Guidance: No   Fluoroscopic Guidance: No   Arthrogram: No   Medications:  3 mL lidocaine 1 %; 40 mg methylPREDNISolone acetate 40 MG/ML Aspiration Attempted: No   Patient tolerance:  Patient tolerated the procedure well with no immediate complications Left Knee injection Date/Time: 03/25/2016 10:53 AM Performed by: Teresa Coombs D Authorized by: Teresa Coombs D   Site marked: the procedure site was marked   Timeout: prior to procedure the correct patient, procedure, and site was verified   Location:  Knee Site:  L knee Prep: patient was prepped and draped in usual sterile fashion   Needle Size:  22 G Needle Length:  1.5 inches Ultrasound Guidance: No   Fluoroscopic Guidance: No   Arthrogram: No   Medications:  3 mL lidocaine 1 %; 40 mg methylPREDNISolone acetate 40 MG/ML Aspiration Attempted: No   Patient tolerance:  Patient tolerated the procedure well with no immediate complications     Objective:  VS:  HT:5\' 2"  (157.5 cm)   WT:200 lb (90.7 kg)  BMI:36.7    BP:121/77  HR:73bpm  TEMP: ( )  RESP:  Physical Exam:  Adult female. Alert and appropriate.  In no acute distress.  Lower extremities are overall well aligned with no significant deformity. No significant swelling.  Distal pulses 2+/4. No significant bruising/ecchymosis or erythema the skin Bilateral knees: Large knees but overall well aligned. No significant effusion bilaterally. She has generalized tenderness to palpation  along the medial & lateral joint lines of both knees. No focal palpable defect. Pain with McMurray's bilaterally however no significant mechanical symptoms appreciated. She is unable to perform [facility] bilaterally. Extensor mechanism is intact. She's otherwise ligamentously stable. Imaging: No results found.  Past Medical/Family/Surgical/Social History: Medications & Allergies reviewed per EMR Patient Active Problem List   Diagnosis Date Noted  . NSVD (normal spontaneous vaginal delivery) 08/20/2013  . S/P tubal ligation 08/20/2013  . PROM (premature rupture of membranes) 08/18/2013  . Elderly multigravida with antepartum condition or complication 123XX123   Past Medical History:  Diagnosis Date  . AMA (advanced maternal age) multigravida 59+   . Depression   . GERD (gastroesophageal reflux disease)   . Hyperemesis gravidarum    No family history on file. Past Surgical History:  Procedure Laterality Date  . DILATION AND CURETTAGE OF UTERUS    . TUBAL LIGATION Bilateral 08/18/2013   Procedure: POST PARTUM TUBAL LIGATION;  Surgeon: Florian Buff, MD;  Location: [facility];  Service: Gynecology;  Laterality: Bilateral;  . WISDOM TOOTH EXTRACTION     Social History   Occupational History  . Not on file.   Social History Main Topics  . Smoking status: Current Every Day Smoker  . Smokeless tobacco: Never Used  . Alcohol use Yes  . Drug use: No  . Sexual activity: Yes

## 2016-03-25 NOTE — Patient Instructions (Signed)
I am transferring practices as of January 1st  to [facility] at [place].  This is a great opportunity & I am saddened to be leaving [facility] however & excited for new opportunities. I will continue to be seeing patients at [facility] through the end of December. I am happy to see you at the new location but also am confident that you are in great hands with the excellent providers here at [facility].  We are not currently scheduling patients at the new location at this time but if you look on [facility] website a contact information should be available there closer to January. Additionally [email or web address] will have information when it becomes available.    The telephone number will be [phone]  - Nobody will be answering this phone number until closer to January. 

## 2016-06-07 ENCOUNTER — Encounter (INDEPENDENT_AMBULATORY_CARE_PROVIDER_SITE_OTHER): Payer: Self-pay

## 2016-06-07 ENCOUNTER — Encounter (INDEPENDENT_AMBULATORY_CARE_PROVIDER_SITE_OTHER): Payer: Self-pay | Admitting: Orthopedic Surgery

## 2016-06-07 ENCOUNTER — Ambulatory Visit (INDEPENDENT_AMBULATORY_CARE_PROVIDER_SITE_OTHER): Payer: PRIVATE HEALTH INSURANCE | Admitting: Orthopedic Surgery

## 2016-06-07 DIAGNOSIS — M25561 Pain in right knee: Secondary | ICD-10-CM | POA: Diagnosis not present

## 2016-06-07 DIAGNOSIS — G8929 Other chronic pain: Secondary | ICD-10-CM | POA: Diagnosis not present

## 2016-06-07 DIAGNOSIS — M25562 Pain in left knee: Secondary | ICD-10-CM

## 2016-06-07 MED ORDER — METHYLPREDNISOLONE ACETATE 40 MG/ML IJ SUSP
40.0000 mg | INTRAMUSCULAR | Status: AC | PRN
  Administered 2016-06-07: 40 mg via INTRA_ARTICULAR

## 2016-06-07 MED ORDER — LIDOCAINE HCL 1 % IJ SOLN
5.0000 mL | INTRAMUSCULAR | Status: AC | PRN
  Administered 2016-06-07: 5 mL

## 2016-06-07 MED ORDER — BUPIVACAINE HCL 0.25 % IJ SOLN
4.0000 mL | INTRAMUSCULAR | Status: AC | PRN
  Administered 2016-06-07: 4 mL via INTRA_ARTICULAR

## 2016-06-07 NOTE — Progress Notes (Signed)
Office Visit Note   Patient: Brittany Cherry           Date of Birth: 1970/09/14           MRN: [id number] Visit Date: 06/07/2016 Requested by: No referring provider defined for this encounter. PCP: No PCP Per Patient  Subjective: Chief Complaint  Patient presents with  . Left Knee - Pain  . Right Knee - Pain  . Right Foot - Pain    HPI Brittany Cherry is a 46 year old patient with bilateral knee pain left greater than right today.  Last saw Dr. Paulla Fore 03/25/2016.  Has had cortisone injections in the past which helped.  She has been to physical therapy.  She has had an MRI scan of the right knee which shows horizontal tear of the meniscus.  She is not really in a great place for any type of surgery now.  She is requesting another injection in both knees.  She has iced may have some plantar fasciitis.  She is to stretch some for that.  She has a 2-year-old autistic child at home.              Review of Systems All systems reviewed are negative as they relate to the chief complaint within the history of present illness.  Patient denies  fevers or chills.    Assessment & Plan: Visit Diagnoses:  1. Chronic pain of both knees     Plan: Impression bilateral knee degenerative changes with meniscal pathology in one knee to chest been scanned on the right-hand side.  May have some degenerative meniscal pathology in the left knee.  Plan at this time is for injection of both knees per her request.  We'll see how she does with those injections.  Could consider monitor this injections if her symptoms return.  Follow-Up Instructions: Return if symptoms worsen or fail to improve.   Orders:  No orders of the defined types were placed in this encounter.  No orders of the defined types were placed in this encounter.     Procedures: Large Joint Inj Date/Time: 06/07/2016 4:59 PM Performed by: Meredith Pel Authorized by: Meredith Pel   Consent Given by:  Patient Site marked: the  procedure site was marked   Timeout: prior to procedure the correct patient, procedure, and site was verified   Indications:  Pain, joint swelling and diagnostic evaluation Location:  Knee Site:  R knee Prep: patient was prepped and draped in usual sterile fashion   Needle Size:  18 G Needle Length:  1.5 inches Approach:  Superolateral Ultrasound Guidance: No   Fluoroscopic Guidance: No   Arthrogram: No   Medications:  5 mL lidocaine 1 %; 4 mL bupivacaine 0.25 %; 40 mg methylPREDNISolone acetate 40 MG/ML Patient tolerance:  Patient tolerated the procedure well with no immediate complications Large Joint Inj Date/Time: 06/07/2016 5:00 PM Performed by: Meredith Pel Authorized by: Meredith Pel   Consent Given by:  Patient Site marked: the procedure site was marked   Timeout: prior to procedure the correct patient, procedure, and site was verified   Indications:  Pain, joint swelling and diagnostic evaluation Location:  Knee Site:  L knee Prep: patient was prepped and draped in usual sterile fashion   Needle Size:  18 G Needle Length:  1.5 inches Approach:  Superolateral Ultrasound Guidance: No   Fluoroscopic Guidance: No   Arthrogram: No   Medications:  5 mL lidocaine 1 %; 4 mL bupivacaine 0.25 %; 40  mg methylPREDNISolone acetate 40 MG/ML Patient tolerance:  Patient tolerated the procedure well with no immediate complications     Clinical Data: No additional findings.  Objective: Vital Signs: There were no vitals taken for this visit.  Physical Exam   Constitutional: Patient appears well-developed HEENT:  Head: Normocephalic Eyes:EOM are normal Neck: Normal range of motion Cardiovascular: Normal rate Pulmonary/chest: Effort normal Neurologic: Patient is alert Skin: Skin is warm Psychiatric: Patient has normal mood and affect    Ortho Exam orthopedic exam demonstrates full active and passive range of motion of both knees with medial and lateral  joint line tenderness.  Pedal pulses palpable.  No groin pain interlocks rotation leg.  Collateral cruciate ligaments stable both knees.  Has mild medial and lateral joint line tenderness on both knees but no real patellofemoral crepitus in either knee.  Negative patellar apprehension.  No other masses lymph adenopathy or skin changes noted in the knee region  Specialty Comments:  No specialty comments available.  Imaging: No results found.   PMFS History: Patient Active Problem List   Diagnosis Date Noted  . Chronic pain of both knees 06/07/2016  . NSVD (normal spontaneous vaginal delivery) 08/20/2013  . S/P tubal ligation 08/20/2013  . PROM (premature rupture of membranes) 08/18/2013  . Elderly multigravida with antepartum condition or complication 123XX123   Past Medical History:  Diagnosis Date  . AMA (advanced maternal age) multigravida 47+   . Depression   . GERD (gastroesophageal reflux disease)   . Hyperemesis gravidarum     No family history on file.  Past Surgical History:  Procedure Laterality Date  . DILATION AND CURETTAGE OF UTERUS    . TUBAL LIGATION Bilateral 08/18/2013   Procedure: POST PARTUM TUBAL LIGATION;  Surgeon: Florian Buff, MD;  Location: [facility];  Service: Gynecology;  Laterality: Bilateral;  . WISDOM TOOTH EXTRACTION     Social History   Occupational History  . Not on file.   Social History Main Topics  . Smoking status: Current Every Day Smoker  . Smokeless tobacco: Never Used  . Alcohol use Yes  . Drug use: No  . Sexual activity: Yes

## 2016-06-26 ENCOUNTER — Emergency Department (HOSPITAL_COMMUNITY)
Admission: EM | Admit: 2016-06-26 | Discharge: 2016-06-26 | Disposition: A | Discharge status: Home / Self Care | Payer: PRIVATE HEALTH INSURANCE | Attending: Emergency Medicine | Admitting: Emergency Medicine

## 2016-06-26 ENCOUNTER — Emergency Department (HOSPITAL_COMMUNITY): Payer: PRIVATE HEALTH INSURANCE

## 2016-06-26 DIAGNOSIS — Z79899 Other long term (current) drug therapy: Secondary | ICD-10-CM | POA: Insufficient documentation

## 2016-06-26 DIAGNOSIS — M25562 Pain in left knee: Secondary | ICD-10-CM | POA: Diagnosis not present

## 2016-06-26 DIAGNOSIS — F17200 Nicotine dependence, unspecified, uncomplicated: Secondary | ICD-10-CM | POA: Insufficient documentation

## 2016-06-26 DIAGNOSIS — G8929 Other chronic pain: Secondary | ICD-10-CM | POA: Diagnosis not present

## 2016-06-26 MED ORDER — KETOROLAC TROMETHAMINE 10 MG PO TABS: 10.0000 mg | ORAL_TABLET | Freq: Four times a day (QID) | ORAL | 0 refills | Status: DC | PRN

## 2016-06-26 MED ORDER — KETOROLAC TROMETHAMINE 30 MG/ML IJ SOLN
30.0000 mg | Freq: Once | INTRAMUSCULAR | Status: AC
  Administered 2016-06-26: 30 mg via INTRAMUSCULAR
  Filled 2016-06-26: qty 1

## 2016-06-26 NOTE — ED Triage Notes (Signed)
Pt. Stated, My left started hurting 2 days and I normally have knee problems for the last 3 years but this is different, its a different pain.

## 2016-06-26 NOTE — Discharge Instructions (Signed)
There does not appear to be an emergent cause for your symptoms at this time. Your exam, x-rays were very reassuring. He may take your medication, Toradol as prescribed. Do not take this with ibuprofen, Advil, other NSAIDs as they worked similarly. Continue supportive care at home and follow-up with your doctor for reevaluation as needed. Return to ED for any new or worsening symptoms as we discussed.

## 2016-06-26 NOTE — ED Provider Notes (Signed)
[facility] DEPT Provider Note   CSN: [id number] Arrival date & time: 06/26/16  1120  By signing my name below, I, Dora Sims, attest that this documentation has been prepared under the direction and in the presence of [employer], PA-C. Electronically Signed: Dora Sims, Scribe. 06/26/2016. 12:51 PM.  History   Chief Complaint Chief Complaint  Patient presents with  . Knee Pain    The history is provided by the patient. No language interpreter was used.    HPI Comments: Brittany Cherry is a 46 y.o. female with PMHx including chronic bilateral knee pain who presents to the Emergency Department complaining of constant, severe, sharp left knee pain for three days. She endorses associated swelling. Pt reports chronic knee pain at baseline but states her current pain is different. She endorses significant pain exacerbation with flexion and extension of her left knee. No recent falls or trauma to her left knee. Pt has applied ice to her left knee and taken ibuprofen regularly with Mild improvement of her current left knee pain. She notes she received cortisone injections in her bilateral knees two weeks ago and also had fluid drained from her left knee at this time. NKDA. Pt denies numbness/tingling, fever, chills, leg swelling, cardiopulmonary complaints or any other associated symptoms.  Past Medical History:  Diagnosis Date  . AMA (advanced maternal age) multigravida 73+   . Depression   . GERD (gastroesophageal reflux disease)   . Hyperemesis gravidarum     Patient Active Problem List   Diagnosis Date Noted  . Chronic pain of both knees 06/07/2016  . NSVD (normal spontaneous vaginal delivery) 08/20/2013  . S/P tubal ligation 08/20/2013  . PROM (premature rupture of membranes) 08/18/2013  . Elderly multigravida with antepartum condition or complication 53/61/4431    Past Surgical History:  Procedure Laterality Date  . DILATION AND CURETTAGE OF UTERUS    . TUBAL  LIGATION Bilateral 08/18/2013   Procedure: POST PARTUM TUBAL LIGATION;  Surgeon: Florian Buff, MD;  Location: [facility];  Service: Gynecology;  Laterality: Bilateral;  . WISDOM TOOTH EXTRACTION      OB History    Gravida Para Term Preterm AB Living   8 4 4   4 4    SAB TAB Ectopic Multiple Live Births   2 2     4        Home Medications    Prior to Admission medications   Medication Sig Start Date End Date Taking? Authorizing Provider  clonazePAM (KLONOPIN) 0.5 MG tablet Take 0.5 mg by mouth daily as needed for anxiety.    Historical Provider, MD  escitalopram (LEXAPRO) 10 MG tablet Take 10 mg by mouth daily.    Historical Provider, MD  ibuprofen (ADVIL,MOTRIN) 800 MG tablet Take 800 mg by mouth every 8 (eight) hours as needed.    Historical Provider, MD  ketorolac (TORADOL) 10 MG tablet Take 1 tablet (10 mg total) by mouth every 6 (six) hours as needed. 06/26/16   Comer Locket, PA-C  omeprazole (PRILOSEC) 10 MG capsule Take 10 mg by mouth daily.    Historical Provider, MD    Family History No family history on file.  Social History Social History  Substance Use Topics  . Smoking status: Current Every Day Smoker  . Smokeless tobacco: Never Used  . Alcohol use Yes     Allergies   Patient has no known allergies.   Review of Systems Review of Systems  A complete review of systems was obtained and all systems  are negative except as noted in the HPI and PMH.   Physical Exam Updated Vital Signs BP 125/72 (BP Location: Right Arm)   Pulse 79   Temp 98.2 F (36.8 C) (Oral)   Resp 17   Ht 5\' 2"  (1.575 m)   Wt 90.7 kg   LMP 06/08/2016 (Approximate)   SpO2 100%   BMI 36.58 kg/m   Physical Exam  Constitutional: She is oriented to person, place, and time. She appears well-developed and well-nourished. No distress.  HENT:  Head: Normocephalic and atraumatic.  Eyes: Conjunctivae and EOM are normal.  Neck: Neck supple. No tracheal deviation present.  Cardiovascular:  Normal rate.   Pulmonary/Chest: Effort normal. No respiratory distress.  Musculoskeletal: Normal range of motion. She exhibits no edema or deformity.  Left knee:Marland Kitchen Minimal tenderness to medial joint line. No appreciable effusion. No ligamentous laxity. Maintains full active range of motion. No skin abnormalities. Gait baseline, no ataxia.  Neurological: She is alert and oriented to person, place, and time.  Skin: Skin is warm and dry.  Psychiatric: She has a normal mood and affect. Her behavior is normal.  Nursing note and vitals reviewed.   ED Treatments / Results  Labs (all labs ordered are listed, but only abnormal results are displayed) Labs Reviewed - No data to display  EKG  EKG Interpretation None       Radiology Dg Knee Complete 4 Views Left  Result Date: 06/26/2016 CLINICAL DATA:  Chronic left knee pain, which has increased post steroid injection 2 days ago. EXAM: LEFT KNEE - COMPLETE 4+ VIEW COMPARISON:  None. FINDINGS: No evidence of fracture, or dislocation. There are mild 3 compartment osteoarthritic changes of the left knee. There is a small suprapatellar joint effusion. Soft tissues are unremarkable. 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 Electronically Signed   By: Fidela Salisbury M.D.   On: 06/26/2016 14:06    Procedures Procedures (including critical care time)  DIAGNOSTIC STUDIES: Oxygen Saturation is 100% on RA, normal by my interpretation.    COORDINATION OF CARE: 12:55 PM Discussed treatment plan with pt at bedside and pt agreed to plan.  Medications Ordered in ED Medications  ketorolac (TORADOL) 30 MG/ML injection 30 mg (30 mg Intramuscular Given 06/26/16 1312)     Initial Impression / Assessment and Plan / ED Course  I have reviewed the triage vital signs and the nursing notes.  Pertinent labs & imaging results that were available during my care of the patient were  reviewed by me and considered in my medical decision making (see chart for details).   patient with acute exacerbation of chronic left knee pain. Exam was reassuring. No evidence of infection, remains neurovascularly intact. X-rays reassuring. Expresses relief with Toradol IM. Prescription for same-discussed not using concomitantly with NSAIDs. Continue rice therapy. Follow-up with PCP. Return precautions discussed.  Final Clinical Impressions(s) / ED Diagnoses   Final diagnoses:  Acute pain of left knee    New Prescriptions New Prescriptions   KETOROLAC (TORADOL) 10 MG TABLET    Take 1 tablet (10 mg total) by mouth every 6 (six) hours as needed.    I personally performed the services described in this documentation, which was scribed in my presence. The recorded information has been reviewed and is accurate.    Comer Locket, PA-C 06/26/16 1419    Blanchie Dessert, MD 06/26/16 2007

## 2016-06-26 NOTE — ED Notes (Addendum)
Pt with hx of chronic knee pain. Had steroid injection at orthopedist's office on Friday. 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 States has increasing left knee pain and swelling.

## 2016-07-01 ENCOUNTER — Ambulatory Visit (INDEPENDENT_AMBULATORY_CARE_PROVIDER_SITE_OTHER): Payer: PRIVATE HEALTH INSURANCE | Admitting: Family

## 2016-07-01 DIAGNOSIS — G8929 Other chronic pain: Secondary | ICD-10-CM | POA: Diagnosis not present

## 2016-07-01 DIAGNOSIS — M25562 Pain in left knee: Secondary | ICD-10-CM

## 2016-07-01 MED ORDER — KETOROLAC TROMETHAMINE 10 MG PO TABS: 10.0000 mg | ORAL_TABLET | Freq: Three times a day (TID) | ORAL | 0 refills | Status: DC | PRN

## 2016-07-01 NOTE — Progress Notes (Signed)
Office Visit Note   Patient: Brittany Cherry           Date of Birth: [birth year]           MRN: [id number] Visit Date: 07/01/2016              Requested by: Fanny Bien, MD [address] [place], [place] [address] PCP: Rachell Cipro, MD  No chief complaint on file.   HPI: The patient is a 46 year old woman who presents today for evaluation of left knee pain. This has been chronic. She last had an aspiration with cortisone injection with Dr Marlou Sa about 4 weeks ago. In the interim did have a visit to the ED for acute worsening and swelling of the left knee. States since the ED visit has had a dramatic decrease in her swelling and her pain is about 25% better. Has been taking Toradol for pain has been using ice and rest. Would like to know "what's going on in there" has been wearing a knee brace off and on with relief.  States discussed him arthroscopy with Dr. Marlou Sa, at that time she was not able to proceed however due to increased pain states she would like to proceed at this time.  Assessment & Plan: Visit Diagnoses:  1. Chronic pain of left knee     Plan: We will proceed with MRI of the left knee she'll follow up to discuss the results with Dr. Marlou Sa.  Follow-Up Instructions: Return for p mri c dean.   Physical Exam  Constitutional: Appears well-developed.  Head: Normocephalic.  Eyes: EOM are normal.  Neck: Normal range of motion.  Cardiovascular: Normal rate.   Pulmonary/Chest: Effort normal.  Neurological: Is alert.  Skin: Skin is warm.  Psychiatric: Has a normal mood and affect. Antalgic gait. Left Knee Exam   Tenderness  The patient is experiencing tenderness in the lateral joint line, medial joint line and medial retinaculum.  Range of Motion  The patient has normal left knee ROM.  Tests  Varus: negative Valgus: negative  Other  Swelling: mild Effusion: no effusion present       Imaging: No results found.  Labs: Lab Results    Component Value Date   REPTSTATUS 08/06/2015 FINAL 08/03/2015   CULT NO GROUP A STREP (S.PYOGENES) ISOLATED 08/03/2015    Orders:  Orders Placed This Encounter  Procedures  . MR Knee Left w/o contrast   Meds ordered this encounter  Medications  . ketorolac (TORADOL) 10 MG tablet    Sig: Take 1 tablet (10 mg total) by mouth every 8 (eight) hours as needed.    Dispense:  30 tablet    Refill:  0     Procedures: No procedures performed  Clinical Data: No additional findings.  ROS: Review of Systems  Constitutional: Negative for chills and fever.  Musculoskeletal: Positive for arthralgias, gait problem, joint swelling and myalgias.    Objective: Vital Signs: LMP 06/08/2016 (Approximate)   Specialty Comments:  No specialty comments available.  PMFS History: Patient Active Problem List   Diagnosis Date Noted  . Chronic pain of both knees 06/07/2016  . NSVD (normal spontaneous vaginal delivery) 08/20/2013  . S/P tubal ligation 08/20/2013  . PROM (premature rupture of membranes) 08/18/2013  . Elderly multigravida with antepartum condition or complication 78/24/2353   Past Medical History:  Diagnosis Date  . AMA (advanced maternal age) multigravida 43+   . Depression   . GERD (gastroesophageal reflux disease)   . Hyperemesis gravidarum  No family history on file.  Past Surgical History:  Procedure Laterality Date  . DILATION AND CURETTAGE OF UTERUS    . TUBAL LIGATION Bilateral 08/18/2013   Procedure: POST PARTUM TUBAL LIGATION;  Surgeon: Florian Buff, MD;  Location: [facility];  Service: Gynecology;  Laterality: Bilateral;  . WISDOM TOOTH EXTRACTION     Social History   Occupational History  . Not on file.   Social History Main Topics  . Smoking status: Current Every Day Smoker  . Smokeless tobacco: Never Used  . Alcohol use Yes  . Drug use: No  . Sexual activity: Yes

## 2016-07-15 ENCOUNTER — Ambulatory Visit
Admission: RE | Admit: 2016-07-15 | Discharge: 2016-07-15 | Disposition: A | Discharge status: Home / Self Care | Payer: PRIVATE HEALTH INSURANCE | Source: Ambulatory Visit | Attending: Family | Admitting: Family

## 2016-07-15 DIAGNOSIS — M25562 Pain in left knee: Principal | ICD-10-CM

## 2016-07-15 DIAGNOSIS — G8929 Other chronic pain: Secondary | ICD-10-CM

## 2016-07-15 NOTE — Progress Notes (Signed)
Can  you have her come in for mri review with dean

## 2016-07-18 ENCOUNTER — Other Ambulatory Visit: Payer: PRIVATE HEALTH INSURANCE

## 2016-07-18 NOTE — Progress Notes (Signed)
Patient is already scheduled for MRI review on 04/11/1/8.

## 2016-07-20 ENCOUNTER — Encounter (INDEPENDENT_AMBULATORY_CARE_PROVIDER_SITE_OTHER): Payer: Self-pay | Admitting: Orthopedic Surgery

## 2016-07-20 ENCOUNTER — Ambulatory Visit (INDEPENDENT_AMBULATORY_CARE_PROVIDER_SITE_OTHER): Payer: PRIVATE HEALTH INSURANCE | Admitting: Orthopedic Surgery

## 2016-07-20 DIAGNOSIS — M25562 Pain in left knee: Secondary | ICD-10-CM | POA: Diagnosis not present

## 2016-07-20 DIAGNOSIS — S83242D Other tear of medial meniscus, current injury, left knee, subsequent encounter: Secondary | ICD-10-CM | POA: Diagnosis not present

## 2016-07-20 NOTE — Progress Notes (Signed)
Office Visit Note   Patient: Brittany Cherry           Date of Birth: 09/19/1970           MRN: [id number] Visit Date: 07/20/2016 Requested by: Fanny Bien, MD [address] [place], [place] [address] PCP: Rachell Cipro, MD  Subjective: Chief Complaint  Patient presents with  . Left Knee - Follow-up    HPI: Brittany Cherry is a 46 year old patient with left knee pain.  Since of Brittany Cherry she's had an MRI scan which is reviewed.  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  She also has what appears to be an anterior cruciate ligament cyst that intact anterior cruciate ligament itself.  Fairly minimal degenerative changes in the patellofemoral and lateral compartments.  Scan is reviewed with the patient.  Injection last clinic visit helped her for 2 days then she had recurrence of symptoms.  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  She uses a brace and Ace wrap.  No personal history of DVT or pulmonary embolism.              ROS: All systems reviewed are negative as they relate to the chief complaint within the history of present illness.  Patient denies  fevers or chills.   Assessment & Plan: Visit Diagnoses:  1. Left knee pain, unspecified chronicity   2. Other tear of medial meniscus, current injury, left knee, subsequent encounter     Plan: Impression is symptomatic left knee medial meniscal tear.  Plan arthroscopy with debridement of that meniscal tear.  She also has some chondromalacia in the medial compartment but I think arthroscopy is indicated in her case because of failure of conservative management and persistent and continued symptoms.  I think she will likely be out of work at least 6 or 7 weeks.  Risk and benefits are discussed including but not limited to infection or vessel damage knee stiffness as well as incomplete pain relief.  All questions answered.  Early rehabilitative goals also  discussed.  Follow-Up Instructions: No Follow-up on file.   Orders:  No orders of the defined types were placed in this encounter.  No orders of the defined types were placed in this encounter.     Procedures: No procedures performed   Clinical Data: No additional findings.  Objective: Vital Signs: There were no vitals taken for this visit.  Physical Exam:   Constitutional: Patient appears well-developed HEENT:  Head: Normocephalic Eyes:EOM are normal Neck: Normal range of motion Cardiovascular: Normal rate Pulmonary/chest: Effort normal Neurologic: Patient is alert Skin: Skin is warm Psychiatric: Patient has normal mood and affect    Ortho Exam: Left knee exam demonstrates recurrent effusion medial and lateral joint line tenderness with positive murmur compression testing palpable pedal pulses good range of motion stable collateral and cruciate ligaments no posterior lateral rotatory instability no other masses lymph adenopathy or skin changes noted in the left knee region  Specialty Comments:  No specialty comments available.  Imaging: No results found.   PMFS History: Patient Active Problem List   Diagnosis Date Noted  . Chronic pain of both knees 06/07/2016  . NSVD (normal spontaneous vaginal delivery) 08/20/2013  . S/P tubal ligation 08/20/2013  . PROM (premature rupture of membranes) 08/18/2013  . Elderly multigravida with antepartum condition or complication 09/47/0962   Past Medical History:  Diagnosis Date  . AMA (advanced maternal  age) multigravida 67+   . Depression   . GERD (gastroesophageal reflux disease)   . Hyperemesis gravidarum     No family history on file.  Past Surgical History:  Procedure Laterality Date  . DILATION AND CURETTAGE OF UTERUS    . TUBAL LIGATION Bilateral 08/18/2013   Procedure: POST PARTUM TUBAL LIGATION;  Surgeon: Florian Buff, MD;  Location: [facility];  Service: Gynecology;  Laterality: Bilateral;  . WISDOM TOOTH  EXTRACTION     Social History   Occupational History  . Not on file.   Social History Main Topics  . Smoking status: Current Every Day Smoker  . Smokeless tobacco: Never Used  . Alcohol use Yes  . Drug use: No  . Sexual activity: Yes

## 2016-08-01 DIAGNOSIS — S83242D Other tear of medial meniscus, current injury, left knee, subsequent encounter: Secondary | ICD-10-CM | POA: Diagnosis not present

## 2016-08-03 ENCOUNTER — Inpatient Hospital Stay (INDEPENDENT_AMBULATORY_CARE_PROVIDER_SITE_OTHER): Payer: PRIVATE HEALTH INSURANCE | Admitting: Orthopedic Surgery

## 2016-08-08 ENCOUNTER — Encounter (INDEPENDENT_AMBULATORY_CARE_PROVIDER_SITE_OTHER): Payer: Self-pay | Admitting: Orthopedic Surgery

## 2016-08-08 ENCOUNTER — Ambulatory Visit (INDEPENDENT_AMBULATORY_CARE_PROVIDER_SITE_OTHER): Payer: PRIVATE HEALTH INSURANCE | Admitting: Orthopedic Surgery

## 2016-08-08 DIAGNOSIS — Z9889 Other specified postprocedural states: Secondary | ICD-10-CM

## 2016-08-08 NOTE — Progress Notes (Signed)
   Post-Op Visit Note   Patient: Brittany Cherry           Date of Birth: 1970-09-19           MRN: [id number] Visit Date: 08/08/2016 PCP: Rachell Cipro, MD   Assessment & Plan:  Chief Complaint:  Chief Complaint  Patient presents with  . Left Knee - Routine Post Op   Visit Diagnoses:  1. S/P medial meniscus repair of left knee     Plan: Brittany Cherry is a 46 year old patient 1 week out left knee partial medial meniscectomy.  Taking oxycodone and Robaxin for pain.  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  On exam she has flexion to 90 extension lacks about 5 mild effusion is present plan is to work on a stationary bike and quad strengthening exercises she'll be out of work for at least 4 more weeks I'll see her back at that time for clinical recheck  Follow-Up Instructions: Return in about 4 weeks (around 09/05/2016).   Orders:  No orders of the defined types were placed in this encounter.  No orders of the defined types were placed in this encounter.   Imaging: No results found.  PMFS History: Patient Active Problem List   Diagnosis Date Noted  . Chronic pain of both knees 06/07/2016  . NSVD (normal spontaneous vaginal delivery) 08/20/2013  . S/P tubal ligation 08/20/2013  . PROM (premature rupture of membranes) 08/18/2013  . Elderly multigravida with antepartum condition or complication 09/40/7680   Past Medical History:  Diagnosis Date  . AMA (advanced maternal age) multigravida 59+   . Depression   . GERD (gastroesophageal reflux disease)   . Hyperemesis gravidarum     No family history on file.  Past Surgical History:  Procedure Laterality Date  . DILATION AND CURETTAGE OF UTERUS    . TUBAL LIGATION Bilateral 08/18/2013   Procedure: POST PARTUM TUBAL LIGATION;  Surgeon: Florian Buff, MD;  Location: [facility];  Service: Gynecology;  Laterality: Bilateral;  . WISDOM TOOTH EXTRACTION     Social History   Occupational History  . Not on file.   Social History  Main Topics  . Smoking status: Current Every Day Smoker  . Smokeless tobacco: Never Used  . Alcohol use Yes  . Drug use: No  . Sexual activity: Yes

## 2016-09-07 ENCOUNTER — Encounter (INDEPENDENT_AMBULATORY_CARE_PROVIDER_SITE_OTHER): Payer: Self-pay | Admitting: Orthopedic Surgery

## 2016-09-07 ENCOUNTER — Ambulatory Visit (INDEPENDENT_AMBULATORY_CARE_PROVIDER_SITE_OTHER): Payer: PRIVATE HEALTH INSURANCE | Admitting: Orthopedic Surgery

## 2016-09-07 ENCOUNTER — Encounter (INDEPENDENT_AMBULATORY_CARE_PROVIDER_SITE_OTHER): Payer: Self-pay

## 2016-09-07 DIAGNOSIS — Z9889 Other specified postprocedural states: Secondary | ICD-10-CM

## 2016-09-07 MED ORDER — OXYCODONE HCL 5 MG PO TABS: 5.0000 mg | ORAL_TABLET | Freq: Four times a day (QID) | ORAL | 0 refills | Status: DC | PRN

## 2016-09-07 NOTE — Progress Notes (Signed)
   Post-Op Visit Note   Patient: Brittany Cherry           Date of Birth: April 14, 1970           MRN: [id number] Visit Date: 09/07/2016 PCP: Fanny Bien, MD   Assessment & Plan:  Chief Complaint:  Chief Complaint  Patient presents with  . Left Knee - Pain, Follow-up   Visit Diagnoses: No diagnosis found.  Plan: Brittany Cherry is a 46 year old patient 5 weeks out left knee arthroscopy.  She had posterior medial meniscectomy done 08/01/2016.  States that they'll level for her at times at night.  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  She is taking ibuprofen and doing home exercises.  On exam she has mild effusion and good range of motion improving quad strength does have some medial joint line tenderness.  Plan is to aspirate the knee today.  Got about 35 mL out and injected 4 mL of Marcaine 1 mL of Depo-Medrol.  I will keep her out of work for 4 more weeks continue to work on quad strengthening refill pain meds 1 follow-up in 4 weeks for clinical recheck.  Should be able to return to work at that time.  Follow-Up Instructions: No Follow-up on file.   Orders:  No orders of the defined types were placed in this encounter.  No orders of the defined types were placed in this encounter.   Imaging: No results found.  PMFS History: Patient Active Problem List   Diagnosis Date Noted  . Chronic pain of both knees 06/07/2016  . NSVD (normal spontaneous vaginal delivery) 08/20/2013  . S/P tubal ligation 08/20/2013  . PROM (premature rupture of membranes) 08/18/2013  . Elderly multigravida with antepartum condition or complication 44/31/5400   Past Medical History:  Diagnosis Date  . AMA (advanced maternal age) multigravida 66+   . Depression   . GERD (gastroesophageal reflux disease)   . Hyperemesis gravidarum     No family history on file.  Past Surgical History:  Procedure Laterality Date  . DILATION AND CURETTAGE OF UTERUS    . TUBAL LIGATION  Bilateral 08/18/2013   Procedure: POST PARTUM TUBAL LIGATION;  Surgeon: Florian Buff, MD;  Location: [facility];  Service: Gynecology;  Laterality: Bilateral;  . WISDOM TOOTH EXTRACTION     Social History   Occupational History  . Not on file.   Social History Main Topics  . Smoking status: Current Every Day Smoker  . Smokeless tobacco: Never Used  . Alcohol use Yes  . Drug use: No  . Sexual activity: Yes

## 2016-10-05 ENCOUNTER — Ambulatory Visit (INDEPENDENT_AMBULATORY_CARE_PROVIDER_SITE_OTHER): Payer: PRIVATE HEALTH INSURANCE | Admitting: Orthopedic Surgery

## 2016-10-05 ENCOUNTER — Encounter (INDEPENDENT_AMBULATORY_CARE_PROVIDER_SITE_OTHER): Payer: Self-pay | Admitting: Orthopedic Surgery

## 2016-10-05 DIAGNOSIS — G8929 Other chronic pain: Secondary | ICD-10-CM

## 2016-10-05 DIAGNOSIS — M25562 Pain in left knee: Secondary | ICD-10-CM

## 2016-10-05 MED ORDER — HYDROCODONE-ACETAMINOPHEN 5-325 MG PO TABS: 1.0000 | ORAL_TABLET | Freq: Two times a day (BID) | ORAL | 0 refills | Status: DC | PRN

## 2016-10-06 NOTE — Progress Notes (Signed)
   Post-Op Visit Note   Patient: Brittany Cherry           Date of Birth: October 28, 1970           MRN: [id number] Visit Date: 10/05/2016 PCP: Fanny Bien, MD   Assessment & Plan:  Chief Complaint:  Chief Complaint  Patient presents with  . Left Knee - Follow-up   Visit Diagnoses: No diagnosis found.  Plan: [name] is a 46 year old patient 2 months out left knee arthroscopy.  Doing well.  Exercising at home daily.  The injection and aspiration helped last clinic visit.  She wants to return to work.  She is doing biking for exercising.  Taking ibuprofen.  On exam she has trace effusion and excellent range of motion improving quad strength.  Plan is to let her return to work Friday the first week back I want her working only 8 hour shifts and regular duty.  Norco prescribed #30 with no refill follow-up as needed  Follow-Up Instructions: Return if symptoms worsen or fail to improve.   Orders:  No orders of the defined types were placed in this encounter.  Meds ordered this encounter  Medications  . HYDROcodone-acetaminophen (NORCO/VICODIN) 5-325 MG tablet    Sig: Take 1 tablet by mouth every 12 (twelve) hours as needed for moderate pain.    Dispense:  30 tablet    Refill:  0    Imaging: No results found.  PMFS History: Patient Active Problem List   Diagnosis Date Noted  . Chronic pain of both knees 06/07/2016  . NSVD (normal spontaneous vaginal delivery) 08/20/2013  . S/P tubal ligation 08/20/2013  . PROM (premature rupture of membranes) 08/18/2013  . Elderly multigravida with antepartum condition or complication 93/81/0175   Past Medical History:  Diagnosis Date  . AMA (advanced maternal age) multigravida 18+   . Depression   . GERD (gastroesophageal reflux disease)   . Hyperemesis gravidarum     No family history on file.  Past Surgical History:  Procedure Laterality Date  . DILATION AND CURETTAGE OF UTERUS    . TUBAL LIGATION Bilateral 08/18/2013   Procedure: POST PARTUM TUBAL LIGATION;  Surgeon: Florian Buff, MD;  Location: [facility];  Service: Gynecology;  Laterality: Bilateral;  . WISDOM TOOTH EXTRACTION     Social History   Occupational History  . Not on file.   Social History Main Topics  . Smoking status: Current Every Day Smoker  . Smokeless tobacco: Never Used  . Alcohol use Yes  . Drug use: No  . Sexual activity: Yes

## 2017-02-26 ENCOUNTER — Observation Stay (HOSPITAL_COMMUNITY)
Admission: EM | Admit: 2017-02-26 | Discharge: 2017-02-27 | Disposition: A | Discharge status: Home / Self Care | Payer: PRIVATE HEALTH INSURANCE | Attending: Internal Medicine | Admitting: Internal Medicine

## 2017-02-26 ENCOUNTER — Other Ambulatory Visit: Payer: Self-pay

## 2017-02-26 ENCOUNTER — Encounter (HOSPITAL_COMMUNITY): Payer: Self-pay | Admitting: Emergency Medicine

## 2017-02-26 DIAGNOSIS — K2960 Other gastritis without bleeding: Secondary | ICD-10-CM | POA: Diagnosis not present

## 2017-02-26 DIAGNOSIS — K635 Polyp of colon: Secondary | ICD-10-CM

## 2017-02-26 DIAGNOSIS — K625 Hemorrhage of anus and rectum: Secondary | ICD-10-CM

## 2017-02-26 DIAGNOSIS — K5900 Constipation, unspecified: Secondary | ICD-10-CM | POA: Diagnosis not present

## 2017-02-26 DIAGNOSIS — F339 Major depressive disorder, recurrent, unspecified: Secondary | ICD-10-CM

## 2017-02-26 DIAGNOSIS — D62 Acute posthemorrhagic anemia: Secondary | ICD-10-CM

## 2017-02-26 DIAGNOSIS — N920 Excessive and frequent menstruation with regular cycle: Secondary | ICD-10-CM

## 2017-02-26 DIAGNOSIS — Z79899 Other long term (current) drug therapy: Secondary | ICD-10-CM | POA: Insufficient documentation

## 2017-02-26 DIAGNOSIS — D125 Benign neoplasm of sigmoid colon: Secondary | ICD-10-CM | POA: Insufficient documentation

## 2017-02-26 DIAGNOSIS — D509 Iron deficiency anemia, unspecified: Secondary | ICD-10-CM | POA: Diagnosis not present

## 2017-02-26 DIAGNOSIS — M25562 Pain in left knee: Secondary | ICD-10-CM

## 2017-02-26 DIAGNOSIS — G8929 Other chronic pain: Secondary | ICD-10-CM

## 2017-02-26 DIAGNOSIS — Z791 Long term (current) use of non-steroidal anti-inflammatories (NSAID): Secondary | ICD-10-CM

## 2017-02-26 DIAGNOSIS — K219 Gastro-esophageal reflux disease without esophagitis: Secondary | ICD-10-CM | POA: Insufficient documentation

## 2017-02-26 DIAGNOSIS — F17210 Nicotine dependence, cigarettes, uncomplicated: Secondary | ICD-10-CM

## 2017-02-26 DIAGNOSIS — K3189 Other diseases of stomach and duodenum: Secondary | ICD-10-CM | POA: Insufficient documentation

## 2017-02-26 DIAGNOSIS — R634 Abnormal weight loss: Secondary | ICD-10-CM | POA: Insufficient documentation

## 2017-02-26 DIAGNOSIS — Z9889 Other specified postprocedural states: Secondary | ICD-10-CM

## 2017-02-26 DIAGNOSIS — M549 Dorsalgia, unspecified: Secondary | ICD-10-CM

## 2017-02-26 DIAGNOSIS — F17200 Nicotine dependence, unspecified, uncomplicated: Secondary | ICD-10-CM | POA: Diagnosis not present

## 2017-02-26 DIAGNOSIS — D649 Anemia, unspecified: Secondary | ICD-10-CM | POA: Diagnosis present

## 2017-02-26 DIAGNOSIS — F329 Major depressive disorder, single episode, unspecified: Secondary | ICD-10-CM | POA: Diagnosis not present

## 2017-02-26 DIAGNOSIS — R1013 Epigastric pain: Secondary | ICD-10-CM

## 2017-02-26 LAB — COMPREHENSIVE METABOLIC PANEL
ALBUMIN: 3.5 g/dL (ref 3.5–5.0)
ALT: 12 U/L — ABNORMAL LOW (ref 14–54)
ANION GAP: 7 (ref 5–15)
AST: 11 U/L — AB (ref 15–41)
Alkaline Phosphatase: 62 U/L (ref 38–126)
BUN: 8 mg/dL (ref 6–20)
CHLORIDE: 107 mmol/L (ref 101–111)
CO2: 22 mmol/L (ref 22–32)
Calcium: 8.7 mg/dL — ABNORMAL LOW (ref 8.9–10.3)
Creatinine, Ser: 0.81 mg/dL (ref 0.44–1.00)
GFR calc Af Amer: >60 mL/min (ref >60)
GFR calc non Af Amer: >60 mL/min (ref >60)
GLUCOSE: 93 mg/dL (ref 65–99)
POTASSIUM: 3.8 mmol/L (ref 3.5–5.1)
Sodium: 136 mmol/L (ref 135–145)
TOTAL PROTEIN: 6.7 g/dL (ref 6.5–8.1)
Total Bilirubin: 0.4 mg/dL (ref 0.3–1.2)

## 2017-02-26 LAB — CBC WITH DIFFERENTIAL/PLATELET
BASOS ABS: 0.1 10*3/uL (ref 0.0–0.1)
BASOS PCT: 1 %
EOS ABS: 0.3 10*3/uL (ref 0.0–0.7)
Eosinophils Relative: 5 %
HEMATOCRIT: 22.9 % — AB (ref 36.0–46.0)
HEMOGLOBIN: 6.6 g/dL — AB (ref 12.0–15.0)
Lymphocytes Relative: 20 %
Lymphs Abs: 1.3 10*3/uL (ref 0.7–4.0)
MCH: 15.4 pg — ABNORMAL LOW (ref 26.0–34.0)
MCHC: 28.8 g/dL — AB (ref 30.0–36.0)
MCV: 53.5 fL — ABNORMAL LOW (ref 78.0–100.0)
MONOS PCT: 6 %
Monocytes Absolute: 0.4 10*3/uL (ref 0.1–1.0)
NEUTROS ABS: 4.5 10*3/uL (ref 1.7–7.7)
NEUTROS PCT: 68 %
Platelets: 278 10*3/uL (ref 150–400)
RBC: 4.28 MIL/uL (ref 3.87–5.11)
RDW: 20.5 % — ABNORMAL HIGH (ref 11.5–15.5)
WBC: 6.6 10*3/uL (ref 4.0–10.5)

## 2017-02-26 LAB — CBC
HCT: 28.6 % — ABNORMAL LOW (ref 36.0–46.0)
HEMATOCRIT: 21.8 % — AB (ref 36.0–46.0)
Hemoglobin: 6.3 g/dL — CL (ref 12.0–15.0)
Hemoglobin: 8.5 g/dL — ABNORMAL LOW (ref 12.0–15.0)
MCH: 15.7 pg — ABNORMAL LOW (ref 26.0–34.0)
MCH: 17.6 pg — ABNORMAL LOW (ref 26.0–34.0)
MCHC: 28.9 g/dL — AB (ref 30.0–36.0)
MCHC: 29.7 g/dL — ABNORMAL LOW (ref 30.0–36.0)
MCV: 54.2 fL — AB (ref 78.0–100.0)
MCV: 59.1 fL — AB (ref 78.0–100.0)
PLATELETS: 239 10*3/uL (ref 150–400)
Platelets: 258 10*3/uL (ref 150–400)
RBC: 4.02 MIL/uL (ref 3.87–5.11)
RBC: 4.84 MIL/uL (ref 3.87–5.11)
RDW: 20.9 % — AB (ref 11.5–15.5)
RDW: 26.8 % — AB (ref 11.5–15.5)
WBC: 5.6 10*3/uL (ref 4.0–10.5)
WBC: 7.8 10*3/uL (ref 4.0–10.5)

## 2017-02-26 LAB — URINALYSIS, ROUTINE W REFLEX MICROSCOPIC
Bilirubin Urine: NEGATIVE
Glucose, UA: NEGATIVE mg/dL
Hgb urine dipstick: NEGATIVE
KETONES UR: NEGATIVE mg/dL
LEUKOCYTES UA: NEGATIVE
Nitrite: NEGATIVE
PROTEIN: NEGATIVE mg/dL
Specific Gravity, Urine: 1.01 (ref 1.005–1.030)
pH: 6 (ref 5.0–8.0)

## 2017-02-26 LAB — I-STAT BETA HCG BLOOD, ED (MC, WL, AP ONLY)

## 2017-02-26 LAB — PREPARE RBC (CROSSMATCH)

## 2017-02-26 LAB — FERRITIN: FERRITIN: 3 ng/mL — AB (ref 11–307)

## 2017-02-26 LAB — POC OCCULT BLOOD, ED: Fecal Occult Bld: NEGATIVE

## 2017-02-26 LAB — LIPASE, BLOOD: LIPASE: 20 U/L (ref 11–51)

## 2017-02-26 LAB — ABO/RH: ABO/RH(D): O POS

## 2017-02-26 MED ORDER — PROMETHAZINE HCL 25 MG PO TABS: 12.5000 mg | ORAL_TABLET | Freq: Four times a day (QID) | ORAL | Status: DC | PRN

## 2017-02-26 MED ORDER — PEG 3350-KCL-NA BICARB-NACL 420 G PO SOLR
4000.0000 mL | Freq: Once | ORAL | Status: AC
  Administered 2017-02-26: 4000 mL via ORAL
  Filled 2017-02-26: qty 4000

## 2017-02-26 MED ORDER — PANTOPRAZOLE SODIUM 40 MG IV SOLR
40.0000 mg | Freq: Once | INTRAVENOUS | Status: AC
  Administered 2017-02-26: 40 mg via INTRAVENOUS
  Filled 2017-02-26: qty 40

## 2017-02-26 MED ORDER — SODIUM CHLORIDE 0.9 % IV SOLN
Freq: Once | INTRAVENOUS | Status: AC
  Administered 2017-02-26: 12:00:00 via INTRAVENOUS

## 2017-02-26 MED ORDER — PANTOPRAZOLE SODIUM 40 MG IV SOLR
40.0000 mg | Freq: Two times a day (BID) | INTRAVENOUS | Status: DC
  Administered 2017-02-26: 40 mg via INTRAVENOUS
  Filled 2017-02-26: qty 40

## 2017-02-26 MED ORDER — CAPSAICIN 0.025 % EX CREA: TOPICAL_CREAM | Freq: Four times a day (QID) | CUTANEOUS | Status: DC | PRN

## 2017-02-26 MED ORDER — FAMOTIDINE IN NACL 20-0.9 MG/50ML-% IV SOLN
20.0000 mg | Freq: Once | INTRAVENOUS | Status: AC
Start: 2017-02-26 — End: 2017-02-26
  Administered 2017-02-26: 20 mg via INTRAVENOUS
  Filled 2017-02-26: qty 50

## 2017-02-26 MED ORDER — ACETAMINOPHEN 325 MG PO TABS
650.0000 mg | ORAL_TABLET | Freq: Four times a day (QID) | ORAL | Status: DC | PRN
  Administered 2017-02-26 (×2): 650 mg via ORAL
  Filled 2017-02-26 (×2): qty 2

## 2017-02-26 MED ORDER — ACETAMINOPHEN 650 MG RE SUPP: 650.0000 mg | Freq: Four times a day (QID) | RECTAL | Status: DC | PRN

## 2017-02-26 MED ORDER — SODIUM CHLORIDE 0.9% FLUSH
3.0000 mL | Freq: Two times a day (BID) | INTRAVENOUS | Status: DC
  Administered 2017-02-26: 3 mL via INTRAVENOUS

## 2017-02-26 MED ORDER — SODIUM CHLORIDE 0.9 % IV BOLUS (SEPSIS)
1000.0000 mL | Freq: Once | INTRAVENOUS | Status: AC
  Administered 2017-02-26: 1000 mL via INTRAVENOUS

## 2017-02-26 MED ORDER — SODIUM CHLORIDE 0.9 % IV SOLN: INTRAVENOUS | Status: DC

## 2017-02-26 NOTE — H&P (View-Only) (Signed)
Referring Provider:  EDP Primary Care Physician:  Fanny Bien, MD Primary Gastroenterologist:  Althia Forts  Reason for Consultation:  anemia, possible GI bleed  HPI: Brittany Cherry is a 46 y.o. female with past medical history of GERD presented to the hospital for further evaluation of abdominal pain and knee pain. Upon initial evaluation, patient was found to have a hemoglobin of 6.6. GI is consulted for further evaluation.   Patient seen and examined at bedside. She is complaining of worsening epigastric pain and burning sensation for last few months. She is also complaining of diarrhea alternating with constipation and has noted small amount of bright blood in the stool since last 5 days. She has also lost around 10 pounds in last 2 months. Opening of weakness and fatigue. Denied any melena. C/o  of nausea and vomiting but denied any blood in the vomiting. Denied dysphagia or odynophagia. Uses disease BC powder almost every day for last 1 year  No previous EGD or colonoscopy. No family history of colon cancer  Past Medical History:  Diagnosis Date  . AMA (advanced maternal age) multigravida 61+   . Depression   . GERD (gastroesophageal reflux disease)   . Hyperemesis gravidarum     Past Surgical History:  Procedure Laterality Date  . DILATION AND CURETTAGE OF UTERUS    . POST PARTUM TUBAL LIGATION Bilateral 08/18/2013   Performed by Florian Buff, MD at [facility]  . WISDOM TOOTH EXTRACTION      Prior to Admission medications   Medication Sig Start Date End Date Taking? Authorizing Provider  acetaminophen (TYLENOL) 325 MG tablet Take 650 mg every 6 (six) hours as needed by mouth for headache.   Yes [provider]  omeprazole (PRILOSEC) 10 MG capsule Take 10 mg by mouth daily.   Yes [provider]  OVER THE COUNTER MEDICATION Apply 1 application daily as needed topically (Back Pain). Arthritis Pain Cream   Yes [provider]   HYDROcodone-acetaminophen (NORCO/VICODIN) 5-325 MG tablet Take 1 tablet by mouth every 12 (twelve) hours as needed for moderate pain. Patient not taking: Reported on 02/26/2017 10/05/16   Meredith Pel, MD  ketorolac (TORADOL) 10 MG tablet Take 1 tablet (10 mg total) by mouth every 8 (eight) hours as needed. Patient not taking: Reported on 02/26/2017 07/01/16   Suzan Slick, NP  oxyCODONE (OXY IR/ROXICODONE) 5 MG immediate release tablet Take 1 tablet (5 mg total) by mouth every 6 (six) hours as needed for severe pain. Patient not taking: Reported on 02/26/2017 09/07/16   Meredith Pel, MD    Scheduled Meds: . pantoprazole (PROTONIX) IV  40 mg Intravenous Q12H   Continuous Infusions: . sodium chloride     PRN Meds:.  Allergies as of 02/26/2017  . (No Known Allergies)    No family history on file.  Social History   Socioeconomic History  . Marital status: Single    Spouse name: Not on file  . Number of children: Not on file  . Years of education: Not on file  . Highest education level: Not on file  Social Needs  . Financial resource strain: Not on file  . Food insecurity - worry: Not on file  . Food insecurity - inability: Not on file  . Transportation needs - medical: Not on file  . Transportation needs - non-medical: Not on file  Occupational History  . Not on file  Tobacco Use  . Smoking status: Current Every Day Smoker  .  Smokeless tobacco: Never Used  Substance and Sexual Activity  . Alcohol use: Yes  . Drug use: No  . Sexual activity: Yes  Other Topics Concern  . Not on file  Social History Narrative  . Not on file    Review of Systems: Review of Systems  Constitutional: Positive for malaise/fatigue and weight loss. Negative for chills and fever.  HENT: Negative for hearing loss and tinnitus.   Eyes: Negative for blurred vision and double vision.  Respiratory: Positive for shortness of breath. Negative for cough and hemoptysis.    Cardiovascular: Negative for chest pain and palpitations.  Gastrointestinal: Positive for abdominal pain, blood in stool, constipation, diarrhea, heartburn, nausea and vomiting. Negative for melena.  Genitourinary: Negative for dysuria and urgency.  Musculoskeletal: Positive for myalgias. Negative for neck pain.  Skin: Negative for itching and rash.  Neurological: Positive for weakness. Negative for focal weakness and seizures.  Endo/Heme/Allergies: Does not bruise/bleed easily.  Psychiatric/Behavioral: Negative for hallucinations and suicidal ideas.    Physical Exam: Vital signs: Vitals:   02/26/17 0830 02/26/17 1045  BP: 112/72 118/82  Pulse: (!) 59 66  Resp:  18  Temp:    SpO2: 100% 100%   Physical Exam  Constitutional: She is oriented to person, place, and time. She appears well-developed and well-nourished. No distress.  HENT:  Head: Normocephalic and atraumatic.  Mouth/Throat: Oropharynx is clear and moist. No oropharyngeal exudate.  Eyes: EOM are normal. No scleral icterus.  Neck: Normal range of motion. Neck supple. No thyromegaly present.  Cardiovascular: Normal rate, regular rhythm and normal heart sounds.  Pulmonary/Chest: Effort normal and breath sounds normal. No respiratory distress.  Abdominal: Soft. Bowel sounds are normal. She exhibits no distension. There is no tenderness. There is no rebound and no guarding.  Musculoskeletal: Normal range of motion. She exhibits no edema.  Neurological: She is alert and oriented to person, place, and time.  Skin: Skin is warm. No erythema.  Psychiatric: She has a normal mood and affect. Judgment and thought content normal.  Vitals reviewed.   GI:  Lab Results: Recent Labs    02/26/17 0749  WBC 6.6  HGB 6.6*  HCT 22.9*  PLT 278   BMET Recent Labs    02/26/17 0749  NA 136  K 3.8  CL 107  CO2 22  GLUCOSE 93  BUN 8  CREATININE 0.81  CALCIUM 8.7*   LFT Recent Labs    02/26/17 0749  PROT 6.7  ALBUMIN 3.5   AST 11*  ALT 12*  ALKPHOS 62  BILITOT 0.4   PT/INR No results for input(s): LABPROT, INR in the last 72 hours.   Studies/Results: No results found.  Impression/Plan: - Symptomatic anemia with hemoglobin of 6.6 on admission . C/O Small amount of red blood in the stool since last 5 days but occult blood is negative - Epigastric pain and burning - GERD - Significant NSAID use. - Diarrhea alternating with constipation - Weight loss. 10 pounds in last 2 months.  Recommendations --------------------------- - Patient would like to have inpatient workup done for anemia and recent episode of GI bleed. She will  need colonoscopy for further evaluation but given her epigastric pain and significant NSAID use, Recommend  EGD as well. Patient is agreeable. Risks benefits and alternatives discussed with the patient. She verbalized understanding - Clear liquid diet today. Nothing by mouth past midnight. - Continue IV twice a day PPI for now. - Avoid NSAIDs. - Further plan based on EGD and colonoscopy  findings.   LOS: 0 days   Otis Brace  MD, FACP 02/26/2017, 11:00 AM  Contact #  [phone]

## 2017-02-26 NOTE — ED Notes (Signed)
Hospitalist at bedside speaking with patient about history and admission.

## 2017-02-26 NOTE — ED Triage Notes (Signed)
Reports left sided neck pain for two days.  Reports that it feels like I slept wrong.  Took Robaxin with no relief.  Also reports epigastric pain for several months with decreased appetite and noticed blood in stool.  Reports taking bc powders for pain frequently.  Hx of anemia.

## 2017-02-26 NOTE — ED Provider Notes (Signed)
[facility] EMERGENCY DEPARTMENT Provider Note   CSN: [id number] Arrival date & time: 02/26/17  7829     History   Chief Complaint Chief Complaint  Patient presents with  . Abdominal Pain  . Neck Pain    HPI Brittany Cherry is a 46 y.o. female.  HPI   Brittany Cherry is a 46 y.o. female, with a history of GERD, presenting to the ED with left sided neck and back pain for the past few weeks. "Feels like a pulled muscle," 8/10, nonradiating. Has been using BC powder, robaxin, and ice.   Epigastric pain intermittent for past several months. Burning, 10/10, nonradiating.  Accompanied by hematochezia and feelings of bloating for the past month.  Hematochezia is mixed in with the stool.  A couple episodes of nonbloody, nonbilious vomiting this week. Had a small BM yesterday, previous BM to this was Nov 13. Constipation over the past month with 2 BM a week. Takes approximately 5 BC powders per week. Denies alcohol use for past several months, previously drank about 2 drinks a month.  LMP 02/16/17. Has no current PCP, but has insurance and just needs to find a new one.   Denies fever/chills, diaphoresis, melena, CP, SOB, falls/trauma, neuro deficits, rectal pain, or any other complaints.    Past Medical History:  Diagnosis Date  . AMA (advanced maternal age) multigravida 2+   . Depression   . GERD (gastroesophageal reflux disease)   . Hyperemesis gravidarum     Patient Active Problem List   Diagnosis Date Noted  . Acute anemia 02/26/2017  . Chronic pain of both knees 06/07/2016  . NSVD (normal spontaneous vaginal delivery) 08/20/2013  . S/P tubal ligation 08/20/2013  . PROM (premature rupture of membranes) 08/18/2013  . Elderly multigravida with antepartum condition or complication 56/21/3086    Past Surgical History:  Procedure Laterality Date  . DILATION AND CURETTAGE OF UTERUS    . POST PARTUM TUBAL LIGATION Bilateral 08/18/2013   Performed by  Florian Buff, MD at [facility] ORS  . WISDOM TOOTH EXTRACTION      OB History    Gravida Para Term Preterm AB Living   8 4 4   4 4    SAB TAB Ectopic Multiple Live Births   2 2     4        Home Medications    Prior to Admission medications   Medication Sig Start Date End Date Taking? Authorizing Provider  acetaminophen (TYLENOL) 325 MG tablet Take 650 mg every 6 (six) hours as needed by mouth for headache.   Yes [provider]  omeprazole (PRILOSEC) 10 MG capsule Take 10 mg by mouth daily.   Yes [provider]  OVER THE COUNTER MEDICATION Apply 1 application daily as needed topically (Back Pain). Arthritis Pain Cream   Yes [provider]  HYDROcodone-acetaminophen (NORCO/VICODIN) 5-325 MG tablet Take 1 tablet by mouth every 12 (twelve) hours as needed for moderate pain. Patient not taking: Reported on 02/26/2017 10/05/16   Meredith Pel, MD  ketorolac (TORADOL) 10 MG tablet Take 1 tablet (10 mg total) by mouth every 8 (eight) hours as needed. Patient not taking: Reported on 02/26/2017 07/01/16   Suzan Slick, NP  oxyCODONE (OXY IR/ROXICODONE) 5 MG immediate release tablet Take 1 tablet (5 mg total) by mouth every 6 (six) hours as needed for severe pain. Patient not taking: Reported on 02/26/2017 09/07/16   Meredith Pel, MD    Family History No  family history on file.  Social History Social History   Tobacco Use  . Smoking status: Current Every Day Smoker  . Smokeless tobacco: Never Used  Substance Use Topics  . Alcohol use: Yes  . Drug use: No     Allergies   Patient has no known allergies.   Review of Systems Review of Systems  Constitutional: Negative for chills, diaphoresis and fever.  Respiratory: Negative for shortness of breath.   Cardiovascular: Negative for chest pain.  Gastrointestinal: Positive for abdominal pain, blood in stool, constipation, nausea and vomiting (resolved).  Genitourinary: Negative for dysuria,  frequency, hematuria and vaginal discharge.  Musculoskeletal: Positive for back pain and neck pain.  Neurological: Negative for weakness and numbness.  All other systems reviewed and are negative.    Physical Exam Updated Vital Signs BP (!) 159/87 (BP Location: Right Arm)   Pulse 90   Temp 97.8 F (36.6 C) (Oral)   Resp 18   Ht 5\' 2"  (1.575 m)   Wt 86.2 kg (190 lb)   SpO2 100%   BMI 34.75 kg/m   Physical Exam  Constitutional: She appears well-developed and well-nourished. No distress.  HENT:  Head: Normocephalic and atraumatic.  Eyes: Conjunctivae are normal.  Neck: Neck supple.  Cardiovascular: Normal rate, regular rhythm, normal heart sounds and intact distal pulses.  Pulmonary/Chest: Effort normal and breath sounds normal. No respiratory distress.  Abdominal: Soft. Bowel sounds are increased. There is tenderness (minor to moderate) in the epigastric area. There is no guarding.  Genitourinary:  Genitourinary Comments: No hemorrhoids, fissures, or lesions noted. No gross blood or stool burden. No rectal tenderness. RN, Luellen Pucker, served as chaperone during the rectal exam.  Musculoskeletal: She exhibits tenderness. She exhibits no edema.  Tenderness to the left cervical and thoracic musculature.  No noted swelling, erythema, or other abnormalities. Normal motor function intact in all extremities and spine. No midline spinal tenderness.   Lymphadenopathy:    She has no cervical adenopathy.  Neurological: She is alert.  No sensory deficits.  No noted speech deficits. No aphasia. Patient handles oral secretions without difficulty. No noted swallowing defects.  Equal grip strength bilaterally. Strength 5/5 in the upper extremities. Strength 5/5 with flexion and extension of the hips, knees, and ankles bilaterally.  Negative Romberg. No gait disturbance.  Coordination intact including heel to shin and finger to nose.  Cranial nerves III-XII grossly intact.  No facial droop.     Skin: Skin is warm and dry. She is not diaphoretic.  Psychiatric: She has a normal mood and affect. Her behavior is normal.  Nursing note and vitals reviewed.    ED Treatments / Results  Labs (all labs ordered are listed, but only abnormal results are displayed) Labs Reviewed  COMPREHENSIVE METABOLIC PANEL - Abnormal; Notable for the following components:      Result Value   Calcium 8.7 (*)    AST 11 (*)    ALT 12 (*)    All other components within normal limits  CBC WITH DIFFERENTIAL/PLATELET - Abnormal; Notable for the following components:   Hemoglobin 6.6 (*)    HCT 22.9 (*)    MCV 53.5 (*)    MCH 15.4 (*)    MCHC 28.8 (*)    RDW 20.5 (*)    All other components within normal limits  LIPASE, BLOOD  URINALYSIS, ROUTINE W REFLEX MICROSCOPIC  CBC  CBC  I-STAT BETA HCG BLOOD, ED ([facility], [facility], [facility] ONLY)  POC OCCULT BLOOD, ED  TYPE  AND SCREEN  PREPARE RBC (CROSSMATCH)    EKG  EKG Interpretation None       Radiology No results found.  Procedures Angiocath insertion Date/Time: 02/26/2017 8:00 AM Performed by: Lorayne Bender, PA-C Authorized by: Lorayne Bender, PA-C  Consent: Verbal consent obtained. Risks and benefits: risks, benefits and alternatives were discussed Consent given by: patient Patient identity confirmed: verbally with patient and provided demographic data Local anesthesia used: no  Anesthesia: Local anesthesia used: no  Sedation: Patient sedated: no  Patient tolerance: Patient tolerated the procedure well with no immediate complications Comments: Ultrasound-guided 20-gauge IV placed in right AC.  Positive flash.  Advanced and flushed without pain, swelling, or other signs of infiltration.  [place] bedside Date/Time: 02/26/2017 8:00 AM Performed by: Lorayne Bender, PA-C Authorized by: Lorayne Bender, PA-C  Consent: Verbal consent obtained. Risks and benefits: risks, benefits and alternatives were discussed Consent given by: patient Patient identity  confirmed: verbally with patient and provided demographic data Local anesthesia used: no  Anesthesia: Local anesthesia used: no  Sedation: Patient sedated: no  Patient tolerance: Patient tolerated the procedure well with no immediate complications Comments: Vascular ultrasound utilized for Angiocath insertion.  .Critical Care Performed by: Lorayne Bender, PA-C Authorized by: Lorayne Bender, PA-C   Critical care provider statement:    Critical care time (minutes):  35   Critical care was necessary to treat or prevent imminent or life-threatening deterioration of the following conditions: Symptomatic anemia requiring transfusion.   Critical care was time spent personally by me on the following activities:  Vascular access procedures, examination of patient, development of treatment plan with patient or surrogate, ordering and performing treatments and interventions, ordering and review of laboratory studies, pulse oximetry and re-evaluation of patient's condition   I assumed direction of critical care for this patient from another provider in my specialty: no     (including critical care time)  Medications Ordered in ED Medications  0.9 %  sodium chloride infusion (not administered)  pantoprazole (PROTONIX) injection 40 mg (not administered)  pantoprazole (PROTONIX) injection 40 mg (40 mg Intravenous Given 02/26/17 0849)  famotidine (PEPCID) IVPB 20 mg premix (20 mg Intravenous New Bag/Given 02/26/17 0849)  sodium chloride 0.9 % bolus 1,000 mL (1,000 mLs Intravenous New Bag/Given 02/26/17 0849)     Initial Impression / Assessment and Plan / ED Course  I have reviewed the triage vital signs and the nursing notes.  Pertinent labs & imaging results that were available during my care of the patient were reviewed by me and considered in my medical decision making (see chart for details).  Clinical Course as of Feb 27 1047  Sun Feb 26, 2017  0840 Patient states she has had anemia in the  past.  She has a distant history of blood transfusion.  She takes no iron supplements.  She denies chest pain, shortness of breath, dizziness, or other related symptoms. Hemoglobin: (!!) 6.6 [SJ]  [id number] Spoke with patient about lab results and transfusion. Pain is current resolved.  She now states, "I've been thinking about it since you asked about shortness of breath and I realize I have felt short of breath at work."  [SJ]  1004 Spoke with Dr. Henriette Combs, IM resident. Agrees to see and admit the patient.  [SJ]  [place] with Dr. Alessandra Bevels, [facility].  Agrees to consult on the patient.  Likely EGD tomorrow.  [SJ]    Clinical Course User Index [SJ] Shianne Zeiser C, PA-C  Patient presents with epigastric pain and reported hematochezia.  Hemoglobin 6.6.  Symptomatic with shortness of breath over the past couple weeks.  Suspect source may be upper GI bleed.  Patient otherwise hemodynamically stable.  Blood transfusion and admission.   Findings and plan of care discussed with Sherwood Gambler, MD. Dr. Regenia Skeeter personally evaluated and examined this patient.  Vitals:   02/26/17 0622 02/26/17 0625 02/26/17 0626  BP: (!) 159/87    Pulse: 90    Resp: 18    Temp: 97.7 F (36.5 C) 97.8 F (36.6 C)   TempSrc: Oral Oral   SpO2: 100%    Weight:   86.2 kg (190 lb)  Height:   5\' 2"  (1.575 m)   Vitals:   02/26/17 0715 02/26/17 0730 02/26/17 0815 02/26/17 0830  BP: (!) 117/51 (!) 104/53 119/68 112/72  Pulse: 71 76 71 (!) 59  Resp:      Temp:      TempSrc:      SpO2: 100% 100% 100% 100%  Weight:      Height:         Final Clinical Impressions(s) / ED Diagnoses   Final diagnoses:  Anemia, unspecified type  Epigastric pain    ED Discharge Orders    None       Layla Maw 02/26/17 1048    Lorayne Bender, PA-C 02/26/17 [place], [place], MD 02/26/17 1757

## 2017-02-26 NOTE — H&P (Signed)
Date: 02/26/2017               Patient Name:  Brittany Cherry MRN: [id number]  DOB: [birth year] Age / Sex: 46 y.o., female   PCP: Fanny Bien, MD         Medical Service: Internal Medicine Teaching Service         Attending Physician: Dr. Sherwood Gambler, MD    First Contact: Dr. Johny Chess Pager: [phone]  Second Contact: Dr. Tiburcio Pea Pager: [phone]       After Hours (After 5p/  First Contact Pager: [phone]  weekends / holidays): Second Contact Pager: [phone]   Chief Complaint: Epigastric pain, back pain   History of Present Illness: Ms. Boucher is a 46 year old female with past medical history of GERD, chronic left knee pain status post meniscectomy repair, anemia, depression who presented to the ED with complaints of epigastric pain, back pain.  She reports a roughly 3-month history of intermittent burning epigastric pain as well as nausea, bloating, loss of appetite which over the last 7-10 days has worsened with several episodes of vomiting (yellow, no blood), ~10 pound weight loss, and an episode of blood in her stool.  She reports seeing bright red blood mixed in with the stool and was Hemoccult positive when she tested herself, no recurrent episodes since that time.  She has a history of GERD and is taking Prilosec regularly which she states does relieve the pain.  She also takes BC powder for pain relief several times per week, she was previously taking these several times per day 8-12 months ago.  She feels her pain may increase after meals and at night but has not noticed a definitive pattern.  At baseline she has issues with constipation and bloating.  She averages 2 bowel movements per week which relieves her bloating and other abdominal discomfort.  Her last bowel movement was yesterday after taking the suppository.  She reports a history of anemia and has been prescribed iron pills in the past but has not taken these in the last year or so due to GI side effects.   She notes that she chews ice regularly at home and has had some shortness of breath with exertion at work.  In the ED, afebrile, heart rate 65, BP 109/65, 100% on room air.  Her hemoglobin was found to be 6.6.  Fecal occult blood was negative, rectal exam noted no gross blood, hemorrhoids, or fissure, and labs were otherwise unremarkable.  She received 1 L fluid bolus, IV PPI and famotidine, type and screen performed and consent obtained for blood transfusion.  GI was consulted and she was admitted for further management.  Meds:  No outpatient medications have been marked as taking for the 02/26/17 encounter [facility] Encounter).     Allergies: Allergies as of 02/26/2017  . (No Known Allergies)   Past Medical History:  Diagnosis Date  . AMA (advanced maternal age) multigravida 35+   . Depression   . GERD (gastroesophageal reflux disease)   . Hyperemesis gravidarum     Family History: Mother: HTN, arrhythmia  Sister: PE Grandfather: Leukemia Grandmother: Breast cancer   Social History:  Social History   Tobacco Use  . Smoking status: Current Every Day Smoker  . Smokeless tobacco: Never Used  Substance Use Topics  . Alcohol use: Yes  . Drug use: No  Works as a Marine scientist    Review of Systems: A complete ROS was negative except as per HPI.  Physical Exam: Blood pressure 112/72, pulse (!) 59, temperature 97.8 F (36.6 C), temperature source Oral, resp. rate 18, height 5\' 2"  (1.575 m), weight 190 lb (86.2 kg), SpO2 100 %. General: Resting in bed comfortably , no acute distress Head, Eyes: Pale conjuctiva, no scleral icterus, PERRL ENT: Moist mucous membranes, no exudate CV: RRR, s1, s2, no murmur appreciated Resp: Lungs clear to auscultation bilaterally, normal work of breathing, no distress  Abd: Soft, +BS, Mild tenderness to palpation throughout worse in epigastric region, no guarding, no rebound tenderness repeat rectal exam deferred  MSK: Mild TTP to L paraspinal  musculature and trapezius, no bony point tenderness or step-offs,  Extr: No LE edema  Neuro: Alert and oriented x3  Skin: Warm, dry      EKG: personally reviewed my interpretation is: pending at time of evaluation    Assessment & Plan by Problem:  Suspected Upper GI Bleed Patient presents with a history of various GI symptoms and an episode of bloody stool with hemoglobin found to be 6.6.  Given her history of GERD, BC powder use the most likely etiology is felt to be an upper GI source related to PUD.  GI was consulted for consideration for further endoscopic evaluation, appreciate recommendations and assistance.  --Transfuse 2 U pRBCs (transfuse <7)  --CBC q6hrs --IV Pantoprazole BID --Clear liquid diet, NPO @ midnight for potential procedure --F/u further GI recommendations  --Phenergan 12.5 mg q6hr prn  --Monitor vital signs      H/o Anemia Acute blood loss anemia is likely compounded by her history of chronic anemia, likely iron deficiency.  Last hemoglobin based on available records was 9.7 with low MCV in 2017 and 10-12 prior to that.  She had been prescribed oral iron but did not tolerate due to side effects. MCV of 53 on initial labs  --Ferritin  H/o Depression Patient has a history of depression previously treated with Lexapro which she self discontinued 8 months ago due to insurance reasons.  She states her mood is currently decreased due to her relationship issues and caring for her 7-year-old son with autism.  She would like to reestablish care and address this on follow-up.  She states she feels safe at home.  Back Pain Likely musculoskeletal in nature with no red flags.  Will treat conservatively with as needed Tylenol and continue to monitor   Dispo: Admit patient to Observation with expected length of stay less than 2 midnights.  Signed: Tawny Asal, MD 02/26/2017, 10:17 AM  Pager: [phone]

## 2017-02-26 NOTE — ED Triage Notes (Signed)
Paged ADM MD x 2 for report critical lab

## 2017-02-26 NOTE — Consult Note (Signed)
Referring Provider:  EDP Primary Care Physician:  Fanny Bien, MD Primary Gastroenterologist:  Althia Forts  Reason for Consultation:  anemia, possible GI bleed  HPI: Brittany Cherry is a 46 y.o. female with past medical history of GERD presented to the hospital for further evaluation of abdominal pain and knee pain. Upon initial evaluation, patient was found to have a hemoglobin of 6.6. GI is consulted for further evaluation.   Patient seen and examined at bedside. She is complaining of worsening epigastric pain and burning sensation for last few months. She is also complaining of diarrhea alternating with constipation and has noted small amount of bright blood in the stool since last 5 days. She has also lost around 10 pounds in last 2 months. Opening of weakness and fatigue. Denied any melena. C/o  of nausea and vomiting but denied any blood in the vomiting. Denied dysphagia or odynophagia. Uses disease BC powder almost every day for last 1 year  No previous EGD or colonoscopy. No family history of colon cancer  Past Medical History:  Diagnosis Date  . AMA (advanced maternal age) multigravida 25+   . Depression   . GERD (gastroesophageal reflux disease)   . Hyperemesis gravidarum     Past Surgical History:  Procedure Laterality Date  . DILATION AND CURETTAGE OF UTERUS    . POST PARTUM TUBAL LIGATION Bilateral 08/18/2013   Performed by Florian Buff, MD at [facility] ORS  . WISDOM TOOTH EXTRACTION      Prior to Admission medications   Medication Sig Start Date End Date Taking? Authorizing Provider  acetaminophen (TYLENOL) 325 MG tablet Take 650 mg every 6 (six) hours as needed by mouth for headache.   Yes [provider]  omeprazole (PRILOSEC) 10 MG capsule Take 10 mg by mouth daily.   Yes [provider]  OVER THE COUNTER MEDICATION Apply 1 application daily as needed topically (Back Pain). Arthritis Pain Cream   Yes [provider]   HYDROcodone-acetaminophen (NORCO/VICODIN) 5-325 MG tablet Take 1 tablet by mouth every 12 (twelve) hours as needed for moderate pain. Patient not taking: Reported on 02/26/2017 10/05/16   Meredith Pel, MD  ketorolac (TORADOL) 10 MG tablet Take 1 tablet (10 mg total) by mouth every 8 (eight) hours as needed. Patient not taking: Reported on 02/26/2017 07/01/16   Suzan Slick, NP  oxyCODONE (OXY IR/ROXICODONE) 5 MG immediate release tablet Take 1 tablet (5 mg total) by mouth every 6 (six) hours as needed for severe pain. Patient not taking: Reported on 02/26/2017 09/07/16   Meredith Pel, MD    Scheduled Meds: . pantoprazole (PROTONIX) IV  40 mg Intravenous Q12H   Continuous Infusions: . sodium chloride     PRN Meds:.  Allergies as of 02/26/2017  . (No Known Allergies)    No family history on file.  Social History   Socioeconomic History  . Marital status: Single    Spouse name: Not on file  . Number of children: Not on file  . Years of education: Not on file  . Highest education level: Not on file  Social Needs  . Financial resource strain: Not on file  . Food insecurity - worry: Not on file  . Food insecurity - inability: Not on file  . Transportation needs - medical: Not on file  . Transportation needs - non-medical: Not on file  Occupational History  . Not on file  Tobacco Use  . Smoking status: Current Every Day Smoker  .  Smokeless tobacco: Never Used  Substance and Sexual Activity  . Alcohol use: Yes  . Drug use: No  . Sexual activity: Yes  Other Topics Concern  . Not on file  Social History Narrative  . Not on file    Review of Systems: Review of Systems  Constitutional: Positive for malaise/fatigue and weight loss. Negative for chills and fever.  HENT: Negative for hearing loss and tinnitus.   Eyes: Negative for blurred vision and double vision.  Respiratory: Positive for shortness of breath. Negative for cough and hemoptysis.    Cardiovascular: Negative for chest pain and palpitations.  Gastrointestinal: Positive for abdominal pain, blood in stool, constipation, diarrhea, heartburn, nausea and vomiting. Negative for melena.  Genitourinary: Negative for dysuria and urgency.  Musculoskeletal: Positive for myalgias. Negative for neck pain.  Skin: Negative for itching and rash.  Neurological: Positive for weakness. Negative for focal weakness and seizures.  Endo/Heme/Allergies: Does not bruise/bleed easily.  Psychiatric/Behavioral: Negative for hallucinations and suicidal ideas.    Physical Exam: Vital signs: Vitals:   02/26/17 0830 02/26/17 1045  BP: 112/72 118/82  Pulse: (!) 59 66  Resp:  18  Temp:    SpO2: 100% 100%   Physical Exam  Constitutional: She is oriented to person, place, and time. She appears well-developed and well-nourished. No distress.  HENT:  Head: Normocephalic and atraumatic.  Mouth/Throat: Oropharynx is clear and moist. No oropharyngeal exudate.  Eyes: EOM are normal. No scleral icterus.  Neck: Normal range of motion. Neck supple. No thyromegaly present.  Cardiovascular: Normal rate, regular rhythm and normal heart sounds.  Pulmonary/Chest: Effort normal and breath sounds normal. No respiratory distress.  Abdominal: Soft. Bowel sounds are normal. She exhibits no distension. There is no tenderness. There is no rebound and no guarding.  Musculoskeletal: Normal range of motion. She exhibits no edema.  Neurological: She is alert and oriented to person, place, and time.  Skin: Skin is warm. No erythema.  Psychiatric: She has a normal mood and affect. Judgment and thought content normal.  Vitals reviewed.   GI:  Lab Results: Recent Labs    02/26/17 0749  WBC 6.6  HGB 6.6*  HCT 22.9*  PLT 278   BMET Recent Labs    02/26/17 0749  NA 136  K 3.8  CL 107  CO2 22  GLUCOSE 93  BUN 8  CREATININE 0.81  CALCIUM 8.7*   LFT Recent Labs    02/26/17 0749  PROT 6.7  ALBUMIN 3.5   AST 11*  ALT 12*  ALKPHOS 62  BILITOT 0.4   PT/INR No results for input(s): LABPROT, INR in the last 72 hours.   Studies/Results: No results found.  Impression/Plan: - Symptomatic anemia with hemoglobin of 6.6 on admission . C/O Small amount of red blood in the stool since last 5 days but occult blood is negative - Epigastric pain and burning - GERD - Significant NSAID use. - Diarrhea alternating with constipation - Weight loss. 10 pounds in last 2 months.  Recommendations --------------------------- - Patient would like to have inpatient workup done for anemia and recent episode of GI bleed. She will  need colonoscopy for further evaluation but given her epigastric pain and significant NSAID use, Recommend  EGD as well. Patient is agreeable. Risks benefits and alternatives discussed with the patient. She verbalized understanding - Clear liquid diet today. Nothing by mouth past midnight. - Continue IV twice a day PPI for now. - Avoid NSAIDs. - Further plan based on EGD and colonoscopy  findings.   LOS: 0 days   Otis Brace  MD, FACP 02/26/2017, 11:00 AM  Contact #  [phone]

## 2017-02-26 NOTE — Discharge Instructions (Signed)
No NSAIDS: BC powders, ibuprofen, naproxen

## 2017-02-27 ENCOUNTER — Observation Stay (HOSPITAL_COMMUNITY): Payer: PRIVATE HEALTH INSURANCE | Admitting: Certified Registered"

## 2017-02-27 ENCOUNTER — Encounter (HOSPITAL_COMMUNITY): Payer: Self-pay | Admitting: Certified Registered"

## 2017-02-27 ENCOUNTER — Encounter (HOSPITAL_COMMUNITY)
Admission: EM | Disposition: A | Discharge status: Home / Self Care | Payer: Self-pay | Source: Home / Self Care | Attending: Emergency Medicine

## 2017-02-27 HISTORY — PX: COLONOSCOPY WITH PROPOFOL: SHX5780

## 2017-02-27 HISTORY — PX: ESOPHAGOGASTRODUODENOSCOPY (EGD) WITH PROPOFOL: SHX5813

## 2017-02-27 LAB — BPAM RBC
BLOOD PRODUCT EXPIRATION DATE: 201812062359
Blood Product Expiration Date: 201812022359
ISSUE DATE / TIME: 201811181505
ISSUE DATE / TIME: 201811181204
Unit Type and Rh: 5100
Unit Type and Rh: 5100

## 2017-02-27 LAB — CBC
HEMATOCRIT: 27 % — AB (ref 36.0–46.0)
HEMATOCRIT: 28.2 % — AB (ref 36.0–46.0)
HEMOGLOBIN: 8.3 g/dL — AB (ref 12.0–15.0)
HEMOGLOBIN: 8.5 g/dL — AB (ref 12.0–15.0)
MCH: 18 pg — ABNORMAL LOW (ref 26.0–34.0)
MCH: 17.7 pg — AB (ref 26.0–34.0)
MCHC: 30.7 g/dL (ref 30.0–36.0)
MCHC: 30.1 g/dL (ref 30.0–36.0)
MCV: 58.4 fL — AB (ref 78.0–100.0)
MCV: 58.6 fL — AB (ref 78.0–100.0)
PLATELETS: 222 10*3/uL (ref 150–400)
Platelets: 223 10*3/uL (ref 150–400)
RBC: 4.62 MIL/uL (ref 3.87–5.11)
RBC: 4.81 MIL/uL (ref 3.87–5.11)
RDW: 26.1 % — ABNORMAL HIGH (ref 11.5–15.5)
RDW: 26.3 % — ABNORMAL HIGH (ref 11.5–15.5)
WBC: 8.4 10*3/uL (ref 4.0–10.5)
WBC: 7.5 10*3/uL (ref 4.0–10.5)

## 2017-02-27 LAB — COMPREHENSIVE METABOLIC PANEL
ALBUMIN: 3.3 g/dL — AB (ref 3.5–5.0)
ALT: 13 U/L — ABNORMAL LOW (ref 14–54)
ANION GAP: 6 (ref 5–15)
AST: 13 U/L — AB (ref 15–41)
Alkaline Phosphatase: 58 U/L (ref 38–126)
BUN: 7 mg/dL (ref 6–20)
CO2: 22 mmol/L (ref 22–32)
Calcium: 8.4 mg/dL — ABNORMAL LOW (ref 8.9–10.3)
Chloride: 108 mmol/L (ref 101–111)
Creatinine, Ser: 0.77 mg/dL (ref 0.44–1.00)
GFR calc Af Amer: >60 mL/min (ref >60)
GFR calc non Af Amer: >60 mL/min (ref >60)
GLUCOSE: 70 mg/dL (ref 65–99)
POTASSIUM: 3.9 mmol/L (ref 3.5–5.1)
SODIUM: 136 mmol/L (ref 135–145)
Total Bilirubin: 0.6 mg/dL (ref 0.3–1.2)
Total Protein: 5.6 g/dL — ABNORMAL LOW (ref 6.5–8.1)

## 2017-02-27 LAB — TYPE AND SCREEN
ABO/RH(D): O POS
Antibody Screen: NEGATIVE
UNIT DIVISION: 0
Unit division: 0

## 2017-02-27 LAB — GLUCOSE, CAPILLARY: Glucose-Capillary: 85 mg/dL (ref 65–99)

## 2017-02-27 LAB — HIV ANTIBODY (ROUTINE TESTING W REFLEX): HIV Screen 4th Generation wRfx: NONREACTIVE

## 2017-02-27 SURGERY — COLONOSCOPY WITH PROPOFOL
Anesthesia: Monitor Anesthesia Care

## 2017-02-27 MED ORDER — LIDOCAINE 2% (20 MG/ML) 5 ML SYRINGE
INTRAMUSCULAR | Status: DC | PRN
  Administered 2017-02-27: 40 mg via INTRAVENOUS

## 2017-02-27 MED ORDER — PROPOFOL 500 MG/50ML IV EMUL
INTRAVENOUS | Status: DC | PRN
  Administered 2017-02-27: 125 ug/kg/min via INTRAVENOUS

## 2017-02-27 MED ORDER — LACTATED RINGERS IV SOLN
INTRAVENOUS | Status: DC
  Administered 2017-02-27: 10:00:00 via INTRAVENOUS

## 2017-02-27 MED ORDER — PROPOFOL 10 MG/ML IV BOLUS
INTRAVENOUS | Status: DC | PRN
  Administered 2017-02-27: 20 mg via INTRAVENOUS
  Administered 2017-02-27: 10 mg via INTRAVENOUS
  Administered 2017-02-27 (×3): 20 mg via INTRAVENOUS
  Administered 2017-02-27: 30 mg via INTRAVENOUS
  Administered 2017-02-27: 10 mg via INTRAVENOUS
  Administered 2017-02-27: 20 mg via INTRAVENOUS
  Administered 2017-02-27: 10 mg via INTRAVENOUS
  Administered 2017-02-27: 20 mg via INTRAVENOUS

## 2017-02-27 MED ORDER — FERROUS GLUCONATE 324 (38 FE) MG PO TABS: 324.0000 mg | ORAL_TABLET | Freq: Every day | ORAL | 3 refills | Status: DC

## 2017-02-27 MED ORDER — SODIUM CHLORIDE 0.9 % IV SOLN
510.0000 mg | Freq: Once | INTRAVENOUS | Status: AC
  Administered 2017-02-27: 510 mg via INTRAVENOUS
  Filled 2017-02-27: qty 17

## 2017-02-27 NOTE — Op Note (Signed)
EGD showed a normal esophagus, irregular Z line at 35 cm, mild antral erythema status post biopsy for H. Pylori, normal-appearing duodenal bulb and duodenum status post biopsy for celiac disease.   Colonoscopy showed extensive amount of semiliquid and semisolid stool in the entire colon interfering with visualization despite copious amount of lavage. 1 semi-pedunculated sigmoid polyp was removed with hot snare. Retroflexion was normal.  Recommend resume regular diet. Colonoscopy in 3 months as an outpatient with a better prep. Pathology may be followed as an outpatient. OK to d/c patient, to f/u with me in office in 2-3 weeks.  Ronnette Juniper, MD

## 2017-02-27 NOTE — Interval H&P Note (Signed)
History and Physical Interval Note: 46/female for EGD and colonoscopy for severe iron deficiency anemia,epigastric pain, change in bowel habits, bright red blood in stool., weight loss of 10 lbs in 2 months. 02/27/2017 9:31 AM  Brittany Cherry  has presented today for EGD and colonoscopy with the diagnosis of Symptomatic anemia,  The various methods of treatment have been discussed with the patient and family. After consideration of risks, benefits and other options for treatment, the patient has consented to  Procedure(s): COLONOSCOPY WITH PROPOFOL (N/A) ESOPHAGOGASTRODUODENOSCOPY (EGD) WITH PROPOFOL (N/A) as a surgical intervention .  The patient's history has been reviewed, patient examined, no change in status, stable for surgery.  I have reviewed the patient's chart and labs.  Questions were answered to the patient's satisfaction.     Brittany Cherry

## 2017-02-27 NOTE — Anesthesia Postprocedure Evaluation (Signed)
Anesthesia Post Note  Patient: Brittany Cherry  Procedure(s) Performed: COLONOSCOPY WITH PROPOFOL (N/A ) ESOPHAGOGASTRODUODENOSCOPY (EGD) WITH PROPOFOL (N/A )     Patient location during evaluation: PACU Anesthesia Type: MAC Level of consciousness: awake and alert Pain management: pain level controlled Vital Signs Assessment: post-procedure vital signs reviewed and stable Respiratory status: spontaneous breathing, nonlabored ventilation and respiratory function stable Cardiovascular status: stable and blood pressure returned to baseline Anesthetic complications: no    Last Vitals:  Vitals:   02/27/17 1030 02/27/17 1040  BP:  111/61  Pulse: 70 63  Resp: 18 16  Temp:    SpO2: 100% 100%    Last Pain:  Vitals:   02/27/17 1028  TempSrc: Oral  PainSc:                  Audry Pili

## 2017-02-27 NOTE — Anesthesia Preprocedure Evaluation (Addendum)
Anesthesia Evaluation  Patient identified by MRN, date of birth, ID band Patient awake    Reviewed: Allergy & Precautions, H&P , NPO status , Patient's Chart, lab work & pertinent test results  Airway Mallampati: II  TM Distance: >3 FB Neck ROM: Full    Dental  (+) Dental Advisory Given, Teeth Intact   Pulmonary Current Smoker,    breath sounds clear to auscultation       Cardiovascular negative cardio ROS   Rhythm:Regular Rate:Normal     Neuro/Psych Depression negative neurological ROS     GI/Hepatic Neg liver ROS, GERD  ,  Endo/Other  Obesity  Renal/GU negative Renal ROS  negative genitourinary   Musculoskeletal negative musculoskeletal ROS (+)   Abdominal   Peds  Hematology  (+) anemia ,   Anesthesia Other Findings   Reproductive/Obstetrics                            Anesthesia Physical  Anesthesia Plan  ASA: II  Anesthesia Plan: MAC   Post-op Pain Management:    Induction: Intravenous  PONV Risk Score and Plan: Propofol infusion and Treatment may vary due to age or medical condition  Airway Management Planned: Nasal Cannula  Additional Equipment: None  Intra-op Plan:   Post-operative Plan:   Informed Consent: I have reviewed the patients History and Physical, chart, labs and discussed the procedure including the risks, benefits and alternatives for the proposed anesthesia with the patient or authorized representative who has indicated his/her understanding and acceptance.   Dental advisory given  Plan Discussed with: CRNA  Anesthesia Plan Comments:         Anesthesia Quick Evaluation

## 2017-02-27 NOTE — Progress Notes (Signed)
Pt asked if she can take a shower in a little while. I advised pt will contact MD for shower order.

## 2017-02-27 NOTE — Progress Notes (Signed)
Brain Hilts to be D/C'd Home per MD order.  Discussed with the patient and all questions fully answered.  VSS, Skin clean, dry and intact without evidence of skin break down, no evidence of skin tears noted. IV catheter discontinued intact. Site without signs and symptoms of complications. Dressing and pressure applied.  An After Visit Summary was printed and given to the patient. Patient received prescription.  Allergies as of 02/27/2017   No Known Allergies     Medication List    STOP taking these medications   HYDROcodone-acetaminophen 5-325 MG tablet Commonly known as:  NORCO/VICODIN   ketorolac 10 MG tablet Commonly known as:  TORADOL   omeprazole 10 MG capsule Commonly known as:  PRILOSEC   OVER THE COUNTER MEDICATION   oxyCODONE 5 MG immediate release tablet Commonly known as:  Oxy IR/ROXICODONE     TAKE these medications   acetaminophen 325 MG tablet Commonly known as:  TYLENOL Take 650 mg every 6 (six) hours as needed by mouth for headache.   ferrous gluconate 324 MG tablet Commonly known as:  FERGON Take 1 tablet (324 mg total) daily with breakfast by mouth.      D/c education completed with patient/family including follow up instructions, medication list, d/c activities limitations if indicated, with other d/c instructions as indicated by MD - patient able to verbalize understanding, all questions fully answered.   Patient instructed to return to ED, call 911, or call MD for any changes in condition.   Patient escorted via [place], and D/C home via private auto.  Dreama Saa 02/27/2017 4:00 PM

## 2017-02-27 NOTE — Progress Notes (Signed)
   Subjective: No acute events overnight, hemoglobin remained stable following transfusion.  She was n.p.o. at midnight and was seen following her endoscopy which she tolerated well.  Notes some soreness in her abdomen but states she is feeling much better compared to her presentation and is eager to go home  Objective:  Vital signs in last 24 hours: Vitals:   02/27/17 1028 02/27/17 1030 02/27/17 1040 02/27/17 1050  BP: (!) 115/53  111/61 132/69  Pulse: 65 70 63 61  Resp: 17 18 16 19   Temp: 98.4 F (36.9 C)     TempSrc: Oral     SpO2: 100% 100% 100% 100%  Weight:      Height:       General: Resting in bed comfortably, no acute distress CV: Regular rate and rhythm, no murmur  Resp: Clear breath sounds bilaterally, normal work of breathing, no distress  Abd: Soft, +BS, mild tenderness to epigastric region, no rebound, no guarding  Extr: No LE edema Neuro: Alert and oriented x3  Skin: Warm, dry      Assessment/Plan:  Acute Blood Loss Anemia, Suspected GI Source  Patient presented with a history of various GI symptoms and episode of bloody stool with hemoglobin found to be 6.6.  She had a history of GERD along with regular BC powder/NSAID use.  GI was consulted and performed EGD and colonoscopy.  Colonoscopy showed a sigmoid polyp which was removed and sent for pathology, however the prep was poor.  EGD showed mild erythema and biopsies were collected for H. pylori, celiac disease. --Regular diet --F/u GI in 2-3 weeks, repeat colonoscopy in 3 months  --F/u pathology results   Chronic Iron Deficiency Anemia Patient has a history of chronic anemia, likely iron deficiency.  She reports heavy menstrual periods since the birth of her 31-year-old son and is likely perimenopausal.  MCV was 53, ferritin 3 consistent with iron deficiency which likely exacerbated any acute blood loss described above. --IV Ferraheme  --PO Fe gluconate as outpt to improve tolerance    H/o Depression Patient  has a history of depression previously treated with Lexapro which she self discontinued 8 months ago due to insurance reasons.  Upon admission she reported her mood was decreased due to relationship issues and caring for her 108-year-old son with autism. Denied SI/HI.  She has reestablished care with a PCP and would like to address this as an outpatient.   Dispo: Anticipated discharge in approximately 0-1 day(s).   Tawny Asal, MD 02/27/2017, 2:13 PM Pager: [phone]

## 2017-02-27 NOTE — Transfer of Care (Signed)
Immediate Anesthesia Transfer of Care Note  Patient: Brittany Cherry  Procedure(s) Performed: COLONOSCOPY WITH PROPOFOL (N/A ) ESOPHAGOGASTRODUODENOSCOPY (EGD) WITH PROPOFOL (N/A )  Patient Location: Endoscopy Unit  Anesthesia Type:MAC  Level of Consciousness: awake  Airway & Oxygen Therapy: Patient Spontanous Breathing  Post-op Assessment: Report given to RN  Post vital signs: Reviewed and stable  Last Vitals:  Vitals:   02/27/17 0906 02/27/17 0907  BP:  (!) 141/68  Pulse: (!) 57   Resp: 20   Temp: 36.8 C   SpO2: 100%     Last Pain:  Vitals:   02/27/17 0907  TempSrc: Oral  PainSc:       Patients Stated Pain Goal: 3 (15/05/69 7948)  Complications: No apparent anesthesia complications

## 2017-02-27 NOTE — Op Note (Signed)
[facility] Patient Name: Brittany Cherry Procedure Date : 02/27/2017 MRN: [id number] Attending MD: Ronnette Juniper , MD Date of Birth: [birth year] CSN: [id number] Age: 46 Admit Type: Inpatient Procedure:                Upper GI endoscopy Indications:              Epigastric abdominal pain, Unexplained iron                            deficiency anemia, Weight loss Providers:                Ronnette Juniper, MD, Cleda Daub, RN, William Dalton,                            Technician Referring MD:              Medicines:                Monitored Anesthesia Care Complications:            No immediate complications. Estimated Blood Loss:     Estimated blood loss: none. Procedure:                Pre-Anesthesia Assessment:                           - Prior to the procedure, a History and Physical                            was performed, and patient medications and                            allergies were reviewed. The patient's tolerance of                            previous anesthesia was also reviewed. The risks                            and benefits of the procedure and the sedation                            options and risks were discussed with the patient.                            All questions were answered, and informed consent                            was obtained. Prior Anticoagulants: The patient has                            taken no previous anticoagulant or antiplatelet                            agents. ASA Grade Assessment: II - A patient with  mild systemic disease. After reviewing the risks                            and benefits, the patient was deemed in                            satisfactory condition to undergo the procedure.                           After obtaining informed consent, the endoscope was                            passed under direct vision. Throughout the                            procedure, the patient's  blood pressure, pulse, and                            oxygen saturations were monitored continuously. The                            EG-[id number] ([id number]) scope was introduced through the                            mouth, and advanced to the second part of duodenum.                            The upper GI endoscopy was accomplished without                            difficulty. The patient tolerated the procedure                            well. Scope In: Scope Out: Findings:      The examined esophagus was normal.      The Z-line was regular and was found 35 cm from the incisors.      Localized mildly erythematous mucosa without bleeding was found in the       gastric antrum. Biopsies were taken with a cold forceps for Helicobacter       pylori testing.      The cardia and gastric fundus were normal on retroflexion.      The examined duodenum was normal. Biopsies for histology were taken with       a cold forceps for evaluation of celiac disease. Impression:               - Normal esophagus.                           - Z-line regular, 35 cm from the incisors.                           - Erythematous mucosa in the antrum. Biopsied.                           -  Normal examined duodenum. Biopsied. Moderate Sedation:      Patient did not receive moderate sedation for this procedure, but       instead received monitored anesthesia care. Recommendation:           - Resume regular diet.                           - Continue present medications.                           - Await pathology results.                           - Return patient to hospital ward for ongoing care. Procedure Code(s):        --- Professional ---                           [phone], Esophagogastroduodenoscopy, flexible,                            transoral; with biopsy, single or multiple Diagnosis Code(s):        --- Professional ---                           K31.89, Other diseases of stomach and duodenum                            R10.13, Epigastric pain                           D50.9, Iron deficiency anemia, unspecified                           R63.4, Abnormal weight loss CPT copyright 2016 American Medical [employer]. All rights reserved. The codes documented in this report are preliminary and upon coder review may  be revised to meet current compliance requirements. Ronnette Juniper, MD 02/27/2017 10:27:44 AM This report has been signed electronically. Number of Addenda: 0

## 2017-02-27 NOTE — Op Note (Signed)
[facility] Patient Name: Brittany Cherry Procedure Date : 02/27/2017 MRN: [id number] Attending MD: Ronnette Juniper , MD Date of Birth: 11/21/1970 CSN: [id number] Age: 46 Admit Type: Inpatient Procedure:                Colonoscopy Indications:              This is the patient's first colonoscopy,                            Unexplained iron deficiency anemia, Weight loss,                            Incidental change in bowel habits noted Providers:                Ronnette Juniper, MD, Cleda Daub, RN, William Dalton,                            Technician Referring MD:              Medicines:                Monitored Anesthesia Care Complications:            No immediate complications. Estimated Blood Loss:     Estimated blood loss: none. Procedure:                Pre-Anesthesia Assessment:                           - Prior to the procedure, a History and Physical                            was performed, and patient medications and                            allergies were reviewed. The patient's tolerance of                            previous anesthesia was also reviewed. The risks                            and benefits of the procedure and the sedation                            options and risks were discussed with the patient.                            All questions were answered, and informed consent                            was obtained. Prior Anticoagulants: The patient has                            taken no previous anticoagulant or antiplatelet                            agents. ASA  Grade Assessment: II - A patient with                            mild systemic disease. After reviewing the risks                            and benefits, the patient was deemed in                            satisfactory condition to undergo the procedure.                           - Prior to the procedure, a History and Physical                            was performed, and patient  medications and                            allergies were reviewed. The patient's tolerance of                            previous anesthesia was also reviewed. The risks                            and benefits of the procedure and the sedation                            options and risks were discussed with the patient.                            All questions were answered, and informed consent                            was obtained. Prior Anticoagulants: The patient has                            taken no previous anticoagulant or antiplatelet                            agents. ASA Grade Assessment: II - A patient with                            mild systemic disease. After reviewing the risks                            and benefits, the patient was deemed in                            satisfactory condition to undergo the procedure.                           After obtaining informed consent, the colonoscope  was passed under direct vision. Throughout the                            procedure, the patient's blood pressure, pulse, and                            oxygen saturations were monitored continuously. The                            EC-[id number] ([id number]) scope was introduced through                            the anus and advanced to the the cecum, identified                            by appendiceal orifice and ileocecal valve. The                            colonoscopy was performed without difficulty. The                            patient tolerated the procedure well. The quality                            of the bowel preparation was poor. The ileocecal                            valve, appendiceal orifice, and rectum were                            photographed. Scope In: 9:59:42 AM Scope Out: 10:19:20 AM Scope Withdrawal Time: 0 hours 9 minutes 47 seconds  Total Procedure Duration: 0 hours 19 minutes 38 seconds  Findings:      The perianal and  digital rectal examinations were normal.      A 12 mm polyp was found in the sigmoid colon. The polyp was       semi-pedunculated. The polyp was removed with a hot snare. Resection and       retrieval were complete.      Extensive amounts of semi-liquid semi-solid stool was found in the       recto-sigmoid colon, in the sigmoid colon, in the descending colon, in       the transverse colon, at the hepatic flexure, in the ascending colon, in       the cecum and appendiceal orifice, interfering with visualization.       Lavage of the area was performed using copious amounts, resulting in       incomplete clearance with continued poor visualization.      The retroflexed view of the distal rectum and anal verge was normal and       showed no anal or rectal abnormalities. Impression:               - Preparation of the colon was poor.                           - One  12 mm polyp in the sigmoid colon, removed                            with a hot snare. Resected and retrieved.                           - Stool in the recto-sigmoid colon, in the sigmoid                            colon, in the descending colon, in the transverse                            colon, at the hepatic flexure, in the ascending                            colon, in the cecum and at the appendiceal orifice.                           - The distal rectum and anal verge are normal on                            retroflexion view. Moderate Sedation:      Patient did not receive moderate sedation for this procedure, but       instead received monitored anesthesia care. Recommendation:           - Resume regular diet.                           - Continue present medications.                           - Await pathology results.                           - Repeat colonoscopy in 3 months because the bowel                            preparation was suboptimal.                           - Return patient to hospital ward for ongoing  care. Procedure Code(s):        --- Professional ---                           [phone], Colonoscopy, flexible; with removal of                            tumor(s), polyp(s), or other lesion(s) by snare                            technique Diagnosis Code(s):        --- Professional ---                           D12.5, Benign neoplasm of  sigmoid colon                           D50.9, Iron deficiency anemia, unspecified                           R63.4, Abnormal weight loss CPT copyright 2016 American Medical Association. All rights reserved. The codes documented in this report are preliminary and upon coder review may  be revised to meet current compliance requirements. Ronnette Juniper, MD 02/27/2017 10:30:41 AM This report has been signed electronically. Number of Addenda: 0

## 2017-02-27 NOTE — Anesthesia Procedure Notes (Signed)
Procedure Name: MAC Date/Time: 02/27/2017 9:41 AM Performed by: Barrington Ellison, CRNA Pre-anesthesia Checklist: Patient identified, Emergency Drugs available, Suction available and Patient being monitored Patient Re-evaluated:Patient Re-evaluated prior to induction Oxygen Delivery Method: Nasal cannula

## 2017-02-27 NOTE — Brief Op Note (Signed)
02/26/2017 - 02/27/2017  10:30 AM  PATIENT:  Brittany Cherry  46 y.o. female  PRE-OPERATIVE DIAGNOSIS:  Symptomatic anemia,  POST-OPERATIVE DIAGNOSIS:  mild gastritis  PROCEDURE:  Procedure(s): COLONOSCOPY WITH PROPOFOL (N/A) ESOPHAGOGASTRODUODENOSCOPY (EGD) WITH PROPOFOL (N/A)  SURGEON:  Surgeon(s) and Role:    Ronnette Juniper, MD - Primary  PHYSICIAN ASSISTANT:   ASSISTANTS: Cleda Daub, RN, William Dalton, Tech  ANESTHESIA:   MAC  EBL: None  BLOOD ADMINISTERED:none  DRAINS: none   LOCAL MEDICATIONS USED:  NONE  SPECIMEN:  Biopsy / Limited Resection  DISPOSITION OF SPECIMEN:  PATHOLOGY  COUNTS:  YES  TOURNIQUET:  * No tourniquets in log *  DICTATION: .Dragon Dictation  PLAN OF CARE: Admit to inpatient   PATIENT DISPOSITION:  PACU - hemodynamically stable.   Delay start of Pharmacological VTE agent (>24hrs) due to surgical blood loss or risk of bleeding: no

## 2017-02-28 ENCOUNTER — Encounter (HOSPITAL_COMMUNITY): Payer: Self-pay | Admitting: Gastroenterology

## 2017-02-28 NOTE — Discharge Summary (Signed)
Name: Brittany Cherry MRN: [id number] DOB: Jul 11, 1970 46 y.o. PCP: Fanny Bien, MD  Date of Admission: 02/26/2017  6:28 AM Date of Discharge: 02/27/2017 Attending Physician: Dr. Aldine Contes  Discharge Diagnosis: Active Problems:   Acute anemia   Discharge Medications: Allergies as of 02/27/2017   No Known Allergies     Medication List    STOP taking these medications   HYDROcodone-acetaminophen 5-325 MG tablet Commonly known as:  NORCO/VICODIN   ketorolac 10 MG tablet Commonly known as:  TORADOL   omeprazole 10 MG capsule Commonly known as:  PRILOSEC   OVER THE COUNTER MEDICATION   oxyCODONE 5 MG immediate release tablet Commonly known as:  Oxy IR/ROXICODONE     TAKE these medications   acetaminophen 325 MG tablet Commonly known as:  TYLENOL Take 650 mg every 6 (six) hours as needed by mouth for headache.   ferrous gluconate 324 MG tablet Commonly known as:  FERGON Take 1 tablet (324 mg total) daily with breakfast by mouth.       Disposition and follow-up:   BrittanyBrittany Cherry was discharged from [facility] in Good condition.  At the hospital follow up visit please address:  1.  --Repeat CBC and ensure Hgb stable and no further episodes of bleeding, encourage adherence to oral Fe, consider repeat Ferritin in 2-3 months, additional ferraheme as indicated  --Encourage to keep f/u with GI appt and repeat colonoscopy  --Assess mood and willingness to re-initiate depression tx   2.  Labs / imaging needed at time of follow-up: CBC   3.  Pending labs/ test needing follow-up: EGD and Colonoscopy biopsy results   Follow-up Appointments: Pending GI follow up Encouraged to make appointment with PCP   Hospital Course by problem list:  Acute Blood Loss Anemia, Suspected GI Source She presented with a several month history of intermittent epigastric pain which worsened in the 7-10 days prior to presentation with associated  nausea, vomiting and one episode of bloody stool in the setting of regular BC powders/NSAID use.  Her hemoglobin was found to be 6.6, she received 2 units RBC transfusion.  GI performed EGD which showed mild erythema, biopsies collected for H pylori and celiac disease, colonoscopy showed a sigmoid polyp which was removed and sent for pathology.  Biopsy/pathology results pending at the time of discharge.  She will follow-up in GI in 2-3 weeks and also have a repeat colonoscopy in 3 months with better bowel prep.  Her hemoglobin remained stable after transfusion and was 8.5 on discharge.  Chronic Iron Deficiency Anemia Patient has a history of chronic iron deficiency anemia with reports of heavy menstrual periods since the birth of her 57-year-old son and is likely perimenopausal given her age.  MCV was found to be 53, ferritin 3 consistent with iron deficiency which is likely exacerbated any acute blood loss described above.  She received IV Feraheme transfusion prior to discharge and was prescribed oral ferrous gluconate in an attempt to improve tolerance and adherence to iron supplementation.  H/o Depression Patient has a history of depression previously treated with Lexapro which she self discontinued 8 months ago due to insurance reasons.  She reported her mood was decreased due to relationship issues and stress associated with caring for her 29-year-old son with autism.  She denied suicidal ideation, homicidal ideation.  Did report improvement in her overall mood  prior to discharge but would like to further address this as an outpatient.   Discharge Vitals:  BP 132/69   Pulse 61   Temp 98.4 F (36.9 C) (Oral)   Resp 19   Ht 5\' 2"  (1.575 m)   Wt 190 lb (86.2 kg)   LMP 02/16/2017   SpO2 100%   BMI 34.75 kg/m   Pertinent Labs, Studies, and Procedures:  CBC Latest Ref Rng & Units 02/27/2017 02/27/2017 02/26/2017  WBC 4.0 - 10.5 K/uL 7.5 8.4 7.8  Hemoglobin 12.0 - 15.0 g/dL 8.5(L) 8.3(L)  8.5(L)  Hematocrit 36.0 - 46.0 % 28.2(L) 27.0(L) 28.6(L)  Platelets 150 - 400 K/uL 223 222 239     Discharge Instructions: Discharge Instructions    Discharge instructions   Complete by:  As directed    It was pleasure taking care of you Brittany Cherry. You will follow-up with GI doctors in 2-3 weeks to go over your results and see how you are doing.  You will also have a repeat colonoscopy in 3 months. Be sure to make an appointment with your primary care doctor to see how you are doing and how you are tolerating the new type of Iron pills. Try to use Tylenol instead of BC powder or ibuprofen (or other NSAIDs) as this can irritate your stomach      Signed: Tawny Asal, MD 02/28/2017, 11:52 AM   Pager: [phone]

## 2017-04-26 ENCOUNTER — Encounter (HOSPITAL_COMMUNITY): Payer: Self-pay | Admitting: Emergency Medicine

## 2017-04-26 ENCOUNTER — Ambulatory Visit (HOSPITAL_COMMUNITY)
Admission: EM | Admit: 2017-04-26 | Discharge: 2017-04-26 | Disposition: A | Discharge status: Home / Self Care | Payer: PRIVATE HEALTH INSURANCE | Attending: Family Medicine | Admitting: Family Medicine

## 2017-04-26 DIAGNOSIS — L739 Follicular disorder, unspecified: Secondary | ICD-10-CM | POA: Diagnosis not present

## 2017-04-26 MED ORDER — CEPHALEXIN 500 MG PO CAPS: 500.0000 mg | ORAL_CAPSULE | Freq: Three times a day (TID) | ORAL | 0 refills | Status: DC

## 2017-04-26 NOTE — ED Triage Notes (Addendum)
PT C/O: poss external hemorrhoid or an abscess onset 3 days that his painful  Sts she had this in the past but never went to a doctor to have it looked at... Sts it went away but to completely  Sts it hurts/burns when she voids urine.   DENIES: discharge, blood  TAKING MEDS: none   A&O x4... NAD... Ambulatory

## 2017-04-26 NOTE — ED Provider Notes (Signed)
[facility]   [id number] 04/26/17 Arrival Time: 3893  ASSESSMENT & PLAN:  1. Folliculitis     Meds ordered this encounter  Medications  . cephALEXin (KEFLEX) 500 MG capsule    Sig: Take 1 capsule (500 mg total) by mouth 3 (three) times daily.    Dispense:  21 capsule    Refill:  0   Will f/u with her PCP or here if area is enlarging or not going away. Reviewed expectations re: course of current medical issues. Questions answered. Outlined signs and symptoms indicating need for more acute intervention. Patient verbalized understanding. After Visit Summary given.   SUBJECTIVE:  Brittany Cherry is a 47 y.o. female who presents with complaint of "a little bump" near rectum. Noticed about 2-3 days ago. "Kind of sore." No worsening. Afebrile. No bleeding with bowel movement. No drainage from area. She feels that she has had a similar episode several months ago that resolved without treatment. Last BM yesterday. Weight stable. Normal appetite and PO intake. No specific aggravating or alleviating factors reported.. OTC treatment: None.  Patient's last menstrual period was 04/05/2017.   Past Surgical History:  Procedure Laterality Date  . COLONOSCOPY WITH PROPOFOL N/A 02/27/2017   Procedure: COLONOSCOPY WITH PROPOFOL;  Surgeon: Ronnette Juniper, MD;  Location: [facility];  Service: Gastroenterology;  Laterality: N/A;  . DILATION AND CURETTAGE OF UTERUS    . ESOPHAGOGASTRODUODENOSCOPY (EGD) WITH PROPOFOL N/A 02/27/2017   Procedure: ESOPHAGOGASTRODUODENOSCOPY (EGD) WITH PROPOFOL;  Surgeon: Ronnette Juniper, MD;  Location: [place];  Service: Gastroenterology;  Laterality: N/A;  . TUBAL LIGATION Bilateral 08/18/2013   Procedure: POST PARTUM TUBAL LIGATION;  Surgeon: Florian Buff, MD;  Location: [facility];  Service: Gynecology;  Laterality: Bilateral;  . WISDOM TOOTH EXTRACTION      ROS: As per HPI.  OBJECTIVE:  Vitals:   04/26/17 1027  BP: 117/81  Pulse: 67  Resp: 20    Temp: 98.3 F (36.8 C)  TempSrc: Oral  SpO2: 99%    General appearance: alert; no distress Lungs: clear to auscultation bilaterally Heart: regular rate and rhythm Abdomen: soft; non-distended; no tenderness; bowel sounds present; no masses or organomegaly; no guarding or rebound tenderness Rectal: normal tone; no bleeding; just inferior to L side of rectum is a small whitehead around hair follicle; tender to touch; no drainage; approx 5-73mm in size Back: no CVA tenderness Skin: warm and dry Psychological: alert and cooperative; normal mood and affect  No Known Allergies                                             Past Medical History:  Diagnosis Date  . AMA (advanced maternal age) multigravida 78+   . Depression   . GERD (gastroesophageal reflux disease)   . Hyperemesis gravidarum    Social History   Socioeconomic History  . Marital status: Single    Spouse name: Not on file  . Number of children: Not on file  . Years of education: Not on file  . Highest education level: Not on file  Social Needs  . Financial resource strain: Not on file  . Food insecurity - worry: Not on file  . Food insecurity - inability: Not on file  . Transportation needs - medical: Not on file  . Transportation needs - non-medical: Not on file  Occupational History  . Not on file  Tobacco Use  . Smoking status: Current Every Day Smoker  . Smokeless tobacco: Never Used  Substance and Sexual Activity  . Alcohol use: Yes  . Drug use: No  . Sexual activity: Yes  Other Topics Concern  . Not on file  Social History Narrative  . Not on file      Vanessa Kick, MD 04/26/17 1056

## 2017-04-26 NOTE — ED Notes (Signed)
Chaperoned Dr. Mannie Stabile so he could assess the pt's rectum for pain.  Pt tolerated it well.  Pt was left in room alone to get dressed.

## 2017-06-02 ENCOUNTER — Other Ambulatory Visit: Payer: Self-pay | Admitting: Physician Assistant

## 2017-06-02 DIAGNOSIS — Z1231 Encounter for screening mammogram for malignant neoplasm of breast: Secondary | ICD-10-CM

## 2017-06-26 ENCOUNTER — Ambulatory Visit: Payer: PRIVATE HEALTH INSURANCE

## 2018-04-09 ENCOUNTER — Other Ambulatory Visit: Payer: Self-pay

## 2018-04-09 ENCOUNTER — Emergency Department (HOSPITAL_COMMUNITY): Payer: Managed Care, Other (non HMO)

## 2018-04-09 ENCOUNTER — Other Ambulatory Visit: Payer: Self-pay | Admitting: Physician Assistant

## 2018-04-09 ENCOUNTER — Emergency Department (HOSPITAL_COMMUNITY)
Admission: EM | Admit: 2018-04-09 | Discharge: 2018-04-09 | Disposition: A | Discharge status: Home / Self Care | Payer: Managed Care, Other (non HMO) | Attending: Emergency Medicine | Admitting: Emergency Medicine

## 2018-04-09 DIAGNOSIS — R42 Dizziness and giddiness: Secondary | ICD-10-CM | POA: Diagnosis not present

## 2018-04-09 DIAGNOSIS — Y939 Activity, unspecified: Secondary | ICD-10-CM | POA: Diagnosis not present

## 2018-04-09 DIAGNOSIS — F17200 Nicotine dependence, unspecified, uncomplicated: Secondary | ICD-10-CM | POA: Insufficient documentation

## 2018-04-09 DIAGNOSIS — S4992XA Unspecified injury of left shoulder and upper arm, initial encounter: Secondary | ICD-10-CM | POA: Diagnosis present

## 2018-04-09 DIAGNOSIS — X58XXXA Exposure to other specified factors, initial encounter: Secondary | ICD-10-CM | POA: Diagnosis not present

## 2018-04-09 DIAGNOSIS — R0602 Shortness of breath: Secondary | ICD-10-CM | POA: Insufficient documentation

## 2018-04-09 DIAGNOSIS — Y999 Unspecified external cause status: Secondary | ICD-10-CM | POA: Diagnosis not present

## 2018-04-09 DIAGNOSIS — S46912A Strain of unspecified muscle, fascia and tendon at shoulder and upper arm level, left arm, initial encounter: Secondary | ICD-10-CM | POA: Insufficient documentation

## 2018-04-09 DIAGNOSIS — Y929 Unspecified place or not applicable: Secondary | ICD-10-CM | POA: Insufficient documentation

## 2018-04-09 LAB — POCT I-STAT TROPONIN I: Troponin i, poc: 0 ng/mL (ref 0.00–0.08)

## 2018-04-09 LAB — CBC
HEMATOCRIT: 32.3 % — AB (ref 36.0–46.0)
Hemoglobin: 9.7 g/dL — ABNORMAL LOW (ref 12.0–15.0)
MCH: 19.6 pg — ABNORMAL LOW (ref 26.0–34.0)
MCHC: 30 g/dL (ref 30.0–36.0)
MCV: 65.1 fL — ABNORMAL LOW (ref 80.0–100.0)
Platelets: 379 10*3/uL (ref 150–400)
RBC: 4.96 MIL/uL (ref 3.87–5.11)
RDW: 20.3 % — AB (ref 11.5–15.5)
WBC: 7.7 10*3/uL (ref 4.0–10.5)
nRBC: 0 % (ref 0.0–0.2)

## 2018-04-09 LAB — I-STAT BETA HCG BLOOD, ED (NOT ORDERABLE): I-stat hCG, quantitative: <5 m[IU]/mL (ref <5)

## 2018-04-09 LAB — BASIC METABOLIC PANEL
Anion gap: 9 (ref 5–15)
BUN: 12 mg/dL (ref 6–20)
CO2: 25 mmol/L (ref 22–32)
CREATININE: 0.83 mg/dL (ref 0.44–1.00)
Calcium: 9.1 mg/dL (ref 8.9–10.3)
Chloride: 105 mmol/L (ref 98–111)
GFR calc Af Amer: >60 mL/min (ref >60)
GFR calc non Af Amer: >60 mL/min (ref >60)
Glucose, Bld: 105 mg/dL — ABNORMAL HIGH (ref 70–99)
Potassium: 3.6 mmol/L (ref 3.5–5.1)
Sodium: 139 mmol/L (ref 135–145)

## 2018-04-09 MED ORDER — DICLOFENAC SODIUM 1 % TD GEL: 2.0000 g | Freq: Four times a day (QID) | TRANSDERMAL | 0 refills | Status: DC | PRN

## 2018-04-09 NOTE — ED Provider Notes (Signed)
[facility] DEPT Provider Note   CSN: [id number] Arrival date & time: 04/09/18  1933     History   Chief Complaint Chief Complaint  Patient presents with  . Arm Pain  . Dizziness    HPI Brittany Cherry is a 47 y.o. female.  The history is provided by the patient. No language interpreter was used.  Arm Pain   Dizziness   Brittany Cherry is a 47 y.o. female who presents to the Emergency Department complaining of arm pain. She presents the emergency department complaining of left shoulder and arm pain that began yesterday. Pain is sharp in nature and intermittent. It is worse with range of motion as well as occurs without any clear trigger. Today she had recurrent pain. Pain radiates up to the left side of her posterior neck. Today she developed transient dizziness and shortness of breath that she thinks may have been a panic attack. She has chronic abdominal pain, unchanged from baseline. She denies any nausea, vomiting, diarrhea, leg swelling or pain. She has a history of anemia and takes iron supplementation. She denies any history of hypertension, diabetes, blood clots. Past Medical History:  Diagnosis Date  . AMA (advanced maternal age) multigravida 27+   . Depression   . GERD (gastroesophageal reflux disease)   . Hyperemesis gravidarum     Patient Active Problem List   Diagnosis Date Noted  . Acute anemia 02/26/2017  . Chronic pain of both knees 06/07/2016  . NSVD (normal spontaneous vaginal delivery) 08/20/2013  . S/P tubal ligation 08/20/2013  . PROM (premature rupture of membranes) 08/18/2013  . Elderly multigravida with antepartum condition or complication 33/29/5188    Past Surgical History:  Procedure Laterality Date  . COLONOSCOPY WITH PROPOFOL N/A 02/27/2017   Procedure: COLONOSCOPY WITH PROPOFOL;  Surgeon: Ronnette Juniper, MD;  Location: [facility];  Service: Gastroenterology;  Laterality: N/A;  . DILATION AND CURETTAGE OF  UTERUS    . ESOPHAGOGASTRODUODENOSCOPY (EGD) WITH PROPOFOL N/A 02/27/2017   Procedure: ESOPHAGOGASTRODUODENOSCOPY (EGD) WITH PROPOFOL;  Surgeon: Ronnette Juniper, MD;  Location: [facility];  Service: Gastroenterology;  Laterality: N/A;  . TUBAL LIGATION Bilateral 08/18/2013   Procedure: POST PARTUM TUBAL LIGATION;  Surgeon: Florian Buff, MD;  Location: [facility];  Service: Gynecology;  Laterality: Bilateral;  . WISDOM TOOTH EXTRACTION       OB History    Gravida  8   Para  4   Term  4   Preterm      AB  4   Living  4     SAB  2   TAB  2   Ectopic      Multiple      Live Births  4            Home Medications    Prior to Admission medications   Medication Sig Start Date End Date Taking? Authorizing Provider  Aspirin-Salicylamide-Caffeine (BC HEADACHE PO) Take 1 packet by mouth daily as needed (pain).   Yes [provider]  IRON PO Take 1 capsule by mouth daily.   Yes [provider]  cephALEXin (KEFLEX) 500 MG capsule Take 1 capsule (500 mg total) by mouth 3 (three) times daily. Patient not taking: Reported on 04/09/2018 04/26/17   Vanessa Kick, MD  diclofenac sodium (VOLTAREN) 1 % GEL Apply 2 g topically 4 (four) times daily as needed. 04/09/18   Quintella Reichert, MD  ferrous gluconate (FERGON) 324 MG tablet Take 1 tablet (324 mg total) daily  with breakfast by mouth. Patient not taking: Reported on 04/09/2018 02/27/17   Burgess Estelle, MD    Family History No family history on file.  Social History Social History   Tobacco Use  . Smoking status: Current Every Day Smoker  . Smokeless tobacco: Never Used  Substance Use Topics  . Alcohol use: Yes  . Drug use: No     Allergies   Ativan [lorazepam]   Review of Systems Review of Systems  Neurological: Positive for dizziness.  All other systems reviewed and are negative.    Physical Exam Updated Vital Signs BP 132/72 (BP Location: Right Arm)   Pulse (!) 58   Resp 19   LMP 04/08/2018    SpO2 99%   Physical Exam Vitals signs and nursing note reviewed.  Constitutional:      Appearance: She is well-developed.  HENT:     Head: Normocephalic and atraumatic.  Cardiovascular:     Rate and Rhythm: Normal rate and regular rhythm.     Heart sounds: No murmur.  Pulmonary:     Effort: Pulmonary effort is normal. No respiratory distress.     Breath sounds: Normal breath sounds.  Abdominal:     Palpations: Abdomen is soft.     Tenderness: There is no abdominal tenderness. There is no guarding or rebound.  Musculoskeletal:     Comments: Tenderness to palpation over the left shoulder laterally as well as along the left trapezius. There is no midline and neck tenderness. 2+ radial pulses bilaterally. No lower extremity edema. Able to range the left shoulder.  Skin:    General: Skin is warm and dry.  Neurological:     Mental Status: She is alert and oriented to person, place, and time.     Comments: Five out of five strength in all four extremities with sensation to light touch intact in all four extremities  Psychiatric:        Mood and Affect: Mood normal.        Behavior: Behavior normal.      ED Treatments / Results  Labs (all labs ordered are listed, but only abnormal results are displayed) Labs Reviewed  BASIC METABOLIC PANEL - Abnormal; Notable for the following components:      Result Value   Glucose, Bld 105 (*)    All other components within normal limits  CBC - Abnormal; Notable for the following components:   Hemoglobin 9.7 (*)    HCT 32.3 (*)    MCV 65.1 (*)    MCH 19.6 (*)    RDW 20.3 (*)    All other components within normal limits  I-STAT TROPONIN, ED  I-STAT BETA HCG BLOOD, ED ([facility], [facility], [facility] ONLY)  POCT I-STAT TROPONIN I  I-STAT BETA HCG BLOOD, ED (NOT ORDERABLE)    EKG EKG Interpretation  Date/Time:  Monday April 09 2018 20:08:39 EST Ventricular Rate:  66 PR Interval:    QRS Duration: 93 QT Interval:  396 QTC Calculation: 415 R  Axis:   48 Text Interpretation:  Sinus rhythm Abnormal R-wave progression, early transition Confirmed by Quintella Reichert [phone]) on 04/09/2018 9:56:47 PM   Radiology Dg Chest 2 View  Result Date: 04/09/2018 CLINICAL DATA:  Pain and shortness of breath EXAM: CHEST - 2 VIEW COMPARISON:  April 20, 2015 FINDINGS: There is no appreciable edema or consolidation. The heart size and pulmonary vascularity are normal. No adenopathy. No pneumothorax. No bone lesions. IMPRESSION: No edema or consolidation. Electronically Signed   By: Gwyndolyn Saxon  Jasmine December III M.D.   On: 04/09/2018 20:36    Procedures Procedures (including critical care time)  Medications Ordered in ED Medications - No data to display   Initial Impression / Assessment and Plan / ED Course  I have reviewed the triage vital signs and the nursing notes.  Pertinent labs & imaging results that were available during my care of the patient were reviewed by me and considered in my medical decision making (see chart for details).     Patient with history of anemia here for evaluation of pain in the left arm and shoulder. She is non-toxic appearing on evaluation with no respiratory distress. Pain is reproducible on palpation. Current presentation is not consistent with ACS, PE, dissection. Labs demonstrate improved anemia when compared to priors. Discussed with patient home care for muscle strain. Discussed outpatient follow-up and return precautions.  Final Clinical Impressions(s) / ED Diagnoses   Final diagnoses:  Strain of left shoulder, initial encounter    ED Discharge Orders         Ordered    diclofenac sodium (VOLTAREN) 1 % GEL  4 times daily PRN     04/09/18 2220           Quintella Reichert, MD 04/09/18 2224

## 2018-04-09 NOTE — ED Triage Notes (Addendum)
Pt reports R upper arm pain which started last night with dizziness and sob.  She reports last night, pain was intermittent, but became constant today.  She also endorses anxiety today when her arm pain worsened.  Denies any tingling or numbness

## 2018-06-22 ENCOUNTER — Ambulatory Visit: Payer: Managed Care, Other (non HMO) | Admitting: Family Medicine

## 2018-06-22 ENCOUNTER — Encounter: Payer: Self-pay | Admitting: Family Medicine

## 2018-06-22 ENCOUNTER — Other Ambulatory Visit: Payer: Self-pay

## 2018-06-22 VITALS — BP 124/82 | HR 61 | Resp 16 | Ht 62.0 in | Wt 187.0 lb

## 2018-06-22 DIAGNOSIS — M170 Bilateral primary osteoarthritis of knee: Secondary | ICD-10-CM | POA: Diagnosis not present

## 2018-06-22 MED ORDER — DICLOFENAC SODIUM 2 % TD SOLN: 1.0000 "application " | Freq: Two times a day (BID) | TRANSDERMAL | 3 refills | Status: DC

## 2018-06-22 NOTE — Patient Instructions (Signed)
Nice to meet you  Take tylenol 650 mg three times a day is the best evidence based medicine we have for arthritis.  Glucosamine sulfate 750mg  twice a day is a supplement that has been shown to help moderate to severe arthritis. Vitamin D 2000 IU daily Fish oil 2 grams daily.  Tumeric 500mg  twice daily.  Capsaicin topically up to four times a day may also help with pain. Please try Aspercreme with lidocaine on the knees. Please try ice and compression. Please see me back if your shoulder does not seem to make any improvement. Please see me back in 2 to 3 weeks if your knees seem to be not improving.  We could try gel injections.

## 2018-06-22 NOTE — Progress Notes (Signed)
Brittany Cherry - 48 y.o. female MRN [id number]  Date of birth: 10-21-1970  SUBJECTIVE:  Including CC & ROS.  No chief complaint on file.   Brittany Cherry is a 48 y.o. female that is  presenting with bilateral knee pain.  She has a history of degenerative changes.  The pain is occurring over the medial aspect.  This is acute on chronic and worsening.  She is gotten injections late last year.  She denies any trauma or inciting event.  The pain is a throbbing in nature the pain is worse with certain movements and at the end of the day.  Has had arthroscopy in the left knee.   Review of Systems  Constitutional: Negative for fever.  HENT: Negative for congestion.   Respiratory: Negative for cough.   Cardiovascular: Negative for chest pain.  Gastrointestinal: Negative for abdominal pain.  Musculoskeletal: Positive for arthralgias.  Skin: Negative for color change.  Neurological: Negative for weakness.  Hematological: Negative for adenopathy.  Psychiatric/Behavioral: Negative for agitation.    HISTORY: Past Medical, Surgical, Social, and Family History Reviewed & Updated per EMR.   Pertinent Historical Findings include:  Past Medical History:  Diagnosis Date  . AMA (advanced maternal age) multigravida 37+   . Depression   . GERD (gastroesophageal reflux disease)   . Hyperemesis gravidarum     Past Surgical History:  Procedure Laterality Date  . COLONOSCOPY WITH PROPOFOL N/A 02/27/2017   Procedure: COLONOSCOPY WITH PROPOFOL;  Surgeon: Ronnette Juniper, MD;  Location: [facility];  Service: Gastroenterology;  Laterality: N/A;  . DILATION AND CURETTAGE OF UTERUS    . ESOPHAGOGASTRODUODENOSCOPY (EGD) WITH PROPOFOL N/A 02/27/2017   Procedure: ESOPHAGOGASTRODUODENOSCOPY (EGD) WITH PROPOFOL;  Surgeon: Ronnette Juniper, MD;  Location: [facility];  Service: Gastroenterology;  Laterality: N/A;  . TUBAL LIGATION Bilateral 08/18/2013   Procedure: POST PARTUM TUBAL LIGATION;  Surgeon: Florian Buff, MD;  Location: [facility];  Service: Gynecology;  Laterality: Bilateral;  . WISDOM TOOTH EXTRACTION      Allergies  Allergen Reactions  . Ativan [Lorazepam] Itching    Feet, Hands, Face    No family history on file.   Social History   Socioeconomic History  . Marital status: Single    Spouse name: Not on file  . Number of children: Not on file  . Years of education: Not on file  . Highest education level: Not on file  Occupational History  . Not on file  Social Needs  . Financial resource strain: Not on file  . Food insecurity:    Worry: Not on file    Inability: Not on file  . Transportation needs:    Medical: Not on file    Non-medical: Not on file  Tobacco Use  . Smoking status: Current Every Day Smoker  . Smokeless tobacco: Never Used  Substance and Sexual Activity  . Alcohol use: Yes  . Drug use: No  . Sexual activity: Yes  Lifestyle  . Physical activity:    Days per week: Not on file    Minutes per session: Not on file  . Stress: Not on file  Relationships  . Social connections:    Talks on phone: Not on file    Gets together: Not on file    Attends religious service: Not on file    Active member of club or organization: Not on file    Attends meetings of clubs or organizations: Not on file    Relationship status: Not on file  .  Intimate partner violence:    Fear of current or ex partner: Not on file    Emotionally abused: Not on file    Physically abused: Not on file    Forced sexual activity: Not on file  Other Topics Concern  . Not on file  Social History Narrative  . Not on file     PHYSICAL EXAM:  VS: BP 124/82   Pulse 61   Resp 16   Ht 5\' 2"  (1.575 m)   Wt 187 lb (84.8 kg)   SpO2 98%   BMI 34.20 kg/m  Physical Exam Gen: NAD, alert, cooperative with exam, well-appearing ENT: normal lips, normal nasal mucosa,  Eye: normal EOM, normal conjunctiva and lids CV:  no edema, +2 pedal pulses   Resp: no accessory muscle use,  non-labored,  Skin: no rashes, no areas of induration  Neuro: normal tone, normal sensation to touch Psych:  normal insight, alert and oriented MSK:  Left and right knee: Normal to inspection with no erythema or effusion or obvious bony abnormalities. Palpation normal with no warmth, Tenderness to palpation over the medial joint line Normal range of motion Ligaments with solid consistent endpoints including  LCL, MCL. Negative Mcmurray's  tests. Non painful patellar compression. Patellar glide without crepitus. Patellar and quadriceps tendons unremarkable. Hamstring and quadriceps strength is normal.  Neurovascularly intact     Aspiration/Injection Procedure Note Brittany Cherry [birth year]  Procedure: Injection Indications: right knee pain   Procedure Details Consent: Risks of procedure as well as the alternatives and risks of each were explained to the (patient/caregiver).  Consent for procedure obtained. Time Out: Verified patient identification, verified procedure, site/side was marked, verified correct patient position, special equipment/implants available, medications/allergies/relevent history reviewed, required imaging and test results available.  Performed.  The area was cleaned with iodine and alcohol swabs.    The right knee superior lateral suprapatellar pouch was injected using 1 cc's of 40 mg kenalog and 4 cc's of 0.5% bupivacaine with a 22 2" needle.  Ultrasound was used. Images were obtained in long views showing the injection.     A sterile dressing was applied.  Patient did tolerate procedure well.   Aspiration/Injection Procedure Note Brittany Cherry 16-Jan-1971  Procedure: Injection Indications: Left knee pain  Procedure Details Consent: Risks of procedure as well as the alternatives and risks of each were explained to the (patient/caregiver).  Consent for procedure obtained. Time Out: Verified patient identification, verified procedure, site/side  was marked, verified correct patient position, special equipment/implants available, medications/allergies/relevent history reviewed, required imaging and test results available.  Performed.  The area was cleaned with iodine and alcohol swabs.    The left knee superior lateral suprapatellar pouch was injected using 1 cc's of 40 mg Kenalog and 4 cc's of 0.5% bupivacaine with a 21 2" needle.  Ultrasound was used. Images were obtained in long views showing the injection.     A sterile dressing was applied.  Patient did tolerate procedure well.      ASSESSMENT & PLAN:   Primary osteoarthritis of both knees Reports a history of degenerative changes.  No inciting event.  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 -Bilateral injections today. -Pennsaid. -Counseled on home exercise therapy and supportive care. -Could consider imaging or physical therapy or gel injections.

## 2018-06-22 NOTE — Assessment & Plan Note (Signed)
Reports a history of degenerative changes.  No inciting event.  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 -Bilateral injections today. -Pennsaid. -Counseled on home exercise therapy and supportive care. -Could consider imaging or physical therapy or gel injections.

## 2018-10-26 ENCOUNTER — Other Ambulatory Visit: Payer: Self-pay

## 2018-10-26 ENCOUNTER — Ambulatory Visit
Admission: EM | Admit: 2018-10-26 | Discharge: 2018-10-26 | Disposition: A | Discharge status: Home / Self Care | Payer: Managed Care, Other (non HMO) | Attending: Internal Medicine | Admitting: Internal Medicine

## 2018-10-26 ENCOUNTER — Ambulatory Visit (INDEPENDENT_AMBULATORY_CARE_PROVIDER_SITE_OTHER): Payer: Managed Care, Other (non HMO)

## 2018-10-26 DIAGNOSIS — R0789 Other chest pain: Secondary | ICD-10-CM

## 2018-10-26 MED ORDER — OMEPRAZOLE 20 MG PO CPDR: 20.0000 mg | DELAYED_RELEASE_CAPSULE | Freq: Every day | ORAL | 0 refills | Status: DC

## 2018-10-26 MED ORDER — NAPROXEN 500 MG PO TABS: 500.0000 mg | ORAL_TABLET | Freq: Two times a day (BID) | ORAL | 0 refills | Status: DC

## 2018-10-26 NOTE — ED Provider Notes (Signed)
[facility]    CSN: [id number] Arrival date & time: 10/26/18  1630      History   Chief Complaint Chief Complaint  Patient presents with  . Chest Pain    HPI Brittany Cherry is a 48 y.o. female.   Brain Hilts presents with complaints of left sided chest wall/ breast pain which she has been feeling intermittently for the past three days. Stabbing/ pinching type sensation. Woke her from sleep last night even. She has noted some night sweats and dizziness at times, but feels like this is related to menopause possibly. Her period just ended, wasn't heavier than normal but she did note clots. States her periods have been irregular recently. No shortness of breath . States when the symptoms occur she starts to feel some "panic" which  Makes her feel shortness of breath. Otherwise no shortness of breath . No cough or URI symptoms. No pinching pain currently but feels like her left side is "heavy." states she has a history of gerd, gas and ulcers. Has reacquired blood transfusion in the past due to gi bleed. She has noticed she has been eating ice more recently and is fatigued. Denies any cardiac history. No family cardiac history. She used to smoke, no longer does. Gets mammograms regularly, last was end of last year. States that left breast often has fibrous changes but it has always checked out fine. 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 Seems to worsen when at rest while at home. Has appointment next week with her PCP.   ROS per HPI, negative if not otherwise mentioned.       Past Medical History:  Diagnosis Date  . AMA (advanced maternal age) multigravida 61+   . Depression   . GERD (gastroesophageal reflux disease)   . Hyperemesis gravidarum     Patient Active Problem List   Diagnosis Date Noted  . Primary osteoarthritis of both knees 06/22/2018  . Acute anemia 02/26/2017  . Chronic pain of both knees 06/07/2016   . NSVD (normal spontaneous vaginal delivery) 08/20/2013  . S/P tubal ligation 08/20/2013  . PROM (premature rupture of membranes) 08/18/2013  . Elderly multigravida with antepartum condition or complication 34/74/2595    Past Surgical History:  Procedure Laterality Date  . COLONOSCOPY WITH PROPOFOL N/A 02/27/2017   Procedure: COLONOSCOPY WITH PROPOFOL;  Surgeon: Ronnette Juniper, MD;  Location: [facility];  Service: Gastroenterology;  Laterality: N/A;  . DILATION AND CURETTAGE OF UTERUS    . ESOPHAGOGASTRODUODENOSCOPY (EGD) WITH PROPOFOL N/A 02/27/2017   Procedure: ESOPHAGOGASTRODUODENOSCOPY (EGD) WITH PROPOFOL;  Surgeon: Ronnette Juniper, MD;  Location: [facility];  Service: Gastroenterology;  Laterality: N/A;  . TUBAL LIGATION Bilateral 08/18/2013   Procedure: POST PARTUM TUBAL LIGATION;  Surgeon: Florian Buff, MD;  Location: [facility];  Service: Gynecology;  Laterality: Bilateral;  . WISDOM TOOTH EXTRACTION      OB History    Gravida  8   Para  4   Term  4   Preterm      AB  4   Living  4     SAB  2   TAB  2   Ectopic      Multiple      Live Births  4            Home Medications    Prior to Admission medications   Medication Sig Start Date End Date Taking? Authorizing Provider  Aspirin-Salicylamide-Caffeine [facility]  HEADACHE PO) Take 1 packet by mouth daily as needed (pain).    [provider]  Diclofenac Sodium (PENNSAID) 2 % SOLN Place 1 application onto the skin 2 (two) times daily. 06/22/18   Rosemarie Ax, MD  diclofenac sodium (VOLTAREN) 1 % GEL Apply 2 g topically 4 (four) times daily as needed. 04/09/18   Quintella Reichert, MD  ferrous gluconate (FERGON) 324 MG tablet Take 1 tablet (324 mg total) daily with breakfast by mouth. 02/27/17   Burgess Estelle, MD  IRON PO Take 1 capsule by mouth daily.    [provider]  naproxen (NAPROSYN) 500 MG tablet Take 1 tablet (500 mg total) by mouth 2 (two) times daily. 10/26/18   Zigmund Gottron, NP   omeprazole (PRILOSEC) 20 MG capsule Take 1 capsule (20 mg total) by mouth daily. 10/26/18   Zigmund Gottron, NP    Family History No family history on file.  Social History Social History   Tobacco Use  . Smoking status: Current Every Day Smoker  . Smokeless tobacco: Never Used  Substance Use Topics  . Alcohol use: Yes  . Drug use: No     Allergies   Ativan [lorazepam]   Review of Systems Review of Systems   Physical Exam Triage Vital Signs ED Triage Vitals  Enc Vitals Group     BP 10/26/18 1632 134/78     Pulse Rate 10/26/18 1632 73     Resp 10/26/18 1632 18     Temp 10/26/18 1632 98.1 F (36.7 C)     Temp Source 10/26/18 1632 Oral     SpO2 10/26/18 1632 99 %     Weight --      Height --      Head Circumference --      Peak Flow --      Pain Score 10/26/18 1633 2     Pain Loc --      Pain Edu? --      Excl. in [place]? --    No data found.  Updated Vital Signs BP 134/78 (BP Location: Left Arm)   Pulse 73   Temp 98.1 F (36.7 C) (Oral)   Resp 18   LMP 10/21/2018   SpO2 99%   Visual Acuity Right Eye Distance:   Left Eye Distance:   Bilateral Distance:    Right Eye Near:   Left Eye Near:    Bilateral Near:     Physical Exam Constitutional:      General: She is not in acute distress.    Appearance: She is well-developed.  Cardiovascular:     Rate and Rhythm: Normal rate and regular rhythm.     Heart sounds: Normal heart sounds.  Pulmonary:     Effort: Pulmonary effort is normal.     Breath sounds: Normal breath sounds.  Chest:     Chest wall: Tenderness present. No swelling.       Comments: Point tenderness to left chest wall underlying breast tissue; no obvious deformity or mass palpable  Skin:    General: Skin is warm and dry.  Neurological:     Mental Status: She is alert and oriented to person, place, and time.    EKG:  NSR rate of 69. Previous EKG was available for review. No stwave changes as interpreted by me.    UC Treatments /  Results  Labs (all labs ordered are listed, but only abnormal results are displayed) Labs Reviewed  CBC WITH DIFFERENTIAL/PLATELET  EKG   Radiology Dg Chest 2 View  Result Date: 10/26/2018 CLINICAL DATA:  Sternal chest pain for 3 days. Night sweats and dizziness for 2 weeks. Former smoker. EXAM: CHEST - 2 VIEW COMPARISON:  04/09/2018 FINDINGS: The cardiomediastinal silhouette is unchanged and within normal limits. No airspace consolidation, edema, pleural effusion, pneumothorax is identified. No acute osseous abnormality is seen. IMPRESSION: No active cardiopulmonary disease. Electronically Signed   By: Logan Bores M.D.   On: 10/26/2018 16:53    Procedures Procedures (including critical care time)  Medications Ordered in UC Medications - No data to display  Initial Impression / Assessment and Plan / UC Course  I have reviewed the triage vital signs and the nursing notes.  Pertinent labs & imaging results that were available during my care of the patient were reviewed by me and considered in my medical decision making (see chart for details).     ekg and chest xray without acute findings. Vitals stable. Symptoms improve with activity and while at work. Minimal cardiac risk factors. Acs, anemia, costochondritis, breast etiology considered. Order for breast imaging provided. Pain management discussed. Encouraged close follow up with pcp. Return precautions provided. Patient verbalized understanding and agreeable to plan.   Final Clinical Impressions(s) / UC Diagnoses   Final diagnoses:  Left-sided chest wall pain     Discharge Instructions     Your EKG and Chest Xray are normal today, which is reassuring.  We will check your blood levels to check for anemia. Will notify you if any changes to treatment are needed.   I will place order for ultrasound of the breast to further evaluate your symptoms as well. You will get a phone call to get this scheduled.  Omeprazole daily to  prevent any ulceration or gi bleeding.  Please continue to follow with your primary care provider as scheduled for further evaluation and treatment.  If develop worsening of pain, shortness of breath , nausea, sweating or otherwise worsening please go to the ER.    ED Prescriptions    Medication Sig Dispense Auth. Provider   naproxen (NAPROSYN) 500 MG tablet Take 1 tablet (500 mg total) by mouth 2 (two) times daily. 30 tablet Augusto Gamble B, NP   omeprazole (PRILOSEC) 20 MG capsule Take 1 capsule (20 mg total) by mouth daily. 30 capsule Zigmund Gottron, NP     Controlled Substance Prescriptions [place] Controlled Substance Registry consulted? Not Applicable   Zigmund Gottron, NP 10/26/18 1732

## 2018-10-26 NOTE — Discharge Instructions (Addendum)
Your EKG and Chest Xray are normal today, which is reassuring.  We will check your blood levels to check for anemia. Will notify you if any changes to treatment are needed.   I will place order for ultrasound of the breast to further evaluate your symptoms as well. You will get a phone call to get this scheduled.  Omeprazole daily to prevent any ulceration or gi bleeding.  Please continue to follow with your primary care provider as scheduled for further evaluation and treatment.  If develop worsening of pain, shortness of breath , nausea, sweating or otherwise worsening please go to the ER.

## 2018-10-26 NOTE — ED Triage Notes (Signed)
Pt c/o center chest pain (stabbing to lt breast bone) for 3 days and having some night sweats and dizziness for past few weeks

## 2018-10-27 LAB — CBC WITH DIFFERENTIAL/PLATELET
Basophils Absolute: 0 10*3/uL (ref 0.0–0.2)
Basos: 1 %
EOS (ABSOLUTE): 0.3 10*3/uL (ref 0.0–0.4)
Eos: 4 %
Hematocrit: 29 % — ABNORMAL LOW (ref 34.0–46.6)
Hemoglobin: 8.4 g/dL — ABNORMAL LOW (ref 11.1–15.9)
Immature Grans (Abs): 0 10*3/uL (ref 0.0–0.1)
Immature Granulocytes: 1 %
Lymphocytes Absolute: 1.1 10*3/uL (ref 0.7–3.1)
Lymphs: 19 %
MCH: 19.2 pg — ABNORMAL LOW (ref 26.6–33.0)
MCHC: 29 g/dL — ABNORMAL LOW (ref 31.5–35.7)
MCV: 66 fL — ABNORMAL LOW (ref 79–97)
Monocytes Absolute: 0.4 10*3/uL (ref 0.1–0.9)
Monocytes: 7 %
Neutrophils Absolute: 4.1 10*3/uL (ref 1.4–7.0)
Neutrophils: 68 %
Platelets: 379 10*3/uL (ref 150–450)
RBC: 4.37 x10E6/uL (ref 3.77–5.28)
RDW: 17.5 % — ABNORMAL HIGH (ref 11.7–15.4)
WBC: 6 10*3/uL (ref 3.4–10.8)

## 2018-10-31 ENCOUNTER — Other Ambulatory Visit: Payer: Self-pay | Admitting: Emergency Medicine

## 2018-10-31 DIAGNOSIS — N644 Mastodynia: Secondary | ICD-10-CM

## 2019-02-11 ENCOUNTER — Other Ambulatory Visit: Payer: Self-pay

## 2019-02-11 ENCOUNTER — Encounter: Payer: Self-pay | Admitting: Family Medicine

## 2019-02-11 ENCOUNTER — Ambulatory Visit (INDEPENDENT_AMBULATORY_CARE_PROVIDER_SITE_OTHER): Payer: Managed Care, Other (non HMO) | Admitting: Family Medicine

## 2019-02-11 ENCOUNTER — Ambulatory Visit: Payer: Self-pay

## 2019-02-11 VITALS — BP 140/88 | HR 73 | Ht 62.0 in | Wt 203.0 lb

## 2019-02-11 DIAGNOSIS — M25561 Pain in right knee: Secondary | ICD-10-CM | POA: Diagnosis not present

## 2019-02-11 DIAGNOSIS — G8929 Other chronic pain: Secondary | ICD-10-CM | POA: Diagnosis not present

## 2019-02-11 DIAGNOSIS — M25562 Pain in left knee: Secondary | ICD-10-CM | POA: Diagnosis not present

## 2019-02-11 NOTE — Patient Instructions (Addendum)
Thank you for coming in today.  Ok to take tylenol. Recommend Tylenol arthrits (extended release 650). Take 1-2 every 8 hours for pain as needed.  Ok to take in addition ibuprofen (600-800) or aleve (1-2 2x daily).  Ok to recheck as needed.  I will be at [place].   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   You had an injection today.  Things to be aware of after injection are listed below: . You may experience no significant improvement or even a slight worsening in your symptoms during the first 24 to 48 hours.  After that we expect your symptoms to improve gradually over the next 2 weeks for the medicine to have its maximal effect.  You should continue to have improvement out to 6 weeks after your injection. . Dr. Paulla Fore recommends icing the site of the injection for 20 minutes  1-2 times the day of your injection . You may shower but no swimming, tub bath or Jacuzzi for 24 hours. . If your bandage falls off this does not need to be replaced.  It is appropriate to remove the bandage after 4 hours. . You may resume light activities as tolerated unless otherwise directed per Dr. Paulla Fore during your visit  POSSIBLE STEROID SIDE EFFECTS:  Side effects from injectable steroids tend to be less than when taken orally however you may experience some of the symptoms listed below.  If experienced these should only last for a short period of time. Change in menstrual flow  Edema (swelling)  Increased appetite Skin flushing (redness)  Skin rash/acne  Thrush (oral) Yeast vaginitis    Increased sweating  Depression Increased blood glucose levels Cramping and leg/calf  Euphoria (feeling happy)  POSSIBLE PROCEDURE SIDE EFFECTS: The side effects of the injection are usually fairly minimal however if you may experience some of the following side effects that are usually self-limited and will is off on their own.  If you are concerned please feel free to call the office with  questions:  Increased numbness or tingling  Nausea or vomiting  Swelling or bruising at the injection site   Please call our office if if you experience any of the following symptoms over the next week as these can be signs of infection:   Fever greater than 100.37F  Significant swelling at the injection site  Significant redness or drainage from the injection site  If after 2 weeks you are continuing to have worsening symptoms please call our office to discuss what the next appropriate actions should be including the potential for a return office visit or other diagnostic testing.

## 2019-02-11 NOTE — Progress Notes (Addendum)
Subjective:    CC: B knee pain  I, Brittany Cherry, LAT, ATC, am serving as scribe for Dr. Lynne Leader.  HPI: Pt is a 48 y/o female c/o B knee pain.  Pt saw Dr. Raeford Razor on 06/22/18 and had B knee injections at that point.  Pt reports chronic knee pain x several years.  She is employed as a Marine scientist at [employer].  Pt reports pain and swelling in B knees and has mechanical symptoms in both knees.  Pt notes that her knee pain is the worst at night.  She notes prior L knee arthroscopic knee surgery.  Pt states that alleviating factors include knee injections and icing her B knees.  She cannot take anti-inflammatories and reports that topical anti-inflammatory gels tend to cause skin reactions.  She notes that she is had steroid injections to her knees bilaterally in the past most recently in March with Dr. Raeford Razor as above.  These injections last somewhere between 5 to 6 months.   Past medical history, Surgical history, Family history not pertinant except as noted below, Social history, Allergies, and medications have been entered into the medical record, reviewed, and no changes needed.   Review of Systems: No headache, visual changes, nausea, vomiting, diarrhea, constipation, dizziness, abdominal pain, skin rash, fevers, chills, night sweats, weight loss, swollen lymph nodes, body aches, joint swelling, muscle aches, chest pain, shortness of breath, mood changes, visual or auditory hallucinations.   Objective:    Vitals:   02/11/19 0951  BP: 140/88  Pulse: 73  SpO2: 99%   General: Well Developed, well nourished, and in no acute distress.  Neuro/Psych: Alert and oriented x3, extra-ocular muscles intact, able to move all 4 extremities, sensation grossly intact. Skin: Warm and dry, no rashes noted.  Respiratory: Not using accessory muscles, speaking in full sentences, trachea midline.  Cardiovascular: Pulses palpable, no extremity edema. Abdomen: Does not appear distended. MSK:  Right  knee: Trace effusion no deformity no skin erythema. Range of motion 0-120 degrees with mild crepitations. Stable ligamentous exam.  Left knee: Trace effusion no deformity no skin erythema. Range of motion 0-120 degrees with crepitation. Stable ligamentous exam.  Normal gait.  Lab and Radiology Results Procedure: Real-time Ultrasound Guided Injection of right knee Device: GE Logiq E   Images permanently stored and available for review in the ultrasound unit. Verbal informed consent obtained.  Discussed risks and benefits of procedure. Warned about infection bleeding damage to structures skin hypopigmentation and fat atrophy among others. Patient expresses understanding and agreement Time-out conducted.   Noted no overlying erythema, induration, or other signs of local infection.   Skin prepped in a sterile fashion.   Local anesthesia: Topical Ethyl chloride.   With sterile technique and under real time ultrasound guidance.  40 mg of Kenalog and 4 mL of Marcaine injected easily.   Completed without difficulty   Pain immediately resolved suggesting accurate placement of the medication.   Advised to call if fevers/chills, erythema, induration, drainage, or persistent bleeding.   Images permanently stored and available for review in the ultrasound unit.  Impression: Technically successful ultrasound guided injection.  Procedure: Real-time Ultrasound Guided Injection of left knee Device: GE Logiq E   Images permanently stored and available for review in the ultrasound unit. Verbal informed consent obtained.  Discussed risks and benefits of procedure. Warned about infection bleeding damage to structures skin hypopigmentation and fat atrophy among others. Patient expresses understanding and agreement Time-out conducted.   Noted no overlying  erythema, induration, or other signs of local infection.   Skin prepped in a sterile fashion.   Local anesthesia: Topical Ethyl chloride.   With  sterile technique and under real time ultrasound guidance:  40 mg of Kenalog and 4 mL of Marcaine injected easily.   Completed without difficulty   Pain immediately resolved suggesting accurate placement of the medication.   Advised to call if fevers/chills, erythema, induration, drainage, or persistent bleeding.   Images permanently stored and available for review in the ultrasound unit.  Impression: Technically successful ultrasound guided injection.     Impression and Recommendations:    Assessment and Plan: 48 y.o. female with bilateral knee pain due to DJD.  Discussed treatment strategies including weight loss and quad strengthening.  Patient intolerant to topical NSAIDs.  Recommend Tylenol arthritis regularly with occasional intermittent oral NSAIDs. Additionally will proceed with steroid injections bilateral knees.  So far these have been lasting 5 to 6 months.  Discussed that eventually these will stop working and she will have to proceed with either total knee replacement or trial of hyaluronic acid or even PRP injection.  Recheck back as needed.Marland Kitchen  PDMP not reviewed this encounter. Orders Placed This Encounter  Procedures  . [place] LIMITED JOINT SPACE STRUCTURES LOW BILAT    Order Specific Question:   Reason for Exam (SYMPTOM  OR DIAGNOSIS REQUIRED)    Answer:   B knee pain    Order Specific Question:   Preferred imaging location?    Answer:   [facility]   No orders of the defined types were placed in this encounter.   Discussed warning signs or symptoms. Please see discharge instructions. Patient expresses understanding.  The above documentation has been reviewed and is accurate and complete Lynne Leader

## 2019-07-11 ENCOUNTER — Other Ambulatory Visit: Payer: Self-pay | Admitting: Family Medicine

## 2019-07-11 DIAGNOSIS — R109 Unspecified abdominal pain: Secondary | ICD-10-CM

## 2019-07-11 DIAGNOSIS — R1013 Epigastric pain: Secondary | ICD-10-CM

## 2019-07-11 DIAGNOSIS — R110 Nausea: Secondary | ICD-10-CM

## 2019-07-17 ENCOUNTER — Ambulatory Visit
Admission: RE | Admit: 2019-07-17 | Discharge: 2019-07-17 | Disposition: A | Discharge status: Home / Self Care | Payer: Managed Care, Other (non HMO) | Source: Ambulatory Visit | Attending: Family Medicine | Admitting: Family Medicine

## 2019-07-17 DIAGNOSIS — R1013 Epigastric pain: Secondary | ICD-10-CM

## 2019-07-17 DIAGNOSIS — R109 Unspecified abdominal pain: Secondary | ICD-10-CM

## 2019-07-17 DIAGNOSIS — R110 Nausea: Secondary | ICD-10-CM

## 2020-03-11 ENCOUNTER — Ambulatory Visit: Payer: Self-pay | Admitting: Surgery

## 2020-03-30 NOTE — Progress Notes (Signed)
[place], [place] - [address] [place] [address] Phone: [phone] Fax: [phone]  [facility] [place], [place] [address] Phone: [phone] Fax: [phone]      Your procedure is scheduled on Wednesday, December 29th.  Report to [facility] Main Entrance "A" at 11:00 A.M., and check in at the Admitting office.  Call this number if you have problems the morning of surgery:  [phone]  Call [phone] if you have any questions prior to your surgery date Monday-Friday 8am-4pm    Remember:  Do not eat or drink after midnight the night before your surgery     Take these medicines the morning of surgery with A SIP OF WATER   Omeprazole (Prilosec)  As of today, STOP taking any Aspirin (unless otherwise instructed by your surgeon) Aleve, Naproxen, Ibuprofen, Motrin, Advil, Goody's, BC's, all herbal medications, fish oil, and all vitamins.                      Do not wear jewelry, make up, or nail polish            Do not wear lotions, powders, perfumes, or deodorant.            Do not shave 48 hours prior to surgery.  .            Do not bring valuables to the hospital.            [facility] is not responsible for any belongings or valuables.  Do NOT Smoke (Tobacco/Vaping) or drink Alcohol 24 hours prior to your procedure If you use a CPAP at night, you may bring all equipment for your overnight stay.   Contacts, glasses, dentures or bridgework may not be worn into surgery.      For patients admitted to the hospital, discharge time will be determined by your treatment team.   Patients discharged the day of surgery will not be allowed to drive home, and someone needs to stay with them for 24 hours.    Special instructions:   [place]- Preparing For Surgery  Before surgery, you can play an important role. Because skin is not  sterile, your skin needs to be as free of germs as possible. You can reduce the number of germs on your skin by washing with CHG (chlorahexidine gluconate) Soap before surgery.  CHG is an antiseptic cleaner which kills germs and bonds with the skin to continue killing germs even after washing.    Oral Hygiene is also important to reduce your risk of infection.  Remember - BRUSH YOUR TEETH THE MORNING OF SURGERY WITH YOUR REGULAR TOOTHPASTE  Please do not use if you have an allergy to CHG or antibacterial soaps. If your skin becomes reddened/irritated stop using the CHG.  Do not shave (including legs and underarms) for at least 48 hours prior to first CHG shower. It is OK to shave your face.  Please follow these instructions carefully.   1. Shower the NIGHT BEFORE SURGERY and the MORNING OF SURGERY with CHG Soap.   2. If you chose to wash your hair, wash your hair first as usual with your normal shampoo.  3. After you shampoo, rinse your hair and body thoroughly to remove the shampoo.  4. Use CHG as you would any other liquid soap. You can apply CHG directly to the skin and wash  gently with a scrungie or a clean washcloth.   5. Apply the CHG Soap to your body ONLY FROM THE NECK DOWN.  Do not use on open wounds or open sores. Avoid contact with your eyes, ears, mouth and genitals (private parts). Wash Face and genitals (private parts)  with your normal soap.   6. Wash thoroughly, paying special attention to the area where your surgery will be performed.  7. Thoroughly rinse your body with warm water from the neck down.  8. DO NOT shower/wash with your normal soap after using and rinsing off the CHG Soap.  9. Pat yourself dry with a CLEAN TOWEL.  10. Wear CLEAN PAJAMAS to bed the night before surgery  11. Place CLEAN SHEETS on your bed the night of your first shower and DO NOT SLEEP WITH PETS.   Day of Surgery: Wear Clean/Comfortable clothing the morning of surgery Do not apply any  deodorants/lotions.   Remember to brush your teeth WITH YOUR REGULAR TOOTHPASTE.   Please read over the following fact sheets that you were given.

## 2020-03-31 ENCOUNTER — Encounter (HOSPITAL_COMMUNITY): Payer: Self-pay

## 2020-03-31 ENCOUNTER — Encounter (HOSPITAL_COMMUNITY)
Admission: RE | Admit: 2020-03-31 | Discharge: 2020-03-31 | Disposition: A | Discharge status: Home / Self Care | Payer: Managed Care, Other (non HMO) | Source: Ambulatory Visit | Attending: Surgery | Admitting: Surgery

## 2020-03-31 ENCOUNTER — Other Ambulatory Visit: Payer: Self-pay

## 2020-03-31 DIAGNOSIS — Z01812 Encounter for preprocedural laboratory examination: Secondary | ICD-10-CM | POA: Insufficient documentation

## 2020-03-31 HISTORY — DX: Anemia, unspecified: D64.9

## 2020-03-31 HISTORY — DX: Headache, unspecified: R51.9

## 2020-03-31 LAB — CBC
HCT: 33.5 % — ABNORMAL LOW (ref 36.0–46.0)
Hemoglobin: 11 g/dL — ABNORMAL LOW (ref 12.0–15.0)
MCH: 24.4 pg — ABNORMAL LOW (ref 26.0–34.0)
MCHC: 32.8 g/dL (ref 30.0–36.0)
MCV: 74.4 fL — ABNORMAL LOW (ref 80.0–100.0)
Platelets: 266 10*3/uL (ref 150–400)
RBC: 4.5 MIL/uL (ref 3.87–5.11)
RDW: 14.8 % (ref 11.5–15.5)
WBC: 6.6 10*3/uL (ref 4.0–10.5)
nRBC: 0 % (ref 0.0–0.2)

## 2020-03-31 NOTE — Progress Notes (Signed)
PCP - [facility] (Dr. Rosana Berger) Cardiologist - denies  Chest xray - n/a EKG - n/a   COVID TEST- 04-06-20   Anesthesia review: n/a  Patient denies shortness of breath, fever, cough and chest pain at PAT appointment   All instructions explained to the patient, with a verbal understanding of the material. Patient agrees to go over the instructions while at home for a better understanding. Patient also instructed to self quarantine after being tested for COVID-19. The opportunity to ask questions was provided.

## 2020-04-06 ENCOUNTER — Other Ambulatory Visit (HOSPITAL_COMMUNITY)
Admission: RE | Admit: 2020-04-06 | Discharge: 2020-04-06 | Disposition: A | Discharge status: Home / Self Care | Payer: Managed Care, Other (non HMO) | Source: Ambulatory Visit | Attending: Surgery | Admitting: Surgery

## 2020-04-06 DIAGNOSIS — Z01812 Encounter for preprocedural laboratory examination: Secondary | ICD-10-CM | POA: Insufficient documentation

## 2020-04-06 DIAGNOSIS — Z20822 Contact with and (suspected) exposure to covid-19: Secondary | ICD-10-CM | POA: Diagnosis not present

## 2020-04-06 LAB — SARS CORONAVIRUS 2 (TAT 6-24 HRS): SARS Coronavirus 2: NEGATIVE

## 2020-04-08 ENCOUNTER — Encounter (HOSPITAL_COMMUNITY)
Admission: RE | Disposition: A | Discharge status: Home / Self Care | Payer: Self-pay | Source: Home / Self Care | Attending: Surgery

## 2020-04-08 ENCOUNTER — Ambulatory Visit (HOSPITAL_COMMUNITY): Payer: Managed Care, Other (non HMO) | Admitting: Anesthesiology

## 2020-04-08 ENCOUNTER — Other Ambulatory Visit: Payer: Self-pay

## 2020-04-08 ENCOUNTER — Encounter (HOSPITAL_COMMUNITY): Payer: Self-pay | Admitting: Surgery

## 2020-04-08 ENCOUNTER — Ambulatory Visit (HOSPITAL_COMMUNITY)
Admission: RE | Admit: 2020-04-08 | Discharge: 2020-04-08 | Disposition: A | Discharge status: Home / Self Care | Payer: Managed Care, Other (non HMO) | Attending: Surgery | Admitting: Surgery

## 2020-04-08 DIAGNOSIS — Z791 Long term (current) use of non-steroidal anti-inflammatories (NSAID): Secondary | ICD-10-CM | POA: Diagnosis not present

## 2020-04-08 DIAGNOSIS — K8010 Calculus of gallbladder with chronic cholecystitis without obstruction: Secondary | ICD-10-CM | POA: Insufficient documentation

## 2020-04-08 DIAGNOSIS — Z79899 Other long term (current) drug therapy: Secondary | ICD-10-CM | POA: Diagnosis not present

## 2020-04-08 DIAGNOSIS — Z9109 Other allergy status, other than to drugs and biological substances: Secondary | ICD-10-CM | POA: Diagnosis not present

## 2020-04-08 DIAGNOSIS — Z87891 Personal history of nicotine dependence: Secondary | ICD-10-CM | POA: Insufficient documentation

## 2020-04-08 DIAGNOSIS — K8020 Calculus of gallbladder without cholecystitis without obstruction: Secondary | ICD-10-CM | POA: Diagnosis present

## 2020-04-08 HISTORY — PX: CHOLECYSTECTOMY: SHX55

## 2020-04-08 LAB — POCT PREGNANCY, URINE: Preg Test, Ur: NEGATIVE

## 2020-04-08 SURGERY — LAPAROSCOPIC CHOLECYSTECTOMY
Anesthesia: General | Site: Abdomen

## 2020-04-08 MED ORDER — SODIUM CHLORIDE 0.9 % IR SOLN
Status: DC | PRN
  Administered 2020-04-08: 1000 mL

## 2020-04-08 MED ORDER — LIDOCAINE 2% (20 MG/ML) 5 ML SYRINGE
INTRAMUSCULAR | Status: DC | PRN
  Administered 2020-04-08: 40 mg via INTRAVENOUS

## 2020-04-08 MED ORDER — DEXAMETHASONE SODIUM PHOSPHATE 10 MG/ML IJ SOLN
INTRAMUSCULAR | Status: AC
  Filled 2020-04-08: qty 1

## 2020-04-08 MED ORDER — MEPERIDINE HCL 25 MG/ML IJ SOLN
INTRAMUSCULAR | Status: AC
  Administered 2020-04-08: 15:00:00 12.5 mg via INTRAVENOUS
  Filled 2020-04-08: qty 1

## 2020-04-08 MED ORDER — HYDROMORPHONE HCL 1 MG/ML IJ SOLN
INTRAMUSCULAR | Status: AC
  Administered 2020-04-08: 15:00:00 0.5 mg via INTRAVENOUS
  Filled 2020-04-08: qty 1

## 2020-04-08 MED ORDER — KETOROLAC TROMETHAMINE 30 MG/ML IJ SOLN
INTRAMUSCULAR | Status: AC
  Filled 2020-04-08: qty 1

## 2020-04-08 MED ORDER — ONDANSETRON HCL 4 MG/2ML IJ SOLN
INTRAMUSCULAR | Status: AC
  Filled 2020-04-08: qty 2

## 2020-04-08 MED ORDER — CEFAZOLIN SODIUM-DEXTROSE 2-4 GM/100ML-% IV SOLN
INTRAVENOUS | Status: AC
  Filled 2020-04-08: qty 100

## 2020-04-08 MED ORDER — LIDOCAINE 2% (20 MG/ML) 5 ML SYRINGE
INTRAMUSCULAR | Status: AC
  Filled 2020-04-08: qty 5

## 2020-04-08 MED ORDER — LACTATED RINGERS IV SOLN: INTRAVENOUS | Status: DC

## 2020-04-08 MED ORDER — SUCCINYLCHOLINE CHLORIDE 200 MG/10ML IV SOSY
PREFILLED_SYRINGE | INTRAVENOUS | Status: AC
  Filled 2020-04-08: qty 10

## 2020-04-08 MED ORDER — HYDROMORPHONE HCL 1 MG/ML IJ SOLN: 0.2500 mg | INTRAMUSCULAR | Status: DC | PRN

## 2020-04-08 MED ORDER — CHLORHEXIDINE GLUCONATE 0.12 % MT SOLN: 15.0000 mL | Freq: Once | OROMUCOSAL | Status: AC

## 2020-04-08 MED ORDER — ACETAMINOPHEN 10 MG/ML IV SOLN: 1000.0000 mg | Freq: Once | INTRAVENOUS | Status: DC

## 2020-04-08 MED ORDER — SUGAMMADEX SODIUM 200 MG/2ML IV SOLN
INTRAVENOUS | Status: DC | PRN
  Administered 2020-04-08: 200 mg via INTRAVENOUS

## 2020-04-08 MED ORDER — OXYCODONE HCL 5 MG PO TABS: 5.0000 mg | ORAL_TABLET | Freq: Four times a day (QID) | ORAL | 0 refills | Status: DC | PRN

## 2020-04-08 MED ORDER — DEXAMETHASONE SODIUM PHOSPHATE 10 MG/ML IJ SOLN
INTRAMUSCULAR | Status: DC | PRN
  Administered 2020-04-08: 5 mg via INTRAVENOUS

## 2020-04-08 MED ORDER — ROCURONIUM BROMIDE 10 MG/ML (PF) SYRINGE
PREFILLED_SYRINGE | INTRAVENOUS | Status: AC
  Filled 2020-04-08: qty 10

## 2020-04-08 MED ORDER — ORAL CARE MOUTH RINSE: 15.0000 mL | Freq: Once | OROMUCOSAL | Status: AC

## 2020-04-08 MED ORDER — CEFAZOLIN SODIUM-DEXTROSE 2-4 GM/100ML-% IV SOLN
2.0000 g | INTRAVENOUS | Status: AC
  Administered 2020-04-08: 13:00:00 2 g via INTRAVENOUS

## 2020-04-08 MED ORDER — OXYCODONE HCL 5 MG PO TABS
ORAL_TABLET | ORAL | Status: AC
  Filled 2020-04-08: qty 1

## 2020-04-08 MED ORDER — SUCCINYLCHOLINE CHLORIDE 200 MG/10ML IV SOSY
PREFILLED_SYRINGE | INTRAVENOUS | Status: DC | PRN
  Administered 2020-04-08: 90 mg via INTRAVENOUS

## 2020-04-08 MED ORDER — EPHEDRINE 5 MG/ML INJ
INTRAVENOUS | Status: AC
  Filled 2020-04-08: qty 10

## 2020-04-08 MED ORDER — 0.9 % SODIUM CHLORIDE (POUR BTL) OPTIME
TOPICAL | Status: DC | PRN
  Administered 2020-04-08: 14:00:00 1000 mL

## 2020-04-08 MED ORDER — PROPOFOL 10 MG/ML IV BOLUS
INTRAVENOUS | Status: DC | PRN
  Administered 2020-04-08: 150 mg via INTRAVENOUS

## 2020-04-08 MED ORDER — BUPIVACAINE HCL (PF) 0.25 % IJ SOLN
INTRAMUSCULAR | Status: AC
  Filled 2020-04-08: qty 30

## 2020-04-08 MED ORDER — PROMETHAZINE HCL 25 MG/ML IJ SOLN
INTRAMUSCULAR | Status: AC
  Filled 2020-04-08: qty 1

## 2020-04-08 MED ORDER — ONDANSETRON HCL 4 MG/2ML IJ SOLN
INTRAMUSCULAR | Status: DC | PRN
  Administered 2020-04-08: 4 mg via INTRAVENOUS

## 2020-04-08 MED ORDER — MIDAZOLAM HCL 2 MG/2ML IJ SOLN
INTRAMUSCULAR | Status: AC
  Filled 2020-04-08: qty 2

## 2020-04-08 MED ORDER — KETOROLAC TROMETHAMINE 30 MG/ML IJ SOLN
INTRAMUSCULAR | Status: DC | PRN
  Administered 2020-04-08: 30 mg via INTRAVENOUS

## 2020-04-08 MED ORDER — FENTANYL CITRATE (PF) 250 MCG/5ML IJ SOLN
INTRAMUSCULAR | Status: DC | PRN
  Administered 2020-04-08 (×3): 50 ug via INTRAVENOUS
  Administered 2020-04-08: 100 ug via INTRAVENOUS

## 2020-04-08 MED ORDER — FENTANYL CITRATE (PF) 250 MCG/5ML IJ SOLN
INTRAMUSCULAR | Status: AC
  Filled 2020-04-08: qty 5

## 2020-04-08 MED ORDER — MIDAZOLAM HCL 2 MG/2ML IJ SOLN
INTRAMUSCULAR | Status: DC | PRN
  Administered 2020-04-08: 2 mg via INTRAVENOUS

## 2020-04-08 MED ORDER — CHLORHEXIDINE GLUCONATE 0.12 % MT SOLN
OROMUCOSAL | Status: AC
  Administered 2020-04-08: 12:00:00 15 mL via OROMUCOSAL
  Filled 2020-04-08: qty 15

## 2020-04-08 MED ORDER — PROMETHAZINE HCL 25 MG/ML IJ SOLN
6.2500 mg | INTRAMUSCULAR | Status: DC | PRN
  Administered 2020-04-08: 15:00:00 12.5 mg via INTRAVENOUS

## 2020-04-08 MED ORDER — KETOROLAC TROMETHAMINE 30 MG/ML IJ SOLN: 30.0000 mg | Freq: Once | INTRAMUSCULAR | Status: DC | PRN

## 2020-04-08 MED ORDER — OXYCODONE HCL 5 MG PO TABS
5.0000 mg | ORAL_TABLET | Freq: Once | ORAL | Status: AC | PRN
  Administered 2020-04-08: 5 mg via ORAL

## 2020-04-08 MED ORDER — MEPERIDINE HCL 25 MG/ML IJ SOLN: 6.2500 mg | INTRAMUSCULAR | Status: DC | PRN

## 2020-04-08 MED ORDER — BUPIVACAINE-EPINEPHRINE 0.25% -1:200000 IJ SOLN
INTRAMUSCULAR | Status: DC | PRN
  Administered 2020-04-08: 21 mL

## 2020-04-08 MED ORDER — OXYCODONE HCL 5 MG/5ML PO SOLN: 5.0000 mg | Freq: Once | ORAL | Status: AC | PRN

## 2020-04-08 MED ORDER — PROPOFOL 10 MG/ML IV BOLUS
INTRAVENOUS | Status: AC
  Filled 2020-04-08: qty 20

## 2020-04-08 MED ORDER — ROCURONIUM BROMIDE 10 MG/ML (PF) SYRINGE
PREFILLED_SYRINGE | INTRAVENOUS | Status: DC | PRN
  Administered 2020-04-08: 10 mg via INTRAVENOUS
  Administered 2020-04-08: 40 mg via INTRAVENOUS

## 2020-04-08 SURGICAL SUPPLY — 43 items
APPLIER CLIP 5 13 M/L LIGAMAX5 (MISCELLANEOUS) ×3
BLADE CLIPPER SURG (BLADE) IMPLANT
CANISTER SUCT 3000ML PPV (MISCELLANEOUS) ×3 IMPLANT
CHLORAPREP W/TINT 26 (MISCELLANEOUS) ×3 IMPLANT
CLIP APPLIE 5 13 M/L LIGAMAX5 (MISCELLANEOUS) ×1 IMPLANT
COVER SURGICAL LIGHT HANDLE (MISCELLANEOUS) ×3 IMPLANT
COVER WAND RF STERILE (DRAPES) ×3 IMPLANT
DERMABOND ADVANCED (GAUZE/BANDAGES/DRESSINGS) ×2
DERMABOND ADVANCED .7 DNX12 (GAUZE/BANDAGES/DRESSINGS) ×1 IMPLANT
ELECT REM PT RETURN 9FT ADLT (ELECTROSURGICAL) ×3
ELECTRODE REM PT RTRN 9FT ADLT (ELECTROSURGICAL) ×1 IMPLANT
GLOVE BIOGEL PI IND STRL 6 (GLOVE) ×1 IMPLANT
GLOVE BIOGEL PI INDICATOR 6 (GLOVE) ×2
GLOVE BIOGEL PI MICRO 5.5 (GLOVE) ×2
GLOVE BIOGEL PI MICRO STRL 5.5 (GLOVE) ×1 IMPLANT
GOWN STRL REUS W/ TWL LRG LVL3 (GOWN DISPOSABLE) ×3 IMPLANT
GOWN STRL REUS W/TWL LRG LVL3 (GOWN DISPOSABLE) ×6
KIT BASIN OR (CUSTOM PROCEDURE TRAY) ×3 IMPLANT
KIT TURNOVER KIT B (KITS) ×3 IMPLANT
L-HOOK LAP DISP 36CM (ELECTROSURGICAL) ×3
LHOOK LAP DISP 36CM (ELECTROSURGICAL) ×1 IMPLANT
NEEDLE INSUFFLATION 14GA 120MM (NEEDLE) IMPLANT
NS IRRIG 1000ML POUR BTL (IV SOLUTION) ×3 IMPLANT
PAD ARMBOARD 7.5X6 YLW CONV (MISCELLANEOUS) ×3 IMPLANT
PENCIL BUTTON HOLSTER BLD 10FT (ELECTRODE) ×3 IMPLANT
POUCH SPECIMEN RETRIEVAL 10MM (ENDOMECHANICALS) ×3 IMPLANT
SCISSORS LAP 5X35 DISP (ENDOMECHANICALS) ×3 IMPLANT
SET IRRIG TUBING LAPAROSCOPIC (IRRIGATION / IRRIGATOR) ×3 IMPLANT
SET TUBE SMOKE EVAC HIGH FLOW (TUBING) ×3 IMPLANT
SLEEVE ENDOPATH XCEL 5M (ENDOMECHANICALS) ×6 IMPLANT
SPECIMEN JAR SMALL (MISCELLANEOUS) ×3 IMPLANT
SUT MNCRL AB 4-0 PS2 18 (SUTURE) ×6 IMPLANT
SUT VIC AB 3-0 SH 27 (SUTURE)
SUT VIC AB 3-0 SH 27XBRD (SUTURE) IMPLANT
SUT VICRYL 0 UR6 27IN ABS (SUTURE) ×6 IMPLANT
SYR CONTROL 10ML LL (SYRINGE) ×3 IMPLANT
TOWEL GREEN STERILE (TOWEL DISPOSABLE) ×3 IMPLANT
TOWEL GREEN STERILE FF (TOWEL DISPOSABLE) ×3 IMPLANT
TRAY LAPAROSCOPIC MC (CUSTOM PROCEDURE TRAY) ×3 IMPLANT
TROCAR XCEL 12X100 BLDLESS (ENDOMECHANICALS) IMPLANT
TROCAR XCEL BLUNT TIP 100MML (ENDOMECHANICALS) ×3 IMPLANT
TROCAR XCEL NON-BLD 5MMX100MML (ENDOMECHANICALS) ×3 IMPLANT
WATER STERILE IRR 1000ML POUR (IV SOLUTION) ×3 IMPLANT

## 2020-04-08 NOTE — Anesthesia Postprocedure Evaluation (Signed)
Anesthesia Post Note  Patient: [name]  Procedure(s) Performed: LAPAROSCOPIC CHOLECYSTECTOMY (N/A Abdomen)     Patient location during evaluation: PACU Anesthesia Type: General Level of consciousness: awake and alert, oriented and patient cooperative Pain management: pain level controlled Vital Signs Assessment: post-procedure vital signs reviewed and stable Respiratory status: spontaneous breathing, nonlabored ventilation and respiratory function stable Cardiovascular status: blood pressure returned to baseline and stable Postop Assessment: no apparent nausea or vomiting Anesthetic complications: no   No complications documented.  Last Vitals:  Vitals:   04/08/20 1053 04/08/20 1430  BP: 130/82 139/77  Pulse: 68 76  Resp: 17 18  Temp: 36.6 C 36.9 C  SpO2: 98% 100%    Last Pain:  Vitals:   04/08/20 1430  TempSrc:   PainSc: Asleep                 Lannie Fields

## 2020-04-08 NOTE — Anesthesia Preprocedure Evaluation (Addendum)
Anesthesia Evaluation  Patient identified by MRN, date of birth, ID band Patient awake    Reviewed: Allergy & Precautions, NPO status , Patient's Chart, lab work & pertinent test results  Airway Mallampati: II  TM Distance: >3 FB Neck ROM: Full    Dental  (+) Teeth Intact, Dental Advisory Given, Missing,    Pulmonary former smoker,  1/2 ppd x 20 years No inhalers   Pulmonary exam normal breath sounds clear to auscultation       Cardiovascular negative cardio ROS Normal cardiovascular exam Rhythm:Regular Rate:Normal     Neuro/Psych  Headaches, PSYCHIATRIC DISORDERS Depression    GI/Hepatic Neg liver ROS, GERD  Controlled,gallstones   Endo/Other  Morbid obesityObesity BMI 37  Renal/GU negative Renal ROS  negative genitourinary   Musculoskeletal  (+) Arthritis , Osteoarthritis,    Abdominal (+) + obese,   Peds  Hematology  (+) Blood dyscrasia, anemia , hct 33.5   Anesthesia Other Findings   Reproductive/Obstetrics S/p TL 2015                            Anesthesia Physical Anesthesia Plan  ASA: II  Anesthesia Plan: General   Post-op Pain Management:    Induction: Intravenous  PONV Risk Score and Plan: 4 or greater and Ondansetron, Dexamethasone, Midazolam and Treatment may vary due to age or medical condition  Airway Management Planned: Oral ETT  Additional Equipment: None  Intra-op Plan:   Post-operative Plan: Extubation in OR  Informed Consent: I have reviewed the patients History and Physical, chart, labs and discussed the procedure including the risks, benefits and alternatives for the proposed anesthesia with the patient or authorized representative who has indicated his/her understanding and acceptance.     Dental advisory given  Plan Discussed with: CRNA  Anesthesia Plan Comments:         Anesthesia Quick Evaluation

## 2020-04-08 NOTE — H&P (Signed)
Brittany Cherry 1970/09/19  [id number].    HPI:  Brittany Cherry is a 49 yo female who has been having intermittent epigastric pain after meals for the last few months. A RUQ [place] in April showed cholelithiasis without cholecystitis. She was seen in clinic on 10/29 and had a mild transaminitis at that time, which subsequently resolved on repeat labs. She presents today for cholecystectomy. She is still having pain after meals.  ROS: Review of Systems  Constitutional: Negative for chills and fever.  Respiratory: Negative for shortness of breath.   Cardiovascular: Negative for chest pain.  Gastrointestinal: Positive for abdominal pain.  Neurological: Negative for focal weakness.    History reviewed. No pertinent family history.  Past Medical History:  Diagnosis Date  . AMA (advanced maternal age) multigravida 35+   . Anemia   . Depression   . GERD (gastroesophageal reflux disease)   . Headache   . Hyperemesis gravidarum     Past Surgical History:  Procedure Laterality Date  . COLONOSCOPY WITH PROPOFOL N/A 02/27/2017   Procedure: COLONOSCOPY WITH PROPOFOL;  Surgeon: Kerin Salen, MD;  Location: [facility] ENDOSCOPY;  Service: Gastroenterology;  Laterality: N/A;  . DILATION AND CURETTAGE OF UTERUS    . ESOPHAGOGASTRODUODENOSCOPY (EGD) WITH PROPOFOL N/A 02/27/2017   Procedure: ESOPHAGOGASTRODUODENOSCOPY (EGD) WITH PROPOFOL;  Surgeon: Kerin Salen, MD;  Location: [facility] ENDOSCOPY;  Service: Gastroenterology;  Laterality: N/A;  . KNEE ARTHROSCOPY Bilateral   . TUBAL LIGATION Bilateral 08/18/2013   Procedure: POST PARTUM TUBAL LIGATION;  Surgeon: Lazaro Arms, MD;  Location: [facility] ORS;  Service: Gynecology;  Laterality: Bilateral;  . WISDOM TOOTH EXTRACTION      Social History:  reports that she quit smoking about 3 years ago. She has never used smokeless tobacco. She reports current alcohol use. She reports that she does not use drugs.  Allergies:  Allergies  Allergen Reactions  . Ativan  [Lorazepam] Itching    Feet, Hands, Face    Medications Prior to Admission  Medication Sig Dispense Refill  . Aspirin-Salicylamide-Caffeine (BC HEADACHE PO) Take 1 packet by mouth daily as needed (pain).    Marland Kitchen omeprazole (PRILOSEC) 20 MG capsule Take 1 capsule (20 mg total) by mouth daily. 30 capsule 0  . pantoprazole (PROTONIX) 40 MG tablet Take 40 mg by mouth daily.    . traZODone (DESYREL) 50 MG tablet Take 50 mg by mouth at bedtime as needed for sleep.    . ferrous gluconate (FERGON) 324 MG tablet Take 1 tablet (324 mg total) daily with breakfast by mouth. 30 tablet 3  . naproxen (NAPROSYN) 500 MG tablet Take 1 tablet (500 mg total) by mouth 2 (two) times daily. 30 tablet 0     Physical Exam: Blood pressure 130/82, pulse 68, temperature 97.9 F (36.6 C), temperature source Oral, resp. rate 17, height 5\' 2"  (1.575 m), weight 91.6 kg, last menstrual period 03/26/2020, SpO2 98 %. General: resting comfortably, appears stated age, no apparent distress Neurological: alert and oriented, no focal deficits, cranial nerves grossly in tact HEENT: normocephalic, atraumatic,  no scleral icterus CV: extremities warm and well-perfused Respiratory: normal work of breathing,symmetric chest wall expansion Abdomen: soft, nondistended, well-healed umbilical surgical scar Extremities: warm and well-perfused, no deformities, moving all extremities spontaneously Psychiatric: normal mood and affect Skin: warm and dry, no jaundice, no rashes or lesions   Results for orders placed or performed during the hospital encounter of 04/08/20 (from the past 48 hour(s))  Pregnancy, urine POC     Status:  None   Collection Time: 04/08/20 11:22 AM  Result Value Ref Range   Preg Test, Ur NEGATIVE NEGATIVE    Comment:        THE SENSITIVITY OF THIS METHODOLOGY IS >24 mIU/mL    No results found.    Assessment/Plan 49 yo female with symptomatic cholelithiasis, presenting for lap cholecystectomy. RUQ [place] also  showed fatty liver and she had a transient elevation in LFTs. If liver appears abnormal intraop will send a biopsy. Procedure discussed with the patient and she agrees to proceed. Plan for discharge home from PACU.  Michaelle Birks, [facility] General, Hepatobiliary and Pancreatic Surgery 04/08/20 12:42 PM

## 2020-04-08 NOTE — Op Note (Signed)
Date: 04/08/20  Patient: Brittany Cherry MRN: [id number]  Preoperative Diagnosis: Symptomatic cholelithiasis Postoperative Diagnosis: Same  Procedure: Laparoscopic cholecystectomy  Surgeon: Sophronia Simas, MD Assistant: Myrtie Soman, RNFA  EBL: Minimal  Anesthesia: General endotracheal  Specimens: Gallbladder  Indications: [name] is a 49 yo female who presented with several months of upper abdominal pain after eating. RUQ US showed cholelithiasis without evidence of cholecystitis. After an extensive discussion of the risks and benefits of surgery, the patient agreed to proceed with cholecystectomy.  Findings: Cholelithiasis without evidence of acute cholecystitis. Small area of adhesions between the liver and anterior abdominal wall at segments 5/6. No evidence of cirrhosis.  Procedure details: Informed consent was obtained in the preoperative area prior to the procedure. The patient was brought to the operating room and placed on the table in the supine position. General anesthesia was induced and appropriate lines and drains were placed for intraoperative monitoring. Perioperative antibiotics were administered per SCIP guidelines. The abdomen was prepped and draped in the usual sterile fashion. A pre-procedure timeout was taken verifying patient identity, surgical site and procedure to be performed.  A small infraumbilical skin incision was made, the subcutaneous tissue was divided with cautery, and the umbilical stalk was grasped and elevated. The fascia was incised and the peritoneal cavity was directly visualized. A 28mm Hassan trocar was placed. The peritoneal cavity was inspected with no evidence of visceral or vascular injury. Three 54mm ports were placed in the right subcostal margin, all under direct visualization. There were some isolated thin filmy adhesions between the the right liver and anterior abdominal wall at segment 5/6, which inhibited retraction. These were  taken down with cautery. No underlying liver abnormalities were visualized, and the liver was otherwise grossly normal in appearance. The fundus of the gallbladder was grasped and retracted cephalad. The infundibulum was retracted laterally. The cystic triangle was dissected out using cautery and blunt dissection, and the critical view of safety was obtained. The cystic duct and cystic artery were clipped and ligated. The gallbladder was taken off the liver using cautery. The specimen was placed in an endocatch bag and removed. The surgical site was irrigated with saline until the effluent was clear. There were numerous small stones in the gallbladder, some of which spilled out during the dissection. These were removed with a stone grasper. Hemostasis was achieved in the gallbladder fossa using cautery. The cystic duct and artery stumps were visually inspected and there was no evidence of bile leak or bleeding. The ports were removed under direct visualization and the abdomen was desufflated. The umbilical port site fascia was closed with a 0 vicryl suture. The skin at all port sites was closed with 4-0 monocryl subcuticular suture. Dermabond was applied.  The patient tolerated the procedure with no apparent complications. All counts were correct x2 at the end of the procedure. The patient was extubated and taken to PACU in stable condition.  Sophronia Simas, MD 04/08/20 2:34 PM

## 2020-04-08 NOTE — Discharge Instructions (Signed)
[facility] DISCHARGE INSTRUCTIONS  Activity . No heavy lifting greater than 10 pounds for 4 weeks after surgery. Marland Kitchen Ok to shower in 48 hours after surgery, but do not bathe or submerge incisions underwater. . Do not drive while taking narcotic pain medication.  Wound Care . Your incisions are covered with skin glue called Dermabond. This will peel off on its own over time. . You may shower and allow warm soapy water to run over your incisions, 48 hours after surgery. Gently pat dry. . Do not submerge your incision underwater. . Monitor your incision for any new redness, tenderness, or drainage.  When to Call us: Marland Kitchen Fever greater than 100.5 . New redness, drainage, or swelling at incision site . Severe pain, nausea, or vomiting . Jaundice (yellowing of the whites of the eyes or skin)  Follow-up You have an appointment scheduled with Dr. Freida Busman on 04/29/20 at 2:45pm. This will be at the [facility] office at [address], [address], [place], [place]. Please arrive at least 15 minutes prior to your scheduled appointment time.  For questions or concerns, please call the office at [phone].

## 2020-04-08 NOTE — Anesthesia Procedure Notes (Signed)
Procedure Name: Intubation Date/Time: 04/08/2020 1:09 PM Performed by: Janace Litten, CRNA Pre-anesthesia Checklist: Patient identified, Emergency Drugs available, Suction available and Patient being monitored Patient Re-evaluated:Patient Re-evaluated prior to induction Oxygen Delivery Method: Circle System Utilized Preoxygenation: Pre-oxygenation with 100% oxygen Induction Type: IV induction and Rapid sequence Ventilation: Mask ventilation without difficulty Laryngoscope Size: Mac and 3 Grade View: Grade I Tube type: Oral Tube size: 7.0 mm Number of attempts: 1 Airway Equipment and Method: Stylet Placement Confirmation: ETT inserted through vocal cords under direct vision,  positive ETCO2 and breath sounds checked- equal and bilateral Secured at: 21 cm Tube secured with: Tape Dental Injury: Teeth and Oropharynx as per pre-operative assessment

## 2020-04-08 NOTE — Transfer of Care (Signed)
Immediate Anesthesia Transfer of Care Note  Patient: Brittany Cherry  Procedure(s) Performed: LAPAROSCOPIC CHOLECYSTECTOMY (N/A Abdomen)  Patient Location: PACU  Anesthesia Type:General  Level of Consciousness: drowsy, patient cooperative and responds to stimulation  Airway & Oxygen Therapy: Patient Spontanous Breathing  Post-op Assessment: Report given to RN and Post -op Vital signs reviewed and stable  Post vital signs: Reviewed and stable  Last Vitals:  Vitals Value Taken Time  BP 139/77 04/08/20 1431  Temp    Pulse 76 04/08/20 1431  Resp 18 04/08/20 1431  SpO2 100 % 04/08/20 1431  Vitals shown include unvalidated device data.  Last Pain:  Vitals:   04/08/20 1154  TempSrc:   PainSc: 0-No pain      Patients Stated Pain Goal: 3 (04/08/20 1154)  Complications: No complications documented.

## 2020-04-09 ENCOUNTER — Encounter (HOSPITAL_COMMUNITY): Payer: Self-pay | Admitting: Surgery

## 2020-04-09 LAB — SURGICAL PATHOLOGY

## 2020-06-16 ENCOUNTER — Other Ambulatory Visit: Payer: Self-pay

## 2020-06-16 ENCOUNTER — Encounter (HOSPITAL_COMMUNITY): Payer: Self-pay | Admitting: Emergency Medicine

## 2020-06-16 ENCOUNTER — Ambulatory Visit (HOSPITAL_COMMUNITY)
Admission: EM | Admit: 2020-06-16 | Discharge: 2020-06-16 | Disposition: A | Discharge status: Home / Self Care | Payer: Managed Care, Other (non HMO) | Attending: Student | Admitting: Student

## 2020-06-16 DIAGNOSIS — H04129 Dry eye syndrome of unspecified lacrimal gland: Secondary | ICD-10-CM

## 2020-06-16 DIAGNOSIS — R0789 Other chest pain: Secondary | ICD-10-CM

## 2020-06-16 DIAGNOSIS — R030 Elevated blood-pressure reading, without diagnosis of hypertension: Secondary | ICD-10-CM

## 2020-06-16 DIAGNOSIS — H1132 Conjunctival hemorrhage, left eye: Secondary | ICD-10-CM

## 2020-06-16 DIAGNOSIS — R079 Chest pain, unspecified: Secondary | ICD-10-CM

## 2020-06-16 MED ORDER — EYE WASH OPHTH SOLN
OPHTHALMIC | Status: AC
  Filled 2020-06-16: qty 118

## 2020-06-16 MED ORDER — FLUORESCEIN SODIUM 1 MG OP STRP
ORAL_STRIP | OPHTHALMIC | Status: AC
  Filled 2020-06-16: qty 1

## 2020-06-16 MED ORDER — POLYMYXIN B-TRIMETHOPRIM 10000-0.1 UNIT/ML-% OP SOLN: 1.0000 [drp] | OPHTHALMIC | 0 refills | Status: AC

## 2020-06-16 MED ORDER — TETRACAINE HCL 0.5 % OP SOLN
OPHTHALMIC | Status: AC
  Filled 2020-06-16: qty 4

## 2020-06-16 NOTE — ED Triage Notes (Signed)
Patient c/o LFT eye pain and redness x 3 day.   Patient denies any fall or trauma to eye.   Patient denies any changes to vision.   Patient states " on Saturday it felt like something was in my eye and I started rubbing it".   Patient denies any eye drainage.   Patient tried using an eye patch, warm compress, and eye drops with no relief of symptoms.

## 2020-06-16 NOTE — Discharge Instructions (Addendum)
-  Please contact your eye doctor and follow-up in the next 1 to 2 days. -If you develop new symptoms like vision changes, pain with eye movement, vision loss-head straight to the ER. -If you continue to experience chest pain, or your chest pain gets worse, pain in left arm, dizziness, shortness of breath-head straight to the ER.

## 2020-06-16 NOTE — ED Provider Notes (Signed)
[place]    CSN: [id number] Arrival date & time: 06/16/20  0802      History   Chief Complaint Chief Complaint  Patient presents with  . Eye Problem    HPI Brittany Cherry is a 50 y.o. female presenting with eye redness and chest pain. History anemia, AMA, GERD, headache, knee OA, s/p tubal ligation, left-sided chest pain.  -States she has had left eye irritation and redness for 3 days.  Initially felt something was in her eye, but this has resolved on its own.  She flushed the eye out on the day that this happened.  She denies trauma, falls.  Denies vision changes, eye drainage.  Wears glasses but not contacts.  Has been using eye patch and warm compresses with no relief.  States she has a history of dry eyes and uses lubricating eyedrops for this at baseline.  Denies photophobia, foreign body sensation, eye crusting in the morning, eye pain, eye pain with movement, injury to eye, vision changes, double vision, excessive tearing, burning eyes -At the end of this visit, patient also mentions that she is currently having left-sided chest pressure. Seems to be relieved by burping.  States that she has had similar issue in the past due to anxiety.  States that her blood pressure is high for her today, but she does not have a diagnosis of hypertension.  Monitor her blood pressure at work last night and this was 120s over 80s.  Denies dizziness, left arm pain, left jaw pain, nausea.  States that burping seems to relieve the pain somewhat.   HPI  Past Medical History:  Diagnosis Date  . AMA (advanced maternal age) multigravida 52+   . Anemia   . Depression   . GERD (gastroesophageal reflux disease)   . Headache   . Hyperemesis gravidarum     Patient Active Problem List   Diagnosis Date Noted  . Primary osteoarthritis of both knees 06/22/2018  . Acute anemia 02/26/2017  . Chronic pain of both knees 06/07/2016  . NSVD (normal spontaneous vaginal delivery) 08/20/2013  .  S/P tubal ligation 08/20/2013  . PROM (premature rupture of membranes) 08/18/2013  . Elderly multigravida with antepartum condition or complication 03/00/9233    Past Surgical History:  Procedure Laterality Date  . CHOLECYSTECTOMY N/A 04/08/2020   Procedure: LAPAROSCOPIC CHOLECYSTECTOMY;  Surgeon: Dwan Bolt, MD;  Location: [facility];  Service: General;  Laterality: N/A;  . COLONOSCOPY WITH PROPOFOL N/A 02/27/2017   Procedure: COLONOSCOPY WITH PROPOFOL;  Surgeon: Ronnette Juniper, MD;  Location: [facility];  Service: Gastroenterology;  Laterality: N/A;  . DILATION AND CURETTAGE OF UTERUS    . ESOPHAGOGASTRODUODENOSCOPY (EGD) WITH PROPOFOL N/A 02/27/2017   Procedure: ESOPHAGOGASTRODUODENOSCOPY (EGD) WITH PROPOFOL;  Surgeon: Ronnette Juniper, MD;  Location: [facility];  Service: Gastroenterology;  Laterality: N/A;  . KNEE ARTHROSCOPY Bilateral   . TUBAL LIGATION Bilateral 08/18/2013   Procedure: POST PARTUM TUBAL LIGATION;  Surgeon: Florian Buff, MD;  Location: [facility];  Service: Gynecology;  Laterality: Bilateral;  . WISDOM TOOTH EXTRACTION      OB History    Gravida  8   Para  4   Term  4   Preterm      AB  4   Living  4     SAB  2   IAB  2   Ectopic      Multiple      Live Births  4  Home Medications    Prior to Admission medications   Medication Sig Start Date End Date Taking? Authorizing Provider  trimethoprim-polymyxin b (POLYTRIM) ophthalmic solution Place 1 drop into the left eye every 4 (four) hours for 7 days. 06/16/20 06/23/20 Yes Hazel Sams, PA-C  Aspirin-Salicylamide-Caffeine (BC HEADACHE PO) Take 1 packet by mouth daily as needed (pain).    [provider]  ferrous gluconate (FERGON) 324 MG tablet Take 1 tablet (324 mg total) daily with breakfast by mouth. 02/27/17   Burgess Estelle, MD  naproxen (NAPROSYN) 500 MG tablet Take 1 tablet (500 mg total) by mouth 2 (two) times daily. 10/26/18   Zigmund Gottron, NP  omeprazole (PRILOSEC)  20 MG capsule Take 1 capsule (20 mg total) by mouth daily. 10/26/18   Zigmund Gottron, NP  oxyCODONE (OXY IR/ROXICODONE) 5 MG immediate release tablet Take 1 tablet (5 mg total) by mouth every 6 (six) hours as needed for severe pain. 04/08/20   Dwan Bolt, MD  pantoprazole (PROTONIX) 40 MG tablet Take 40 mg by mouth daily.    [provider]  traZODone (DESYREL) 50 MG tablet Take 50 mg by mouth at bedtime as needed for sleep.    [provider]    Family History History reviewed. No pertinent family history.  Social History Social History   Tobacco Use  . Smoking status: Former Smoker    Quit date: 09/09/2016    Years since quitting: 3.7  . Smokeless tobacco: Never Used  Vaping Use  . Vaping Use: Never used  Substance Use Topics  . Alcohol use: Yes    Comment: occassionally  . Drug use: No     Allergies   Ativan [lorazepam]   Review of Systems Review of Systems  Constitutional: Negative for chills and fever.  HENT: Negative for congestion, ear pain and sinus pain.   Eyes: Positive for redness. Negative for photophobia, pain, discharge, itching and visual disturbance.  Respiratory: Positive for chest tightness. Negative for cough and shortness of breath.   Cardiovascular: Positive for chest pain. Negative for palpitations and leg swelling.  Neurological: Negative for dizziness, tremors, weakness, light-headedness, numbness and headaches.  All other systems reviewed and are negative.    Physical Exam Triage Vital Signs ED Triage Vitals  Enc Vitals Group     BP 06/16/20 0817 137/79     Pulse Rate 06/16/20 0817 72     Resp 06/16/20 0817 16     Temp 06/16/20 0817 98.2 F (36.8 C)     Temp src --      SpO2 06/16/20 0817 100 %     Weight --      Height --      Head Circumference --      Peak Flow --      Pain Score 06/16/20 0814 7     Pain Loc --      Pain Edu? --      Excl. in [place]? --    No data found.  Updated Vital Signs BP 137/79 (BP  Location: Right Arm)   Pulse 72   Temp 98.2 F (36.8 C)   Resp 16   LMP  (LMP Unknown)   SpO2 100%   Visual Acuity Right Eye Distance: 20/40 Left Eye Distance: 20/50 Bilateral Distance: 20/50  Right Eye Near:   Left Eye Near:    Bilateral Near:     Physical Exam Vitals reviewed.  Constitutional:      Appearance: Normal appearance.  HENT:     Head: Normocephalic and atraumatic.     Mouth/Throat:     Mouth: Mucous membranes are moist.  Eyes:     General: Lids are normal. Lids are everted, no foreign bodies appreciated. Vision grossly intact. No visual field deficit.       Right eye: No foreign body, discharge or hordeolum.        Left eye: No foreign body, discharge or hordeolum.     Extraocular Movements: Extraocular movements intact.     Conjunctiva/sclera:     Right eye: Right conjunctiva is not injected. No exudate or hemorrhage.    Left eye: Left conjunctiva is not injected. Hemorrhage present. No exudate.    Pupils: Pupils are equal, round, and reactive to light.     Visual Fields: Right eye visual fields normal and left eye visual fields normal.     Comments: Subconjunctival hemorrhage left outer conjunctiva. No flourescein uptake. Small overlying 7mm round clear area of swelling, without flourescein uptake.  Cardiovascular:     Rate and Rhythm: Normal rate and regular rhythm.     Pulses:          Radial pulses are 2+ on the right side and 2+ on the left side.     Heart sounds: Normal heart sounds.  Pulmonary:     Effort: Pulmonary effort is normal.     Breath sounds: Normal breath sounds.  Chest:     Chest wall: No tenderness.  Musculoskeletal:     Right lower leg: No edema.     Left lower leg: No edema.  Neurological:     General: No focal deficit present.     Mental Status: She is alert and oriented to person, place, and time.  Psychiatric:        Mood and Affect: Mood is anxious.        Behavior: Behavior normal.        Thought Content: Thought content  normal.        Judgment: Judgment normal.      UC Treatments / Results  Labs (all labs ordered are listed, but only abnormal results are displayed) Labs Reviewed - No data to display  EKG   Radiology No results found.  Procedures Procedures (including critical care time)  Medications Ordered in UC Medications - No data to display  Initial Impression / Assessment and Plan / UC Course  I have reviewed the triage vital signs and the nursing notes.  Pertinent labs & imaging results that were available during my care of the patient were reviewed by me and considered in my medical decision making (see chart for details).      This patient is a 50 year old female presenting with subconjunctival hemorrhage, chest pain.  BP today in normal range. Pt does not have diagnosis of hypertension, monitors BP at home and this is also in normal range at home. Continue to monitor BP at home. Vision intact. Wears glasses but not contacts.  Polytrim sent as below. Given history of dry eye, follow-up with eye doctor in next 1-2 days. Sooner if new vision changes. Pt with vague complaint of left-sided chest pain that is relieved by burping. No history of cardiopulmonary disease. EKG today NSR, unchanged from 2019 EKG. ER/return precautions discussed.   This chart was dictated using voice recognition software, Dragon. Despite the best efforts of this provider to proofread and correct errors, errors may still occur which can change documentation meaning.  Final Clinical Impressions(s) / UC  Diagnoses   Final diagnoses:  Subconjunctival hemorrhage of left eye  Atypical chest pain  Dry eye  Chest pain with normal EKG     Discharge Instructions     -Please contact your eye doctor and follow-up in the next 1 to 2 days. -If you develop new symptoms like vision changes, pain with eye movement, vision loss-head straight to the ER. -If you continue to experience chest pain, or your chest pain  gets worse, pain in left arm, dizziness, shortness of breath-head straight to the ER.    ED Prescriptions    Medication Sig Dispense Auth. Provider   trimethoprim-polymyxin b (POLYTRIM) ophthalmic solution Place 1 drop into the left eye every 4 (four) hours for 7 days. 10 mL Hazel Sams, PA-C     PDMP not reviewed this encounter.   Hazel Sams, PA-C 06/16/20 [phone]

## 2020-12-21 ENCOUNTER — Other Ambulatory Visit: Payer: Self-pay

## 2020-12-21 ENCOUNTER — Emergency Department (HOSPITAL_COMMUNITY)
Admission: EM | Admit: 2020-12-21 | Discharge: 2020-12-21 | Disposition: A | Discharge status: Home / Self Care | Payer: Managed Care, Other (non HMO) | Attending: Emergency Medicine | Admitting: Emergency Medicine

## 2020-12-21 DIAGNOSIS — Z87891 Personal history of nicotine dependence: Secondary | ICD-10-CM | POA: Diagnosis not present

## 2020-12-21 DIAGNOSIS — R112 Nausea with vomiting, unspecified: Secondary | ICD-10-CM | POA: Diagnosis present

## 2020-12-21 DIAGNOSIS — Z20822 Contact with and (suspected) exposure to covid-19: Secondary | ICD-10-CM | POA: Diagnosis not present

## 2020-12-21 LAB — CBC
HCT: 38.4 % (ref 36.0–46.0)
Hemoglobin: 13 g/dL (ref 12.0–15.0)
MCH: 25.6 pg — ABNORMAL LOW (ref 26.0–34.0)
MCHC: 33.9 g/dL (ref 30.0–36.0)
MCV: 75.7 fL — ABNORMAL LOW (ref 80.0–100.0)
Platelets: 261 10*3/uL (ref 150–400)
RBC: 5.07 MIL/uL (ref 3.87–5.11)
RDW: 14.8 % (ref 11.5–15.5)
WBC: 7.2 10*3/uL (ref 4.0–10.5)
nRBC: 0 % (ref 0.0–0.2)

## 2020-12-21 LAB — SARS CORONAVIRUS 2 (TAT 6-24 HRS): SARS Coronavirus 2: NEGATIVE

## 2020-12-21 LAB — URINALYSIS, ROUTINE W REFLEX MICROSCOPIC
Bacteria, UA: NONE SEEN
Bilirubin Urine: NEGATIVE
Glucose, UA: NEGATIVE mg/dL
Hgb urine dipstick: NEGATIVE
Ketones, ur: NEGATIVE mg/dL
Leukocytes,Ua: NEGATIVE
Nitrite: NEGATIVE
Protein, ur: NEGATIVE mg/dL
Specific Gravity, Urine: 1.027 (ref 1.005–1.030)
pH: 5 (ref 5.0–8.0)

## 2020-12-21 LAB — COMPREHENSIVE METABOLIC PANEL
ALT: 15 U/L (ref 0–44)
AST: 20 U/L (ref 15–41)
Albumin: 4 g/dL (ref 3.5–5.0)
Alkaline Phosphatase: 71 U/L (ref 38–126)
Anion gap: 10 (ref 5–15)
BUN: 9 mg/dL (ref 6–20)
CO2: 24 mmol/L (ref 22–32)
Calcium: 9.1 mg/dL (ref 8.9–10.3)
Chloride: 102 mmol/L (ref 98–111)
Creatinine, Ser: 0.87 mg/dL (ref 0.44–1.00)
GFR, Estimated: >60 mL/min (ref >60)
Glucose, Bld: 117 mg/dL — ABNORMAL HIGH (ref 70–99)
Potassium: 3.9 mmol/L (ref 3.5–5.1)
Sodium: 136 mmol/L (ref 135–145)
Total Bilirubin: 1 mg/dL (ref 0.3–1.2)
Total Protein: 7 g/dL (ref 6.5–8.1)

## 2020-12-21 LAB — LIPASE, BLOOD: Lipase: 21 U/L (ref 11–51)

## 2020-12-21 LAB — I-STAT BETA HCG BLOOD, ED (MC, WL, AP ONLY): I-stat hCG, quantitative: <5 m[IU]/mL (ref <5)

## 2020-12-21 MED ORDER — SODIUM CHLORIDE 0.9 % IV BOLUS
1000.0000 mL | Freq: Once | INTRAVENOUS | Status: AC
  Administered 2020-12-21: 1000 mL via INTRAVENOUS

## 2020-12-21 MED ORDER — ONDANSETRON 4 MG PO TBDP
4.0000 mg | ORAL_TABLET | Freq: Once | ORAL | Status: AC | PRN
  Administered 2020-12-21: 4 mg via ORAL
  Filled 2020-12-21: qty 1

## 2020-12-21 MED ORDER — ACETAMINOPHEN 325 MG PO TABS
325.0000 mg | ORAL_TABLET | Freq: Once | ORAL | Status: AC
  Administered 2020-12-21: 325 mg via ORAL
  Filled 2020-12-21: qty 1

## 2020-12-21 MED ORDER — ONDANSETRON 4 MG PO TBDP: 4.0000 mg | ORAL_TABLET | Freq: Three times a day (TID) | ORAL | 0 refills | Status: DC | PRN

## 2020-12-21 MED ORDER — ONDANSETRON HCL 4 MG/2ML IJ SOLN
4.0000 mg | Freq: Once | INTRAMUSCULAR | Status: AC
Start: 2020-12-21 — End: 2020-12-21
  Administered 2020-12-21: 4 mg via INTRAVENOUS
  Filled 2020-12-21: qty 2

## 2020-12-21 NOTE — Discharge Instructions (Signed)
Take Zofran every 8 hours as needed for nausea and vomiting.  Continue drinking plenty of fluids at home.  COVID test will result in 24 hours, you can see them on MyChart or you should receive a call if it is positive.  Return if things change or if you are unable to tolerate oral intake.

## 2020-12-21 NOTE — ED Provider Notes (Signed)
Medical screening examination/treatment/procedure(s) were conducted as a shared visit with non-physician practitioner(s) and myself.  I personally evaluated the patient during the encounter.  Clinical Impression:   Final diagnoses:  None   Pt presents after having n/v this morning- has had chole in the past. She has a benign abdomen, no other sources of fever clinically, she does have some lower back pain so we will get a urinalysis, labs are unremarkable, vital signs without tachycardia hypotension, low-grade fever of 100.4.  Soft abdomen, nontender, clear lungs, clear heart, no edema, no rash, clear oropharynx.  Urinalysis pending, anticipate discharge with supportive care for nausea vomiting.  Rule out COVID   Noemi Chapel, MD 12/25/20 [phone]

## 2020-12-21 NOTE — ED Provider Notes (Signed)
[facility] EMERGENCY DEPARTMENT Provider Note   CSN: [id number] Arrival date & time: 12/21/20  [id number]     History Chief Complaint  Patient presents with  . Emesis    Brittany Cherry is a 50 y.o. female.  HPI  Patient with history of GERD presents with nausea and vomiting x1 day.  She is status postcholecystectomy.  Patient reports returning from work yesterday and feeling fatigued as well as febrile.  She checked her temperature and it was orally 100.9.  She went to bed and then woke up feeling nauseated and had multiple episodes of emesis.  This continued throughout the night, it was improved by Zofran when in the ED waiting room.  She reports that the vomit was yellow/green looking like bile.  Denies any associated abdominal pain or diarrhea.  Patient reports since Saturday she has been feeling constipated.  She has had small bowel movements with magnesium citrate suppository and is passing gas.  No other abdominal surgeries other than the cholecystectomy last year.  No dysuria, hematuria, vaginal discharge, pelvic pain.  She took a COVID test at home which resulted negative.  Past Medical History:  Diagnosis Date  . AMA (advanced maternal age) multigravida 31+   . Anemia   . Depression   . GERD (gastroesophageal reflux disease)   . Headache   . Hyperemesis gravidarum     Patient Active Problem List   Diagnosis Date Noted  . Primary osteoarthritis of both knees 06/22/2018  . Acute anemia 02/26/2017  . Chronic pain of both knees 06/07/2016  . NSVD (normal spontaneous vaginal delivery) 08/20/2013  . S/P tubal ligation 08/20/2013  . PROM (premature rupture of membranes) 08/18/2013  . Elderly multigravida with antepartum condition or complication [id number]    Past Surgical History:  Procedure Laterality Date  . CHOLECYSTECTOMY N/A 04/08/2020   Procedure: LAPAROSCOPIC CHOLECYSTECTOMY;  Surgeon: Dwan Bolt, MD;  Location: [place];  Service: General;   Laterality: N/A;  . COLONOSCOPY WITH PROPOFOL N/A 02/27/2017   Procedure: COLONOSCOPY WITH PROPOFOL;  Surgeon: Ronnette Juniper, MD;  Location: [place];  Service: Gastroenterology;  Laterality: N/A;  . DILATION AND CURETTAGE OF UTERUS    . ESOPHAGOGASTRODUODENOSCOPY (EGD) WITH PROPOFOL N/A 02/27/2017   Procedure: ESOPHAGOGASTRODUODENOSCOPY (EGD) WITH PROPOFOL;  Surgeon: Ronnette Juniper, MD;  Location: [place];  Service: Gastroenterology;  Laterality: N/A;  . KNEE ARTHROSCOPY Bilateral   . TUBAL LIGATION Bilateral 08/18/2013   Procedure: POST PARTUM TUBAL LIGATION;  Surgeon: Florian Buff, MD;  Location: [facility];  Service: Gynecology;  Laterality: Bilateral;  . WISDOM TOOTH EXTRACTION       OB History     Gravida  8   Para  4   Term  4   Preterm      AB  4   Living  4      SAB  2   IAB  2   Ectopic      Multiple      Live Births  4           No family history on file.  Social History   Tobacco Use  . Smoking status: Former    Types: Cigarettes    Quit date: 09/09/2016    Years since quitting: 4.2  . Smokeless tobacco: Never  Vaping Use  . Vaping Use: Never used  Substance Use Topics  . Alcohol use: Yes    Comment: occassionally  . Drug use: No    Home Medications Prior  to Admission medications   Medication Sig Start Date End Date Taking? Authorizing Provider  Aspirin-Salicylamide-Caffeine (BC HEADACHE PO) Take 1 packet by mouth daily as needed (pain).    [provider]  ferrous gluconate (FERGON) 324 MG tablet Take 1 tablet (324 mg total) daily with breakfast by mouth. 02/27/17   Burgess Estelle, MD  naproxen (NAPROSYN) 500 MG tablet Take 1 tablet (500 mg total) by mouth 2 (two) times daily. 10/26/18   Zigmund Gottron, NP  omeprazole (PRILOSEC) 20 MG capsule Take 1 capsule (20 mg total) by mouth daily. 10/26/18   Zigmund Gottron, NP  oxyCODONE (OXY IR/ROXICODONE) 5 MG immediate release tablet Take 1 tablet (5 mg total) by mouth every 6 (six)  hours as needed for severe pain. 04/08/20   Dwan Bolt, MD  pantoprazole (PROTONIX) 40 MG tablet Take 40 mg by mouth daily.    [provider]  traZODone (DESYREL) 50 MG tablet Take 50 mg by mouth at bedtime as needed for sleep.    [provider]    Allergies    Ativan [lorazepam]  Review of Systems   Review of Systems  Constitutional:  Positive for fatigue and fever.  HENT:  Negative for congestion.   Respiratory:  Negative for cough and shortness of breath.   Cardiovascular:  Negative for chest pain.  Gastrointestinal:  Positive for constipation, nausea and vomiting. Negative for abdominal pain and diarrhea.  Genitourinary:  Negative for dysuria, flank pain, pelvic pain and vaginal discharge.  Musculoskeletal:  Positive for back pain and myalgias.  Neurological:  Negative for headaches.   Physical Exam Updated Vital Signs BP (!) 123/91 (BP Location: Right Arm)   Pulse 91   Temp (!) 100.4 F (38 C) (Oral)   Resp 16   Ht '5\' 2"'$  (1.575 m)   Wt 90.7 kg   SpO2 98%   BMI 36.58 kg/m   Physical Exam Vitals and nursing note reviewed. Exam conducted with a chaperone present.  Constitutional:      Appearance: She is obese.  HENT:     Head: Normocephalic and atraumatic.  Eyes:     General: No scleral icterus.       Right eye: No discharge.        Left eye: No discharge.     Extraocular Movements: Extraocular movements intact.     Pupils: Pupils are equal, round, and reactive to light.  Cardiovascular:     Rate and Rhythm: Normal rate and regular rhythm.     Pulses: Normal pulses.     Heart sounds: Normal heart sounds. No murmur heard.   No friction rub. No gallop.  Pulmonary:     Effort: Pulmonary effort is normal. No respiratory distress.     Breath sounds: Normal breath sounds.  Abdominal:     General: Abdomen is flat. Bowel sounds are normal. There is no distension.     Palpations: Abdomen is soft.     Tenderness: There is no abdominal  tenderness.     Comments: Abdomen is soft and nontender.  No CVA tenderness  Skin:    General: Skin is warm and dry.     Coloration: Skin is not jaundiced.  Neurological:     Mental Status: She is alert. Mental status is at baseline.     Coordination: Coordination normal.    ED Results / Procedures / Treatments   Labs (all labs ordered are listed, but only abnormal results are displayed) Labs Reviewed  COMPREHENSIVE METABOLIC  PANEL - Abnormal; Notable for the following components:      Result Value   Glucose, Bld 117 (*)    All other components within normal limits  CBC - Abnormal; Notable for the following components:   MCV 75.7 (*)    MCH 25.6 (*)    All other components within normal limits  LIPASE, BLOOD  URINALYSIS, ROUTINE W REFLEX MICROSCOPIC  I-STAT BETA HCG BLOOD, ED ([facility], [facility], [facility] ONLY)    EKG None  Radiology No results found.  Procedures Procedures   Medications Ordered in ED Medications  sodium chloride 0.9 % bolus 1,000 mL (has no administration in time range)  acetaminophen (TYLENOL) tablet 325 mg (has no administration in time range)  ondansetron (ZOFRAN) injection 4 mg (has no administration in time range)  ondansetron (ZOFRAN-ODT) disintegrating tablet 4 mg (4 mg Oral Given 12/21/20 0734)    ED Course  I have reviewed the triage vital signs and the nursing notes.  Pertinent labs & imaging results that were available during my care of the patient were reviewed by me and considered in my medical decision making (see chart for details).  Clinical Course as of 12/21/20 1502  Mon Dec 21, 2020  1349 Pulse Rate(!): 138 82 [HS]    Clinical Course User Index [HS] Sherrill Raring, PA-C   MDM Rules/Calculators/A&P                           Patient is febrile with a temperature of 100.4 orally.  Physical exam is relatively benign, no abdominal pain.  She has bowel sounds and is passing gas so relatively low suspicion for bowel obstruction.  Patient is having  diarrhea as a presentation is not classic with a gastroenteritis.  Pancreatitis is a possibility, patient denies any alcohol use.  Additionally, lipase is not significantly elevated making pancreatitis relatively unlikely.  No gross electrolyte derangement from multiple episodes of emesis, will give fluids and Zofran for now.    Patient is not pregnant, not an ectopic pregnancy.  No leukocytosis.  Urine without signs of UTI.  No electrolyte derangement, lipase not elevated consistent with pancreatitis.  Patient does have a fever, this in commendation with the emesis makes viral gastritis most likely.   Will discharge with supportive care.    Final Clinical Impression(s) / ED Diagnoses Final diagnoses:  None    Rx / DC Orders ED Discharge Orders     None        Sherrill Raring, Hershal Coria 12/21/20 [place], MD 12/25/20 [phone]

## 2020-12-21 NOTE — ED Triage Notes (Signed)
Pt arrived complaining of N/V starting yesterday.  States she has vomited 8x, pt states vomit is yellow/green. Denies diarrhea  Pain 5/10 in lower back.  Hx of gallbladder removal.

## 2020-12-25 ENCOUNTER — Emergency Department (HOSPITAL_BASED_OUTPATIENT_CLINIC_OR_DEPARTMENT_OTHER)
Admission: EM | Admit: 2020-12-25 | Discharge: 2020-12-25 | Disposition: A | Discharge status: Home / Self Care | Payer: Managed Care, Other (non HMO) | Attending: Emergency Medicine | Admitting: Emergency Medicine

## 2020-12-25 ENCOUNTER — Emergency Department (HOSPITAL_BASED_OUTPATIENT_CLINIC_OR_DEPARTMENT_OTHER): Payer: Managed Care, Other (non HMO)

## 2020-12-25 ENCOUNTER — Encounter (HOSPITAL_BASED_OUTPATIENT_CLINIC_OR_DEPARTMENT_OTHER): Payer: Self-pay | Admitting: *Deleted

## 2020-12-25 ENCOUNTER — Other Ambulatory Visit: Payer: Self-pay

## 2020-12-25 DIAGNOSIS — K5900 Constipation, unspecified: Secondary | ICD-10-CM | POA: Diagnosis present

## 2020-12-25 DIAGNOSIS — Z7982 Long term (current) use of aspirin: Secondary | ICD-10-CM | POA: Diagnosis not present

## 2020-12-25 DIAGNOSIS — Z87891 Personal history of nicotine dependence: Secondary | ICD-10-CM | POA: Diagnosis not present

## 2020-12-25 DIAGNOSIS — R1084 Generalized abdominal pain: Secondary | ICD-10-CM | POA: Insufficient documentation

## 2020-12-25 DIAGNOSIS — R112 Nausea with vomiting, unspecified: Secondary | ICD-10-CM | POA: Insufficient documentation

## 2020-12-25 LAB — COMPREHENSIVE METABOLIC PANEL
ALT: 18 U/L (ref 0–44)
AST: 14 U/L — ABNORMAL LOW (ref 15–41)
Albumin: 4.6 g/dL (ref 3.5–5.0)
Alkaline Phosphatase: 67 U/L (ref 38–126)
Anion gap: 9 (ref 5–15)
BUN: 10 mg/dL (ref 6–20)
CO2: 26 mmol/L (ref 22–32)
Calcium: 9.1 mg/dL (ref 8.9–10.3)
Chloride: 104 mmol/L (ref 98–111)
Creatinine, Ser: 0.79 mg/dL (ref 0.44–1.00)
GFR, Estimated: >60 mL/min (ref >60)
Glucose, Bld: 94 mg/dL (ref 70–99)
Potassium: 3.5 mmol/L (ref 3.5–5.1)
Sodium: 139 mmol/L (ref 135–145)
Total Bilirubin: 0.4 mg/dL (ref 0.3–1.2)
Total Protein: 7.3 g/dL (ref 6.5–8.1)

## 2020-12-25 LAB — URINALYSIS, ROUTINE W REFLEX MICROSCOPIC
Bilirubin Urine: NEGATIVE
Glucose, UA: NEGATIVE mg/dL
Hgb urine dipstick: NEGATIVE
Ketones, ur: NEGATIVE mg/dL
Leukocytes,Ua: NEGATIVE
Nitrite: NEGATIVE
Protein, ur: 30 mg/dL — AB
Specific Gravity, Urine: >1.046 — ABNORMAL HIGH (ref 1.005–1.030)
pH: 6.5 (ref 5.0–8.0)

## 2020-12-25 LAB — PREGNANCY, URINE: Preg Test, Ur: NEGATIVE

## 2020-12-25 LAB — CBC
HCT: 33.9 % — ABNORMAL LOW (ref 36.0–46.0)
Hemoglobin: 11.5 g/dL — ABNORMAL LOW (ref 12.0–15.0)
MCH: 25.4 pg — ABNORMAL LOW (ref 26.0–34.0)
MCHC: 33.9 g/dL (ref 30.0–36.0)
MCV: 75 fL — ABNORMAL LOW (ref 80.0–100.0)
Platelets: 268 10*3/uL (ref 150–400)
RBC: 4.52 MIL/uL (ref 3.87–5.11)
RDW: 14.8 % (ref 11.5–15.5)
WBC: 7.2 10*3/uL (ref 4.0–10.5)
nRBC: 0 % (ref 0.0–0.2)

## 2020-12-25 LAB — LIPASE, BLOOD: Lipase: 10 U/L — ABNORMAL LOW (ref 11–51)

## 2020-12-25 MED ORDER — FENTANYL CITRATE PF 50 MCG/ML IJ SOSY
50.0000 ug | PREFILLED_SYRINGE | Freq: Once | INTRAMUSCULAR | Status: AC
Start: 2020-12-25 — End: 2020-12-25
  Administered 2020-12-25: 50 ug via INTRAVENOUS
  Filled 2020-12-25: qty 1

## 2020-12-25 MED ORDER — ONDANSETRON HCL 4 MG/2ML IJ SOLN
4.0000 mg | Freq: Once | INTRAMUSCULAR | Status: AC
  Administered 2020-12-25: 4 mg via INTRAVENOUS
  Filled 2020-12-25: qty 2

## 2020-12-25 MED ORDER — IOHEXOL 350 MG/ML SOLN
100.0000 mL | Freq: Once | INTRAVENOUS | Status: AC | PRN
  Administered 2020-12-25: 80 mL via INTRAVENOUS

## 2020-12-25 MED ORDER — ONDANSETRON 4 MG PO TBDP: 4.0000 mg | ORAL_TABLET | Freq: Three times a day (TID) | ORAL | 0 refills | Status: DC | PRN

## 2020-12-25 NOTE — ED Notes (Signed)
Patient transported to CT 

## 2020-12-25 NOTE — ED Triage Notes (Signed)
Vomiting started Sunday, Constipation with last normal BM 12/15/20. Has had a little diarrhea and jelly type stool. Last emesis yesterday morning. Has been taking Zofran frequently for nausea and vomiting. Has tried Enema without results.

## 2020-12-25 NOTE — ED Provider Notes (Signed)
[place] EMERGENCY DEPT Provider Note   CSN: [id number] Arrival date & time: 12/25/20  0749     History Chief Complaint  Patient presents with   Abdominal Pain   Emesis   Constipation    Brittany Cherry is a 50 y.o. female.  Presents to the emergency room with concern for abdominal pain, nausea, vomiting, constipation.  She was seen in ER on 9/12 for similar symptoms, noted to also have low-grade temperature 100.4 F.  Felt to have likely viral gastroenteritis and discharged home.  She reports that since that time her symptoms have persisted, continues to have abdominal discomfort also having issues with ongoing constipation.  Has tried multiple different stool softeners and laxatives with no relief.  Is occasionally passing gas.  Felt nauseated after trying to eat cookout last night.  HPI     Past Medical History:  Diagnosis Date   AMA (advanced maternal age) multigravida 35+    Anemia    Depression    GERD (gastroesophageal reflux disease)    Headache    Hyperemesis gravidarum     Patient Active Problem List   Diagnosis Date Noted   Primary osteoarthritis of both knees 06/22/2018   Acute anemia 02/26/2017   Chronic pain of both knees 06/07/2016   NSVD (normal spontaneous vaginal delivery) 08/20/2013   S/P tubal ligation 08/20/2013   PROM (premature rupture of membranes) 08/18/2013   Elderly multigravida with antepartum condition or complication 123XX123    Past Surgical History:  Procedure Laterality Date   CHOLECYSTECTOMY N/A 04/08/2020   Procedure: LAPAROSCOPIC CHOLECYSTECTOMY;  Surgeon: Dwan Bolt, MD;  Location: [place];  Service: General;  Laterality: N/A;   COLONOSCOPY WITH PROPOFOL N/A 02/27/2017   Procedure: COLONOSCOPY WITH PROPOFOL;  Surgeon: Ronnette Juniper, MD;  Location: [place];  Service: Gastroenterology;  Laterality: N/A;   DILATION AND CURETTAGE OF UTERUS     ESOPHAGOGASTRODUODENOSCOPY (EGD) WITH PROPOFOL N/A 02/27/2017    Procedure: ESOPHAGOGASTRODUODENOSCOPY (EGD) WITH PROPOFOL;  Surgeon: Ronnette Juniper, MD;  Location: [place];  Service: Gastroenterology;  Laterality: N/A;   KNEE ARTHROSCOPY Bilateral    TUBAL LIGATION Bilateral 08/18/2013   Procedure: POST PARTUM TUBAL LIGATION;  Surgeon: Florian Buff, MD;  Location: [facility];  Service: Gynecology;  Laterality: Bilateral;   WISDOM TOOTH EXTRACTION       OB History     Gravida  8   Para  4   Term  4   Preterm      AB  4   Living  4      SAB  2   IAB  2   Ectopic      Multiple      Live Births  4           No family history on file.  Social History   Tobacco Use   Smoking status: Former    Types: Cigarettes    Quit date: 09/09/2016    Years since quitting: 4.2   Smokeless tobacco: Never  Vaping Use   Vaping Use: Never used  Substance Use Topics   Alcohol use: Yes    Comment: occassionally   Drug use: No    Home Medications Prior to Admission medications   Medication Sig Start Date End Date Taking? Authorizing Provider  Aspirin-Salicylamide-Caffeine (BC HEADACHE PO) Take 1 packet by mouth daily as needed (pain).    [provider]  ferrous gluconate (FERGON) 324 MG tablet Take 1 tablet (324 mg total) daily with breakfast  by mouth. 02/27/17   Burgess Estelle, MD  naproxen (NAPROSYN) 500 MG tablet Take 1 tablet (500 mg total) by mouth 2 (two) times daily. 10/26/18   Zigmund Gottron, NP  omeprazole (PRILOSEC) 20 MG capsule Take 1 capsule (20 mg total) by mouth daily. 10/26/18   Zigmund Gottron, NP  ondansetron (ZOFRAN ODT) 4 MG disintegrating tablet Take 1 tablet (4 mg total) by mouth every 8 (eight) hours as needed for nausea or vomiting. 12/25/20   Lucrezia Starch, MD  oxyCODONE (OXY IR/ROXICODONE) 5 MG immediate release tablet Take 1 tablet (5 mg total) by mouth every 6 (six) hours as needed for severe pain. 04/08/20   Dwan Bolt, MD  pantoprazole (PROTONIX) 40 MG tablet Take 40 mg by mouth daily.     [provider]  traZODone (DESYREL) 50 MG tablet Take 50 mg by mouth at bedtime as needed for sleep.    [provider]    Allergies    Ativan [lorazepam]  Review of Systems   Review of Systems  Constitutional:  Positive for fatigue. Negative for chills and fever.  HENT:  Negative for ear pain and sore throat.   Eyes:  Negative for pain and visual disturbance.  Respiratory:  Negative for cough and shortness of breath.   Cardiovascular:  Negative for chest pain and palpitations.  Gastrointestinal:  Positive for abdominal pain, constipation, nausea and vomiting.  Genitourinary:  Negative for dysuria and hematuria.  Musculoskeletal:  Negative for arthralgias and back pain.  Skin:  Negative for color change and rash.  Neurological:  Negative for seizures and syncope.  All other systems reviewed and are negative.  Physical Exam Updated Vital Signs BP 110/61   Pulse 62   Temp 98.4 F (36.9 C) (Oral)   Resp 14   Ht '5\' 2"'$  (1.575 m)   Wt 90.7 kg   SpO2 97%   BMI 36.58 kg/m   Physical Exam Vitals and nursing note reviewed.  Constitutional:      General: She is not in acute distress.    Appearance: She is well-developed.  HENT:     Head: Normocephalic and atraumatic.  Eyes:     Conjunctiva/sclera: Conjunctivae normal.  Cardiovascular:     Rate and Rhythm: Normal rate and regular rhythm.     Heart sounds: No murmur heard. Pulmonary:     Effort: Pulmonary effort is normal. No respiratory distress.     Breath sounds: Normal breath sounds.  Abdominal:     Palpations: Abdomen is soft.     Tenderness: There is generalized abdominal tenderness. There is no guarding or rebound.  Musculoskeletal:     Cervical back: Neck supple.  Skin:    General: Skin is warm and dry.  Neurological:     General: No focal deficit present.     Mental Status: She is alert.  Psychiatric:        Mood and Affect: Mood normal.    ED Results / Procedures / Treatments    Labs (all labs ordered are listed, but only abnormal results are displayed) Labs Reviewed  LIPASE, BLOOD - Abnormal; Notable for the following components:      Result Value   Lipase 10 (*)    All other components within normal limits  COMPREHENSIVE METABOLIC PANEL - Abnormal; Notable for the following components:   AST 14 (*)    All other components within normal limits  CBC - Abnormal; Notable for the following components:   Hemoglobin 11.5 (*)  HCT 33.9 (*)    MCV 75.0 (*)    MCH 25.4 (*)    All other components within normal limits  URINALYSIS, ROUTINE W REFLEX MICROSCOPIC - Abnormal; Notable for the following components:   Specific Gravity, Urine >1.046 (*)    Protein, ur 30 (*)    All other components within normal limits  PREGNANCY, URINE    EKG None  Radiology CT ABDOMEN PELVIS W CONTRAST  Result Date: 12/25/2020 CLINICAL DATA:  Vomiting starting 'Sunday. Abdominal pain. Concern for small bowel obstruction. EXAM: CT ABDOMEN AND PELVIS WITH CONTRAST TECHNIQUE: Multidetector CT imaging of the abdomen and pelvis was performed using the standard protocol following bolus administration of intravenous contrast. CONTRAST:  80mL OMNIPAQUE IOHEXOL 350 MG/ML SOLN COMPARISON:  07/17/2019 abdominal ultrasound. FINDINGS: Lower chest: Clear lung bases. Normal heart size without pericardial or pleural effusion. Hepatobiliary: Normal liver. Cholecystectomy, without biliary ductal dilatation. Pancreas: Normal, without mass or ductal dilatation. Spleen: Normal in size, without focal abnormality. Adrenals/Urinary Tract: 2.3 cm right adrenal nodule and demonstrates indeterminate density measurements on this nondedicated CT. Too small to characterize upper pole right renal lesion. Normal left kidney. No hydronephrosis. Normal urinary bladder. Stomach/Bowel: Normal stomach, without wall thickening. Scattered colonic diverticula. Normal terminal ileum and appendix. Normal small bowel.  Vascular/Lymphatic: Aortic atherosclerosis. No abdominopelvic adenopathy. Reproductive: Normal uterus and adnexa. Other: No significant free fluid. No free intraperitoneal air. Fat containing periumbilical ventral wall hernia small including on 51/2. Musculoskeletal: No acute osseous abnormality. IMPRESSION: 1.  No acute process in the abdomen or pelvis. 2. Right adrenal lesion which measures 2.3 cm and is indeterminate based on density measurements. Per consensus criteria, this warrants nonemergent follow-up with dedicated adrenal protocol pre and postcontrast CT. This recommendation follows ACR consensus guidelines: Management of Incidental Adrenal Masses: A White Paper of the ACR Incidental Findings Committee. J Am Coll Radiol 2017;14:1038-1044. Electronically Signed   By: Kyle  Talbot M.D.   On: 12/25/2020 09:04    Procedures Procedures   Medications Ordered in ED Medications  fentaNYL (SUBLIMAZE) injection 50 mcg (50 mcg Intravenous Given 12/25/20 0858)  ondansetron (ZOFRAN) injection 4 mg (4 mg Intravenous Given 12/25/20 0854)  iohexol (OMNIPAQUE) 350 MG/ML injection 100 mL (80 mLs Intravenous Contrast Given 12/25/20 0834)    ED Course  I have reviewed the triage vital signs and the nursing notes.  Pertinent labs & imaging results that were available during my care of the patient were reviewed by me and considered in my medical decision making (see chart for details).    MDM Rules/Calculators/A&P                           50'$  year old with abdominal pain, nausea and vomiting.  On exam appears well but did have generalized tenderness to palpation.  Basic labs were grossly stable.  CT scan negative for acute pathology.  Radiologist did comment on adrenal lesion.  Discussed incidental finding with patient and instructed to follow-up with primary care.  Recommended course of MiraLAX for constipation.  Reviewed return precautions and discharged.  After the discussed management above, the patient  was determined to be safe for discharge.  The patient was in agreement with this plan and all questions regarding their care were answered.  ED return precautions were discussed and the patient will return to the ED with any significant worsening of condition.  Final Clinical Impression(s) / ED Diagnoses Final diagnoses:  Constipation, unspecified constipation type  Abdominal pain, generalized  Rx / DC Orders ED Discharge Orders          Ordered    ondansetron (ZOFRAN ODT) 4 MG disintegrating tablet  Every 8 hours PRN        12/25/20 1104             Lucrezia Starch, MD 12/25/20 1106

## 2020-12-25 NOTE — Discharge Instructions (Addendum)
Recommend taking MiraLAX as discussed.  Come back to ER if you develop uncontrolled vomiting, worsening pain, fever or other new concerning symptom.  Recommend follow-up with primary doctor.  The radiologist commented on an incidental finding in your right adrenal gland.  They recommended a specialized CT scan to further assess.  Your primary doctor can make this arrangement.

## 2021-01-25 ENCOUNTER — Encounter (HOSPITAL_COMMUNITY): Payer: Self-pay | Admitting: Radiology

## 2021-05-18 ENCOUNTER — Emergency Department (HOSPITAL_COMMUNITY)
Admission: EM | Admit: 2021-05-18 | Discharge: 2021-05-19 | Disposition: A | Discharge status: Home / Self Care | Payer: Self-pay | Attending: Emergency Medicine | Admitting: Emergency Medicine

## 2021-05-18 ENCOUNTER — Encounter (HOSPITAL_COMMUNITY): Payer: Self-pay | Admitting: Emergency Medicine

## 2021-05-18 ENCOUNTER — Emergency Department (HOSPITAL_COMMUNITY): Payer: Self-pay

## 2021-05-18 DIAGNOSIS — Z7982 Long term (current) use of aspirin: Secondary | ICD-10-CM | POA: Insufficient documentation

## 2021-05-18 DIAGNOSIS — M79602 Pain in left arm: Secondary | ICD-10-CM | POA: Insufficient documentation

## 2021-05-18 DIAGNOSIS — R42 Dizziness and giddiness: Secondary | ICD-10-CM | POA: Insufficient documentation

## 2021-05-18 DIAGNOSIS — R0789 Other chest pain: Secondary | ICD-10-CM | POA: Insufficient documentation

## 2021-05-18 LAB — CBC WITH DIFFERENTIAL/PLATELET
Abs Immature Granulocytes: 0.06 10*3/uL (ref 0.00–0.07)
Basophils Absolute: 0 10*3/uL (ref 0.0–0.1)
Basophils Relative: 1 %
Eosinophils Absolute: 0.3 10*3/uL (ref 0.0–0.5)
Eosinophils Relative: 4 %
HCT: 38.2 % (ref 36.0–46.0)
Hemoglobin: 13.1 g/dL (ref 12.0–15.0)
Immature Granulocytes: 1 %
Lymphocytes Relative: 27 %
Lymphs Abs: 1.8 10*3/uL (ref 0.7–4.0)
MCH: 27.2 pg (ref 26.0–34.0)
MCHC: 34.3 g/dL (ref 30.0–36.0)
MCV: 79.4 fL — ABNORMAL LOW (ref 80.0–100.0)
Monocytes Absolute: 0.5 10*3/uL (ref 0.1–1.0)
Monocytes Relative: 7 %
Neutro Abs: 4.2 10*3/uL (ref 1.7–7.7)
Neutrophils Relative %: 60 %
Platelets: 308 10*3/uL (ref 150–400)
RBC: 4.81 MIL/uL (ref 3.87–5.11)
RDW: 14.4 % (ref 11.5–15.5)
WBC: 6.9 10*3/uL (ref 4.0–10.5)
nRBC: 0 % (ref 0.0–0.2)

## 2021-05-18 LAB — BASIC METABOLIC PANEL
Anion gap: 11 (ref 5–15)
BUN: 13 mg/dL (ref 6–20)
CO2: 23 mmol/L (ref 22–32)
Calcium: 9.5 mg/dL (ref 8.9–10.3)
Chloride: 105 mmol/L (ref 98–111)
Creatinine, Ser: 0.94 mg/dL (ref 0.44–1.00)
GFR, Estimated: >60 mL/min (ref >60)
Glucose, Bld: 93 mg/dL (ref 70–99)
Potassium: 3.8 mmol/L (ref 3.5–5.1)
Sodium: 139 mmol/L (ref 135–145)

## 2021-05-18 LAB — I-STAT BETA HCG BLOOD, ED (MC, WL, AP ONLY): I-stat hCG, quantitative: <5 m[IU]/mL (ref <5)

## 2021-05-18 LAB — TROPONIN I (HIGH SENSITIVITY)
Troponin I (High Sensitivity): 4 ng/L (ref <18)
Troponin I (High Sensitivity): 3 ng/L (ref <18)

## 2021-05-18 NOTE — ED Triage Notes (Signed)
Patient states after performing chest compressions at approximately 1000 this morning patient complains of pinching in left chest and left arm afterward.. Patient also reports significant anxiety and hypertension without history. Patient took 325 mg ASA PTA to [facility]. Patient alert, oriented, and anxious at this time.

## 2021-05-18 NOTE — ED Provider Triage Note (Signed)
Emergency Medicine Provider Triage Evaluation Note  Brittany Cherry , a 51 y.o. female  was evaluated in triage.  Pt complains of chest pain.  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  Patient states that since then she has been having intermittent chest pain.  Pain is located to the left side of her chest described as a pinching sensation.  Pain lasts for a few seconds to minutes.  Patient has also felt similar pain to left arm and left shoulder blade.  Patient endorses anxiety, nausea, dizziness, and shortness of breath.  Review of Systems  Positive: Chest pain, nausea, shortness of breath, dizziness, Negative: Leg swelling or tenderness, hemoptysis, palpitations, vomiting, syncope  Physical Exam  BP (!) 158/92 (BP Location: Right Arm)    Pulse (!) 106    Temp 98.2 F (36.8 C)    Resp 15    SpO2 100%  Gen:   Awake, no distress, patient is visibly anxious Resp:  Normal effort, clear to auscultation bilaterally MSK:   Moves extremities without difficulty Other:  +2 radial pulse bilaterally  Medical Decision Making  Medically screening exam initiated at 2:39 PM.  Appropriate orders placed.  Brain Hilts was informed that the remainder of the evaluation will be completed by another provider, this initial triage assessment does not replace that evaluation, and the importance of remaining in the ED until their evaluation is complete.  Patient reports taking 324 mg aspirin prior to arrival.  ACS work-up initiated.   Loni Beckwith, PA-C 05/18/21 1440

## 2021-05-19 MED ORDER — NAPROXEN 250 MG PO TABS
500.0000 mg | ORAL_TABLET | Freq: Once | ORAL | Status: AC
  Administered 2021-05-19: 500 mg via ORAL
  Filled 2021-05-19: qty 2

## 2021-05-19 MED ORDER — IBUPROFEN 600 MG PO TABS: 600.0000 mg | ORAL_TABLET | Freq: Four times a day (QID) | ORAL | 0 refills | Status: DC | PRN

## 2021-05-19 NOTE — ED Provider Notes (Signed)
[facility] EMERGENCY DEPARTMENT Provider Note   CSN: [id number] Arrival date & time: 05/18/21  1403     History  Chief Complaint  Patient presents with   Chest Pain    Brittany Cherry is a 51 y.o. female.  HPI     This is a 51 year old female who presents with chest pain.  Reports history of hyperlipidemia.  Was at work yesterday when she began to have some left arm pain.  Several hours later she began to have pain mostly in the left side of her chest which was pressure-like.  She states that it progressed and she began to feel dizzy and hot.  She took her blood pressure in both arms and it was elevated.  She does not have any known history of hypertension.  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  However, when she went over for chest x-ray she noted increasing pain with removing her arm out of her short.  She has not taken anything for the pain.  No fever, cough, recent illnesses.  She denies shortness of breath.  She was concerned given her elevated blood pressure.  Home Medications Prior to Admission medications   Medication Sig Start Date End Date Taking? Authorizing Provider  ibuprofen (ADVIL) 600 MG tablet Take 1 tablet (600 mg total) by mouth every 6 (six) hours as needed. 05/19/21  Yes Adriann Ballweg, Barbette Hair, MD  Aspirin-Salicylamide-Caffeine (BC HEADACHE PO) Take 1 packet by mouth daily as needed (pain).    [provider]  ferrous gluconate (FERGON) 324 MG tablet Take 1 tablet (324 mg total) daily with breakfast by mouth. 02/27/17   Burgess Estelle, MD  naproxen (NAPROSYN) 500 MG tablet Take 1 tablet (500 mg total) by mouth 2 (two) times daily. 10/26/18   Zigmund Gottron, NP  omeprazole (PRILOSEC) 20 MG capsule Take 1 capsule (20 mg total) by mouth daily. 10/26/18   Zigmund Gottron, NP  ondansetron (ZOFRAN ODT) 4 MG disintegrating tablet Take 1 tablet (4 mg total) by mouth  every 8 (eight) hours as needed for nausea or vomiting. 12/25/20   Lucrezia Starch, MD  oxyCODONE (OXY IR/ROXICODONE) 5 MG immediate release tablet Take 1 tablet (5 mg total) by mouth every 6 (six) hours as needed for severe pain. 04/08/20   Dwan Bolt, MD  pantoprazole (PROTONIX) 40 MG tablet Take 40 mg by mouth daily.    [provider]  traZODone (DESYREL) 50 MG tablet Take 50 mg by mouth at bedtime as needed for sleep.    [provider]      Allergies    Ativan [lorazepam]    Review of Systems   Review of Systems  Constitutional:  Negative for fever.  Respiratory:  Positive for chest tightness. Negative for cough and shortness of breath.   Cardiovascular:  Positive for chest pain. Negative for leg swelling.  All other systems reviewed and are negative.  Physical Exam Updated Vital Signs BP 125/84    Pulse 73    Temp 98 F (36.7 C) (Oral)    Resp 19    SpO2 100%  Physical Exam Vitals and nursing note reviewed.  Constitutional:      Appearance: She is well-developed. She is obese. She is not ill-appearing.  HENT:     Head: Normocephalic and atraumatic.  Eyes:     Pupils: Pupils are equal, round, and reactive to light.  Cardiovascular:     Rate and Rhythm: Normal rate and regular rhythm.     Heart sounds: Normal heart sounds.  Pulmonary:     Effort: Pulmonary effort is normal. No respiratory distress.     Breath sounds: No wheezing.  Chest:     Chest wall: Tenderness present.  Abdominal:     Palpations: Abdomen is soft.  Musculoskeletal:     Cervical back: Neck supple.     Right lower leg: No edema.     Left lower leg: No edema.  Skin:    General: Skin is warm and dry.  Neurological:     Mental Status: She is alert and oriented to person, place, and time.  Psychiatric:        Mood and Affect: Mood normal.    ED Results / Procedures / Treatments   Labs (all labs ordered are listed, but only abnormal results are displayed) Labs Reviewed   CBC WITH DIFFERENTIAL/PLATELET - Abnormal; Notable for the following components:      Result Value   MCV 79.4 (*)    All other components within normal limits  BASIC METABOLIC PANEL  I-STAT BETA HCG BLOOD, ED ([facility], [facility], [facility] ONLY)  TROPONIN I (HIGH SENSITIVITY)  TROPONIN I (HIGH SENSITIVITY)    EKG EKG Interpretation  Date/Time:  Tuesday May 18 2021 14:21:59 EST Ventricular Rate:  98 PR Interval:  142 QRS Duration: 80 QT Interval:  342 QTC Calculation: 436 R Axis:   6 Text Interpretation: Normal sinus rhythm Normal ECG When compared with ECG of 16-Jun-2020 08:59, PREVIOUS ECG IS PRESENT Confirmed by Thayer Jew [phone]) on 05/19/2021 3:28:08 AM  Radiology DG Chest 2 View  Result Date: 05/18/2021 CLINICAL DATA:  Chest pain EXAM: CHEST - 2 VIEW COMPARISON:  2020 FINDINGS: The heart size and mediastinal contours are within normal limits. Both lungs are clear. No pleural effusion or pneumothorax. The visualized skeletal structures are unremarkable. IMPRESSION: No active cardiopulmonary disease. Electronically Signed   By: Macy Mis M.D.   On: 05/18/2021 15:32    Procedures Procedures    Medications Ordered in ED Medications  naproxen (NAPROSYN) tablet 500 mg (has no administration in time range)    ED Course/ Medical Decision Making/ A&P                           Medical Decision Making Risk Prescription drug management.   This patient presents to the ED for concern of chest pain, this involves an extensive number of treatment options, and is a complaint that carries with it a high risk of complications and morbidity.  The differential diagnosis includes ACS, PE, musculoskeletal, pneumonia, pneumothorax  MDM:    This is a 51 year old female who presents with chest pain.  She is nontoxic and vital signs initially notable for blood pressure 158/92.  This settled to 125/84.  She is currently asymptomatic.  She has some reproducible chest wall pain.  She is fairly low  risk for ACS with heart score of 1.  EKG is completely normal and troponin x2 negative.  Doubt ACS.  She is PERC neg. and doubt PE.  Chest x-ray does not show any pneumothorax or pneumonia.  Given reproducibility of pain, pain with removal of her arm from her shirt, and recent administration of CPR, suspect potential musculoskeletal etiology.  Will trial with anti-inflammatories.  Recommend follow-up with her primary physician regarding her blood pressure.  Additionally she was provided with cardiology outpatient  follow-up.  Patient is agreeable to plan. (Labs, imaging)  Labs: I Ordered, and personally interpreted labs.  The pertinent results include: Normal troponin, otherwise normal lab  Imaging Studies ordered: I ordered imaging studies including chest x-ray without pneumothorax or pneumonia I independently visualized and interpreted imaging. I agree with the radiologist interpretation  Additional history obtained from chart review.  External records from outside source obtained and reviewed including prior visits  Critical Interventions: None  Consultations: I requested consultation with the none,  and discussed lab and imaging findings as well as pertinent plan - they recommend: None  Cardiac Monitoring: The patient was maintained on a cardiac monitor.  I personally viewed and interpreted the cardiac monitored which showed an underlying rhythm of: Normal sinus rhythm  Reevaluation: After the interventions noted above, I reevaluated the patient and found that they have :improved   Considered admission for: Chest pain  Social Determinants of Health: Lives independently with husband  Disposition: Discharge  Co morbidities that complicate the patient evaluation  Past Medical History:  Diagnosis Date   AMA (advanced maternal age) multigravida 35+    Anemia    Depression    GERD (gastroesophageal reflux disease)    Headache    Hyperemesis gravidarum      Medicines Meds  ordered this encounter  Medications   naproxen (NAPROSYN) tablet 500 mg   ibuprofen (ADVIL) 600 MG tablet    Sig: Take 1 tablet (600 mg total) by mouth every 6 (six) hours as needed.    Dispense:  30 tablet    Refill:  0    I have reviewed the patients home medicines and have made adjustments as needed  Problem List / ED Course: Problem List Items Addressed This Visit   None Visit Diagnoses     Atypical chest pain    -  Primary                   Final Clinical Impression(s) / ED Diagnoses Final diagnoses:  Atypical chest pain    Rx / DC Orders ED Discharge Orders          Ordered    ibuprofen (ADVIL) 600 MG tablet  Every 6 hours PRN        05/19/21 0420              Merryl Hacker, MD 05/19/21 [phone]

## 2021-05-19 NOTE — Discharge Instructions (Signed)
You were seen today for chest pain.  Your work-up is reassuring.  This could be related to chest wall or musculoskeletal pain.  Follow-up with your primary doctor for blood pressure recheck.  You may also follow-up with cardiology for formal cardiology evaluation.

## 2021-09-03 DIAGNOSIS — I10 Essential (primary) hypertension: Secondary | ICD-10-CM | POA: Insufficient documentation

## 2021-09-03 DIAGNOSIS — N951 Menopausal and female climacteric states: Secondary | ICD-10-CM | POA: Insufficient documentation

## 2021-10-08 ENCOUNTER — Other Ambulatory Visit: Payer: Self-pay | Admitting: Physician Assistant

## 2021-10-08 DIAGNOSIS — Z0000 Encounter for general adult medical examination without abnormal findings: Secondary | ICD-10-CM

## 2021-10-08 DIAGNOSIS — Z1231 Encounter for screening mammogram for malignant neoplasm of breast: Secondary | ICD-10-CM

## 2022-03-07 ENCOUNTER — Emergency Department (HOSPITAL_BASED_OUTPATIENT_CLINIC_OR_DEPARTMENT_OTHER): Payer: Self-pay

## 2022-03-07 ENCOUNTER — Emergency Department (HOSPITAL_BASED_OUTPATIENT_CLINIC_OR_DEPARTMENT_OTHER)
Admission: EM | Admit: 2022-03-07 | Discharge: 2022-03-07 | Disposition: A | Discharge status: Home / Self Care | Payer: Self-pay | Attending: Emergency Medicine | Admitting: Emergency Medicine

## 2022-03-07 ENCOUNTER — Encounter (HOSPITAL_BASED_OUTPATIENT_CLINIC_OR_DEPARTMENT_OTHER): Payer: Self-pay | Admitting: Emergency Medicine

## 2022-03-07 ENCOUNTER — Other Ambulatory Visit: Payer: Self-pay

## 2022-03-07 DIAGNOSIS — M25561 Pain in right knee: Secondary | ICD-10-CM | POA: Insufficient documentation

## 2022-03-07 DIAGNOSIS — M79604 Pain in right leg: Secondary | ICD-10-CM | POA: Insufficient documentation

## 2022-03-07 DIAGNOSIS — M25562 Pain in left knee: Secondary | ICD-10-CM | POA: Insufficient documentation

## 2022-03-07 NOTE — Discharge Instructions (Signed)
Doppler studies of the right leg without evidence of deep vein thrombosis or superficial vein thrombosis.  Recommend following up with your primary care doctor.  Return for any new or worse symptoms.

## 2022-03-07 NOTE — ED Triage Notes (Signed)
Pt arrives to ED with c/o right sided leg pain, swelling.

## 2022-03-07 NOTE — ED Provider Notes (Signed)
[facility] EMERGENCY DEPT Provider Note   CSN: [id number] Arrival date & time: 03/07/22  4034     History  Chief Complaint  Patient presents with   Leg Pain    Brittany Cherry is a 51 y.o. female.  Patient with a complaint of some right leg calf pain for several weeks.  Patient concerned about a deep vein thrombosis after discussion with her sister.  Patient did see her primary care doctor several days ago for this.  At that time most the discomfort was the lateral aspect of the calf area.  Patient does have a history of bilateral knee pain.  He says this feels different.  She will get leg swelling.  But currently no swelling at the ankle area.  Pain is somewhat diffuse kind of below the knee calf area.  At with a history of gastroesophageal reflux disease.  Patient's former smoker quit in 2018.  [facility].       Home Medications Prior to Admission medications   Medication Sig Start Date End Date Taking? Authorizing Provider  Aspirin-Salicylamide-Caffeine (BC HEADACHE PO) Take 1 packet by mouth daily as needed (pain).    [provider]  ferrous gluconate (FERGON) 324 MG tablet Take 1 tablet (324 mg total) daily with breakfast by mouth. 02/27/17   Burgess Estelle, MD  ibuprofen (ADVIL) 600 MG tablet Take 1 tablet (600 mg total) by mouth every 6 (six) hours as needed. 05/19/21   Horton, Barbette Hair, MD  naproxen (NAPROSYN) 500 MG tablet Take 1 tablet (500 mg total) by mouth 2 (two) times daily. 10/26/18   Zigmund Gottron, NP  omeprazole (PRILOSEC) 20 MG capsule Take 1 capsule (20 mg total) by mouth daily. 10/26/18   Zigmund Gottron, NP  ondansetron (ZOFRAN ODT) 4 MG disintegrating tablet Take 1 tablet (4 mg total) by mouth every 8 (eight) hours as needed for nausea or vomiting. 12/25/20   Lucrezia Starch, MD  oxyCODONE (OXY IR/ROXICODONE) 5 MG immediate release tablet Take 1 tablet (5 mg total) by mouth every 6 (six) hours as  needed for severe pain. 04/08/20   Dwan Bolt, MD  pantoprazole (PROTONIX) 40 MG tablet Take 40 mg by mouth daily.    [provider]  traZODone (DESYREL) 50 MG tablet Take 50 mg by mouth at bedtime as needed for sleep.    [provider]      Allergies    Ativan [lorazepam]    Review of Systems   Review of Systems  Constitutional:  Negative for chills and fever.  HENT:  Negative for ear pain and sore throat.   Eyes:  Negative for pain and visual disturbance.  Respiratory:  Negative for cough and shortness of breath.   Cardiovascular:  Negative for chest pain and palpitations.  Gastrointestinal:  Negative for abdominal pain and vomiting.  Genitourinary:  Negative for dysuria and hematuria.  Musculoskeletal:  Negative for arthralgias, back pain, joint swelling, myalgias, neck pain and neck stiffness.  Skin:  Negative for color change and rash.  Neurological:  Negative for seizures, syncope, weakness and numbness.  All other systems reviewed and are negative.   Physical Exam Updated Vital Signs BP (!) 142/73 (BP Location: Left Arm)   Pulse 75   Temp (!) 97.5 F (36.4 C)   Resp 20   Ht 1.575 m ('5\' 2"'$ )   Wt 97.5 kg   SpO2 100%   BMI 39.32 kg/m  Physical Exam Vitals and  nursing note reviewed.  Constitutional:      General: She is not in acute distress.    Appearance: Normal appearance. She is well-developed.  HENT:     Head: Normocephalic and atraumatic.  Eyes:     Extraocular Movements: Extraocular movements intact.     Conjunctiva/sclera: Conjunctivae normal.     Pupils: Pupils are equal, round, and reactive to light.  Cardiovascular:     Rate and Rhythm: Normal rate and regular rhythm.     Heart sounds: No murmur heard. Pulmonary:     Effort: Pulmonary effort is normal. No respiratory distress.     Breath sounds: Normal breath sounds.  Abdominal:     Palpations: Abdomen is soft.     Tenderness: There is no abdominal tenderness. There is no  guarding.  Musculoskeletal:        General: Tenderness present. No swelling or signs of injury.     Cervical back: Normal range of motion and neck supple.     Right lower leg: No edema.     Left lower leg: No edema.     Comments: Right leg with no swelling at the foot or ankle.  No joint line knee pain no evidence of effusion.  Patient kind with tenderness below the knee area to the mid calf area.  No discoloration no erythema.  Distally dorsalis pedis pulses 2+ sensations intact.  Skin:    General: Skin is warm and dry.     Capillary Refill: Capillary refill takes less than 2 seconds.  Neurological:     General: No focal deficit present.     Mental Status: She is alert and oriented to person, place, and time.     Cranial Nerves: No cranial nerve deficit.     Sensory: No sensory deficit.     Motor: No weakness.  Psychiatric:        Mood and Affect: Mood normal.     ED Results / Procedures / Treatments   Labs (all labs ordered are listed, but only abnormal results are displayed) Labs Reviewed - No data to display  EKG None  Radiology US Venous Img Lower Unilateral Right  Result Date: 03/07/2022 CLINICAL DATA:  Right leg pain for 2 months EXAM: RIGHT LOWER EXTREMITY VENOUS DOPPLER ULTRASOUND TECHNIQUE: Gray-scale sonography with graded compression, as well as color Doppler and duplex ultrasound were performed to evaluate the lower extremity deep venous systems from the level of the common femoral vein and including the common femoral, femoral, profunda femoral, popliteal and calf veins including the posterior tibial, peroneal and gastrocnemius veins when visible. The superficial great saphenous vein was also interrogated. Spectral Doppler was utilized to evaluate flow at rest and with distal augmentation maneuvers in the common femoral, femoral and popliteal veins. COMPARISON:  None Available. FINDINGS: Contralateral Common Femoral Vein: Respiratory phasicity is normal and symmetric  with the symptomatic side. No evidence of thrombus. Normal compressibility. Common Femoral Vein: No evidence of thrombus. Normal compressibility, respiratory phasicity and response to augmentation. Saphenofemoral Junction: No evidence of thrombus. Normal compressibility and flow on color Doppler imaging. Profunda Femoral Vein: No evidence of thrombus. Normal compressibility and flow on color Doppler imaging. Femoral Vein: No evidence of thrombus. Normal compressibility, respiratory phasicity and response to augmentation. Popliteal Vein: No evidence of thrombus. Normal compressibility, respiratory phasicity and response to augmentation. Calf Veins: No evidence of thrombus. Normal compressibility and flow on color Doppler imaging. Superficial Great Saphenous Vein: No evidence of thrombus. Normal compressibility. IMPRESSION: No evidence of  deep venous thrombosis. Electronically Signed   By: Jerilynn Mages.  Shick M.D.   On: 03/07/2022 08:42    Procedures Procedures    Medications Ordered in ED Medications - No data to display  ED Course/ Medical Decision Making/ A&P                           Medical Decision Making  Workup here Doppler studies are negative.  Patient mostly was concerned about deep vein thrombosis.  Her primary care doctor has been assisting with leg pain.  Doppler study rules out deep vein thrombosis as well as superficial vein thrombosis.  Patient states the pain is different than her chronic knee pain.  Will treat symptomatically.  Patient stable for discharge home and follow-up with primary care provider.   Final Clinical Impression(s) / ED Diagnoses Final diagnoses:  Right leg pain    Rx / DC Orders ED Discharge Orders     None         Fredia Sorrow, MD 03/07/22 [phone]

## 2022-07-07 ENCOUNTER — Telehealth: Payer: Self-pay | Admitting: Gastroenterology

## 2022-07-07 NOTE — Telephone Encounter (Signed)
Dr. Lyndel Safe,  We received a referral for patient to have a colonoscopy.  Last colon was at [facility] in 2018 (records in [place]).  Please review and advise if patient is due for a recall colon.  Thanks Christy  Dr. Lyndel Safe DOD 3/28/24AM

## 2022-07-10 NOTE — Telephone Encounter (Signed)
Last colonoscopy from 2018 reviewed-it was recommended to repeat colonoscopy in 3 months at that time due to quality of prep and polyp  Plan: -Proceed with colon with 2-day prep RG

## 2022-07-27 NOTE — Telephone Encounter (Signed)
Called patient to schedule she answered and then hung up the phone.

## 2022-11-30 ENCOUNTER — Ambulatory Visit (HOSPITAL_BASED_OUTPATIENT_CLINIC_OR_DEPARTMENT_OTHER): Payer: Managed Care, Other (non HMO) | Admitting: Family Medicine

## 2022-11-30 ENCOUNTER — Encounter (HOSPITAL_BASED_OUTPATIENT_CLINIC_OR_DEPARTMENT_OTHER): Payer: Self-pay | Admitting: Family Medicine

## 2022-11-30 VITALS — BP 142/98 | HR 83 | Ht 62.0 in | Wt 212.0 lb

## 2022-11-30 DIAGNOSIS — Z7689 Persons encountering health services in other specified circumstances: Secondary | ICD-10-CM

## 2022-11-30 DIAGNOSIS — R21 Rash and other nonspecific skin eruption: Secondary | ICD-10-CM | POA: Insufficient documentation

## 2022-11-30 DIAGNOSIS — M79605 Pain in left leg: Secondary | ICD-10-CM | POA: Diagnosis not present

## 2022-11-30 DIAGNOSIS — M79604 Pain in right leg: Secondary | ICD-10-CM | POA: Diagnosis not present

## 2022-11-30 DIAGNOSIS — I10 Essential (primary) hypertension: Secondary | ICD-10-CM

## 2022-11-30 MED ORDER — HYDRALAZINE HCL 10 MG PO TABS: 10.0000 mg | ORAL_TABLET | Freq: Two times a day (BID) | ORAL | 0 refills | Status: AC | PRN

## 2022-11-30 NOTE — Progress Notes (Signed)
New Patient Office Visit  Subjective:   Brittany Cherry Oct 06, 1970 11/30/2022  Chief Complaint  Patient presents with   New Patient (Initial Visit)    Pt is here today to get established with the practice. States that she has had problems with an outbreak on her legs that did go away but then is beginning to come back. Also states her feet and toes have been bothering her.    HPI: Brittany Cherry presents today to establish care at [facility] at [facility]. Introduced to Publishing rights manager role and practice setting.  All questions answered.   Last PCP: [facility]  Last annual physical: January 2024 Concerns: See below   LEG CONCERN:  She states there is "tingling" and pain present to bilateral lower extremities. States pain is worse at nighttime. She reports increased pain and sensitivity to her toes even with a bedsheet lying on them. She states it feels like a "cool sensation on the inside". She does wear compression stockings to work. Denies symptoms of restless legs. Describes pain as "stabbing" . She is not diabetic. She does have swelling that is dependent and it resolves by morning.   RASH:  Reports onset in June 2024. States rash was gradual to bilateral lower extremities. She had basic allergy testing and placed on Prednisone by her prior PCP. She was tested for lyme and did not have results given to her. She initially thought it was a drug rash and stopped all OTC medications for the past 3  weeks. Rash did heal with prednisone. Reports it is a purpura like rash. Denies itching or drainage.   HYPERTENSION: Brittany Cherry presents for the medical management of hypertension.  Patient's current hypertension medication regimen is: Amlodipine 10mg , hydrochlorothiazide 25mg  Patient is  currently taking prescribed medications for HTN.  Patient is  regularly keeping a check on BP at home.  Adhering to low sodium diet:  yes Exercising Regularly: yes Denies headache, dizziness, CP, SHOB, vision changes.   BP Readings from Last 3 Encounters:  11/30/22 (!) 142/98  03/07/22 106/67  05/19/21 125/84     The following portions of the patient's history were reviewed and updated as appropriate: past medical history, past surgical history, family history, social history, allergies, medications, and problem list.   Patient Active Problem List   Diagnosis Date Noted   Rash and other nonspecific skin eruption 11/30/2022   Bilateral leg pain 11/30/2022   Hypertension 09/03/2021   Peri-menopausal 09/03/2021   Primary osteoarthritis of both knees 06/22/2018   Acute anemia 02/26/2017   Chronic pain of both knees 06/07/2016   S/P tubal ligation 08/20/2013   Past Medical History:  Diagnosis Date   AMA (advanced maternal age) multigravida 35+    Anemia    Depression    GERD (gastroesophageal reflux disease)    Headache    Hyperemesis gravidarum    Hypertension    Past Surgical History:  Procedure Laterality Date   CHOLECYSTECTOMY N/A 04/08/2020   Procedure: LAPAROSCOPIC CHOLECYSTECTOMY;  Surgeon: Fritzi Mandes, MD;  Location: [facility] OR;  Service: General;  Laterality: N/A;   COLONOSCOPY WITH PROPOFOL N/A 02/27/2017   Procedure: COLONOSCOPY WITH PROPOFOL;  Surgeon: Kerin Salen, MD;  Location: [facility] ENDOSCOPY;  Service: Gastroenterology;  Laterality: N/A;   DILATION AND CURETTAGE OF UTERUS     ESOPHAGOGASTRODUODENOSCOPY (EGD) WITH PROPOFOL N/A 02/27/2017   Procedure: ESOPHAGOGASTRODUODENOSCOPY (EGD) WITH PROPOFOL;  Surgeon: Kerin Salen, MD;  Location: [facility] ENDOSCOPY;  Service: Gastroenterology;  Laterality: N/A;   KNEE ARTHROSCOPY Bilateral    TUBAL LIGATION Bilateral 08/18/2013   Procedure: POST PARTUM TUBAL LIGATION;  Surgeon: Lazaro Arms, MD;  Location: [facility] ORS;  Service: Gynecology;  Laterality: Bilateral;   WISDOM TOOTH EXTRACTION     Family History  Problem Relation Age of Onset   Hypertension Mother     Hyperlipidemia Mother    Pancreatic cancer Maternal Aunt    Breast cancer Maternal Grandmother    Leukemia Paternal Grandfather    Social History   Socioeconomic History   Marital status: Married    Spouse name: Not on file   Number of children: Not on file   Years of education: Not on file   Highest education level: Not on file  Occupational History   Not on file  Tobacco Use   Smoking status: Former    Current packs/day: 0.00    Types: Cigarettes    Quit date: 09/09/2016    Years since quitting: 6.2   Smokeless tobacco: Never  Vaping Use   Vaping status: Never Used  Substance and Sexual Activity   Alcohol use: Yes    Comment: occasionally   Drug use: No   Sexual activity: Yes  Other Topics Concern   Not on file  Social History Narrative   Not on file   Social Determinants of Health   Financial Resource Strain: Not on File (09/03/2021)   Received from [employer], [employer]    Financial Resource Strain: 0  Food Insecurity: Not on File (09/03/2021)   Received from [place], [employer] Insecurity    Food: 0  Transportation Needs: Not on File (09/03/2021)   Received from [employer], [employer] Needs    Transportation: 0  Physical Activity: Not on File (09/03/2021)   Received from [place], [place]   Physical Activity    Physical Activity: 0  Stress: Not on File (09/03/2021)   Received from [facility], [place]   Stress    Stress: 0  Social Connections: Not on File (09/03/2021)   Received from [place], [place]   Social Connections    Social Connections and Isolation: 0  Intimate Partner Violence: Not on file   Outpatient Medications Prior to Visit  Medication Sig Dispense Refill   amLODipine (NORVASC) 10 MG tablet Take 10 mg by mouth daily.     busPIRone (BUSPAR) 7.5 MG tablet Take 7.5 mg by mouth 2 (two) times daily.     hydrochlorothiazide (HYDRODIURIL) 25 MG tablet Take 25 mg by mouth daily.     Aspirin-Salicylamide-Caffeine (BC HEADACHE PO) Take 1  packet by mouth daily as needed (pain).     ferrous gluconate (FERGON) 324 MG tablet Take 1 tablet (324 mg total) daily with breakfast by mouth. 30 tablet 3   ibuprofen (ADVIL) 600 MG tablet Take 1 tablet (600 mg total) by mouth every 6 (six) hours as needed. 30 tablet 0   naproxen (NAPROSYN) 500 MG tablet Take 1 tablet (500 mg total) by mouth 2 (two) times daily. 30 tablet 0   omeprazole (PRILOSEC) 20 MG capsule Take 1 capsule (20 mg total) by mouth daily. 30 capsule 0   ondansetron (ZOFRAN ODT) 4 MG disintegrating tablet Take 1 tablet (4 mg total) by mouth every 8 (eight) hours as needed for nausea or vomiting. 20 tablet 0   oxyCODONE (OXY IR/ROXICODONE) 5 MG immediate release tablet Take 1 tablet (5 mg total) by mouth every 6 (six) hours as needed for severe  pain. 15 tablet 0   pantoprazole (PROTONIX) 40 MG tablet Take 40 mg by mouth daily.     traZODone (DESYREL) 50 MG tablet Take 50 mg by mouth at bedtime as needed for sleep.     No facility-administered medications prior to visit.   Allergies  Allergen Reactions   Ativan [Lorazepam] Itching    Feet, Hands, Face    ROS: A complete ROS was performed with pertinent positives/negatives noted in the HPI. The remainder of the ROS are negative.   Objective:   Today's Vitals   11/30/22 0837 11/30/22 0912  BP: (!) 147/98 (!) 142/98  Pulse: 83   SpO2: 99%   Weight: 212 lb (96.2 kg)   Height: 5\' 2"  (1.575 m)     GENERAL: Well-appearing, in NAD. Well nourished.  SKIN: Pink, warm and dry. Mild macular red rash present to bilateral thighs with very few (less than 5 bumps) bilaterally. No drainage, edema, or bleeding.  Head: Normocephalic. NECK: Trachea midline. Full ROM w/o pain or tenderness. No lymphadenopathy.  RESPIRATORY: Chest wall symmetrical. Respirations even and non-labored.   MSK: Muscle tone and strength appropriate for age. Joints w/o tenderness, redness, or swelling.  EXTREMITIES: Without clubbing, cyanosis. Mild bilateral  lower extremity edema +1, non pitting.  NEUROLOGIC: No motor or sensory deficits. Steady, even gait. C2-C12 intact.  PSYCH/MENTAL STATUS: Alert, oriented x 3. Cooperative, appropriate mood and affect.   No results found for any visits on 11/30/22.     Assessment & Plan:  1. Encounter to establish care with new doctor Will obtain labs this morning with establishment of care to assess for specific causes of pain including diabetic neuropathy, coagulation disorders, and management of HTN in regards to electrolyte and renal function.   - Lipid panel - Comprehensive metabolic panel - CBC with Differential/Platelet - Hemoglobin A1c - TSH  2. Rash and other nonspecific skin eruption 3. Bilateral leg pain Obtain the following labs to check for Tick related illness, inflammatory conditions including gout, autoimmune, and evaluate for electrolyte or vitamin deficiency. Pending lab results, may order [place] bilateral lower extremities to check for claudication, venous or arterial disease.   - Alpha-Gal Panel - Ehrlichia antibody panel - Lyme Disease Serology w/Reflex - Rocky mtn spotted fvr abs pnl(IgG+IgM) - ANA - Sedimentation rate - Vitamin B12 - Vitamin B1 - Iron, TIBC and Ferritin Panel - Uric acid - Spotted Fever Group Antibodies  4. Primary hypertension Current not controlled. Pt is traveling this week out of state and does not want to change medications while traveling. Will return in 2 weeks to discuss change. She is exercising, monitoring salt intake and checking BP regularly. Will use hydralazine 10mg  BID PRN if BP over 150/90 while traveling.    Patient to reach out to office if new, worrisome, or unresolved symptoms arise or if no improvement in patient's condition. Patient verbalized understanding and is agreeable to treatment plan. All questions answered to patient's satisfaction.    Return in about 2 weeks (around 12/14/2022) for HYPERTENSION, Chronic Condition Follow up .    Of note, portions of this note may have been created with voice recognition software Physicist, medical). While this note has been edited for accuracy, occasional wrong-word or 'sound-a-like' substitutions may have occurred due to the inherent limitations of voice recognition software.  Yolanda Manges, FNP

## 2022-12-05 LAB — CBC WITH DIFFERENTIAL/PLATELET
Basophils Absolute: 0 10*3/uL (ref 0.0–0.2)
Basos: 1 %
EOS (ABSOLUTE): 0.2 10*3/uL (ref 0.0–0.4)
Eos: 4 %
Hematocrit: 41.9 % (ref 34.0–46.6)
Hemoglobin: 14 g/dL (ref 11.1–15.9)
Immature Grans (Abs): 0 10*3/uL (ref 0.0–0.1)
Immature Granulocytes: 0 %
Lymphocytes Absolute: 1.2 10*3/uL (ref 0.7–3.1)
Lymphs: 21 %
MCH: 28.1 pg (ref 26.6–33.0)
MCHC: 33.4 g/dL (ref 31.5–35.7)
MCV: 84 fL (ref 79–97)
Monocytes Absolute: 0.4 10*3/uL (ref 0.1–0.9)
Monocytes: 7 %
Neutrophils Absolute: 3.7 10*3/uL (ref 1.4–7.0)
Neutrophils: 67 %
Platelets: 355 10*3/uL (ref 150–450)
RBC: 4.99 x10E6/uL (ref 3.77–5.28)
RDW: 13.9 % (ref 11.7–15.4)
WBC: 5.5 10*3/uL (ref 3.4–10.8)

## 2022-12-05 LAB — COMPREHENSIVE METABOLIC PANEL
ALT: 17 IU/L (ref 0–32)
AST: 13 IU/L (ref 0–40)
Albumin: 4.5 g/dL (ref 3.9–4.9)
Alkaline Phosphatase: 95 IU/L (ref 44–121)
BUN/Creatinine Ratio: 10 (ref 9–23)
BUN: 9 mg/dL (ref 6–24)
Bilirubin Total: 0.5 mg/dL (ref 0.0–1.2)
CO2: 26 mmol/L (ref 20–29)
Calcium: 9.6 mg/dL (ref 8.7–10.2)
Chloride: 96 mmol/L (ref 96–106)
Creatinine, Ser: 0.87 mg/dL (ref 0.57–1.00)
Globulin, Total: 2.5 g/dL (ref 1.5–4.5)
Glucose: 93 mg/dL (ref 70–99)
Potassium: 3.7 mmol/L (ref 3.5–5.2)
Sodium: 139 mmol/L (ref 134–144)
Total Protein: 7 g/dL (ref 6.0–8.5)
eGFR: 81 mL/min/{1.73_m2} (ref >59)

## 2022-12-05 LAB — EHRLICHIA ANTIBODY PANEL
E. Chaffeensis (HME) IgM Titer: NEGATIVE
E.Chaffeensis (HME) IgG: NEGATIVE
HGE IgG Titer: NEGATIVE
HGE IgM Titer: NEGATIVE

## 2022-12-05 LAB — LIPID PANEL
Chol/HDL Ratio: 5 ratio — ABNORMAL HIGH (ref 0.0–4.4)
Cholesterol, Total: 213 mg/dL — ABNORMAL HIGH (ref 100–199)
HDL: 43 mg/dL (ref >39)
LDL Chol Calc (NIH): 153 mg/dL — ABNORMAL HIGH (ref 0–99)
Triglycerides: 94 mg/dL (ref 0–149)
VLDL Cholesterol Cal: 17 mg/dL (ref 5–40)

## 2022-12-05 LAB — IRON,TIBC AND FERRITIN PANEL
Ferritin: 71 ng/mL (ref 15–150)
Iron Saturation: 13 % — ABNORMAL LOW (ref 15–55)
Iron: 47 ug/dL (ref 27–159)
Total Iron Binding Capacity: 355 ug/dL (ref 250–450)
UIBC: 308 ug/dL (ref 131–425)

## 2022-12-05 LAB — SEDIMENTATION RATE: Sed Rate: 8 mm/h (ref 0–40)

## 2022-12-05 LAB — TSH: TSH: 1.4 u[IU]/mL (ref 0.450–4.500)

## 2022-12-05 LAB — ALPHA-GAL PANEL
Allergen Lamb IgE: <0.1 kU/L
Beef IgE: <0.1 kU/L
IgE (Immunoglobulin E), Serum: 6 [IU]/mL (ref 6–495)
O215-IgE Alpha-Gal: <0.1 kU/L
Pork IgE: <0.1 kU/L

## 2022-12-05 LAB — VITAMIN B12: Vitamin B-12: 543 pg/mL (ref 232–1245)

## 2022-12-05 LAB — URIC ACID: Uric Acid: 6.8 mg/dL — ABNORMAL HIGH (ref 2.6–6.2)

## 2022-12-05 LAB — LYME DISEASE SEROLOGY W/REFLEX: Lyme Total Antibody EIA: NEGATIVE

## 2022-12-05 LAB — ANA: Anti Nuclear Antibody (ANA): NEGATIVE

## 2022-12-05 LAB — VITAMIN B1: Thiamine: 75 nmol/L (ref 66.5–200.0)

## 2022-12-05 LAB — HEMOGLOBIN A1C
Est. average glucose Bld gHb Est-mCnc: 111 mg/dL
Hgb A1c MFr Bld: 5.5 % (ref 4.8–5.6)

## 2022-12-06 NOTE — Progress Notes (Signed)
Hi Shelaine, Overall your labs look good.  Your cholesterol is elevated which we can discuss at your physical on September 6.  To decrease this have a good diet with omega-3 fats such as fish, nuts, beans, and lean meats such as [place] or chicken.  Avoiding fast food or fatty or greasy foods.  Exercising regularly each day.  Your iron overall looks good, continue to eat sources of good iron in your diet such as green leafy vegetables and fortified cereals and beings.  We are still waiting on the [facility] and tickborne illness lab (one of their agents that they use to complete this lab is on backorder).  Your other lab work did not reveal a cause of the pain or rash that you are having.  Your blood counts, A1c  and thyroid are normal.  Labs are negative for any type of alpha gal, Ehrlichia antibodies, Lyme disease, or autoimmune issue.  Your uric acid level was slightly elevated which possibly could indicate gout presence.  Are you still having this pain?  If so, we could try a as needed medication called colchicine to help with symptoms when they occur.  There is a daily medication called allopurinol if you feel like this is something that occurs on a daily basis.  Please let me know your thoughts

## 2022-12-12 LAB — SPOTTED FEVER GROUP ANTIBODIES
Spotted Fever Group IgG: <1:64 {titer}
Spotted Fever Group IgM: <1:64 {titer}

## 2022-12-13 ENCOUNTER — Encounter: Payer: Self-pay | Admitting: Pharmacist

## 2022-12-16 ENCOUNTER — Encounter (HOSPITAL_BASED_OUTPATIENT_CLINIC_OR_DEPARTMENT_OTHER): Payer: Self-pay | Admitting: Family Medicine

## 2022-12-16 ENCOUNTER — Ambulatory Visit (HOSPITAL_BASED_OUTPATIENT_CLINIC_OR_DEPARTMENT_OTHER): Payer: Managed Care, Other (non HMO) | Admitting: Family Medicine

## 2022-12-16 VITALS — BP 128/80 | HR 70 | Ht 62.0 in | Wt 212.7 lb

## 2022-12-16 DIAGNOSIS — I10 Essential (primary) hypertension: Secondary | ICD-10-CM | POA: Diagnosis not present

## 2022-12-16 DIAGNOSIS — M79604 Pain in right leg: Secondary | ICD-10-CM | POA: Diagnosis not present

## 2022-12-16 DIAGNOSIS — Z1211 Encounter for screening for malignant neoplasm of colon: Secondary | ICD-10-CM | POA: Diagnosis not present

## 2022-12-16 DIAGNOSIS — E782 Mixed hyperlipidemia: Secondary | ICD-10-CM

## 2022-12-16 DIAGNOSIS — Z1231 Encounter for screening mammogram for malignant neoplasm of breast: Secondary | ICD-10-CM | POA: Diagnosis not present

## 2022-12-16 DIAGNOSIS — M79605 Pain in left leg: Secondary | ICD-10-CM

## 2022-12-16 NOTE — Progress Notes (Signed)
Subjective:   Brittany Cherry 03/10/1971 12/16/2022  Chief Complaint  Patient presents with   Medical Management of Chronic Issues    2-week follow up; pt states she fell 2 weeks ago after her aunt's funeral down the stairs.    HPI: Brittany Cherry presents today for re-assessment and management of chronic medical conditions.  HYPERTENSION: [name] presents for the medical management of hypertension.  Patient's current hypertension medication regimen is: Amlodipine 10mg  , hydrochlorothiazide 25mg  (pt has not taken her medication this morning).  Patient is  currently taking prescribed medications for HTN.  Patient is  regularly keeping a check on BP at home. She states BP is lower when not at work. Average systolic is around 140 and diastolic upper 80's-90. She has not had to use the Hydralazine Adhering to low sodium diet: Yes Exercising Regularly: Yes Denies headache, dizziness, CP, SHOB, vision changes.   BP Readings from Last 3 Encounters:  12/16/22 128/80  11/30/22 (!) 142/98  03/07/22 106/67    LEG CONCERN:  Patient reports she also had a vein study performed outside of [facility] network and results were unremarkable. She states the swelling and rash is intermittent and appears more frequently with stress. Denies recent rash appearance. Denies warmth, joint swelling or itching.   The following portions of the patient's history were reviewed and updated as appropriate: past medical history, past surgical history, family history, social history, allergies, medications, and problem list.   Patient Active Problem List   Diagnosis Date Noted   Rash and other nonspecific skin eruption 11/30/2022   Bilateral leg pain 11/30/2022   Hypertension 09/03/2021   Peri-menopausal 09/03/2021   Primary osteoarthritis of both knees 06/22/2018   Acute anemia 02/26/2017   Chronic pain of both knees 06/07/2016   S/P tubal ligation 08/20/2013   Past Medical History:   Diagnosis Date   AMA (advanced maternal age) multigravida 35+    Anemia    Depression    GERD (gastroesophageal reflux disease)    Headache    Hyperemesis gravidarum    Hypertension    Past Surgical History:  Procedure Laterality Date   CHOLECYSTECTOMY N/A 04/08/2020   Procedure: LAPAROSCOPIC CHOLECYSTECTOMY;  Surgeon: Fritzi Mandes, MD;  Location: [facility] OR;  Service: General;  Laterality: N/A;   COLONOSCOPY WITH PROPOFOL N/A 02/27/2017   Procedure: COLONOSCOPY WITH PROPOFOL;  Surgeon: Kerin Salen, MD;  Location[facility] ENDOSCOPY;  Service: Gastroenterology;  Laterality: N/A;   DILATION AND CURETTAGE OF UTERUS     ESOPHAGOGASTRODUODENOSCOPY (EGD) WITH PROPOFOL N/A 02/27/2017   Procedure: ESOPHAGOGASTRODUODENOSCOPY (EGD) WITH PROPOFOL;  Surgeon: Kerin Salen, MD;  Location: [facility] ENDOSCOPY;  Service: Gastroenterology;  Laterality: N/A;   KNEE ARTHROSCOPY Bilateral    TUBAL LIGATION Bilateral 08/18/2013   Procedure: POST PARTUM TUBAL LIGATION;  Surgeon: Lazaro Arms, MD;  Location: [facility] ORS;  Service: Gynecology;  Laterality: Bilateral;   WISDOM TOOTH EXTRACTION     Family History  Problem Relation Age of Onset   Hypertension Mother    Hyperlipidemia Mother    Pancreatic cancer Maternal Aunt    Breast cancer Maternal Grandmother    Leukemia Paternal Grandfather    Outpatient Medications Prior to Visit  Medication Sig Dispense Refill   amLODipine (NORVASC) 10 MG tablet Take 10 mg by mouth daily.     busPIRone (BUSPAR) 7.5 MG tablet Take 7.5 mg by mouth 2 (two) times daily.     hydrALAZINE (APRESOLINE) 10 MG tablet Take 1 tablet (10 mg total) by  mouth 2 (two) times daily as needed (if BP is over 150/90). 45 tablet 0   hydrochlorothiazide (HYDRODIURIL) 25 MG tablet Take 25 mg by mouth daily.     No facility-administered medications prior to visit.   Allergies  Allergen Reactions   Ativan [Lorazepam] Itching    Feet, Hands, Face     ROS: A complete ROS was performed with pertinent  positives/negatives noted in the HPI. The remainder of the ROS are negative.    Objective:   Today's Vitals   12/16/22 0818 12/16/22 0830  BP: 125/80 128/80  Pulse: 70   SpO2: 99%   Weight: 212 lb 11.2 oz (96.5 kg)   Height: 5\' 2"  (1.575 m)     Physical Exam          GENERAL: Well-appearing, in NAD. Well nourished.  SKIN: Pink, warm and dry. No rash, lesion, ulceration, or ecchymoses.  Head: Normocephalic. NECK: Trachea midline. Full ROM w/o pain or tenderness. No lymphadenopathy.  EARS: Tympanic membranes are intact, translucent without bulging and without drainage. Appropriate landmarks visualized.  EYES: Conjunctiva clear without exudates. EOMI, PERRL, no drainage present.  NOSE: Septum midline w/o deformity. Nares patent, mucosa pink and non-inflamed w/o drainage. No sinus tenderness.  THROAT: Uvula midline. Oropharynx clear. Tonsils non-inflamed without exudate. Mucous membranes pink and moist.  RESPIRATORY: Chest wall symmetrical. Respirations even and non-labored. Breath sounds clear to auscultation bilaterally.  CARDIAC: S1, S2 present, regular rate and rhythm without murmur or gallops. Peripheral pulses 2+ bilaterally.  MSK: Muscle tone and strength appropriate for age. Joints w/o tenderness, redness, or swelling.  EXTREMITIES: Without clubbing, cyanosis, or edema.  NEUROLOGIC: No motor or sensory deficits. Steady, even gait. C2-C12 intact.  PSYCH/MENTAL STATUS: Alert, oriented x 3. Cooperative, appropriate mood and affect.   Results for orders placed or performed in visit on 11/30/22  Lipid panel  Result Value Ref Range   Cholesterol, Total 213 (H) 100 - 199 mg/dL   Triglycerides 94 0 - 149 mg/dL   HDL 43 >57 mg/dL   VLDL Cholesterol Cal 17 5 - 40 mg/dL   LDL Chol Calc (NIH) 322 (H) 0 - 99 mg/dL   Chol/HDL Ratio 5.0 (H) 0.0 - 4.4 ratio  Comprehensive metabolic panel  Result Value Ref Range   Glucose 93 70 - 99 mg/dL   BUN 9 6 - 24 mg/dL   Creatinine, Ser 0.25  0.57 - 1.00 mg/dL   eGFR 81 >42 HC/WCB/7.62   BUN/Creatinine Ratio 10 9 - 23   Sodium 139 134 - 144 mmol/L   Potassium 3.7 3.5 - 5.2 mmol/L   Chloride 96 96 - 106 mmol/L   CO2 26 20 - 29 mmol/L   Calcium 9.6 8.7 - 10.2 mg/dL   Total Protein 7.0 6.0 - 8.5 g/dL   Albumin 4.5 3.9 - 4.9 g/dL   Globulin, Total 2.5 1.5 - 4.5 g/dL   Bilirubin Total 0.5 0.0 - 1.2 mg/dL   Alkaline Phosphatase 95 44 - 121 IU/L   AST 13 0 - 40 IU/L   ALT 17 0 - 32 IU/L  CBC with Differential/Platelet  Result Value Ref Range   WBC 5.5 3.4 - 10.8 x10E3/uL   RBC 4.99 3.77 - 5.28 x10E6/uL   Hemoglobin 14.0 11.1 - 15.9 g/dL   Hematocrit 83.1 51.7 - 46.6 %   MCV 84 79 - 97 fL   MCH 28.1 26.6 - 33.0 pg   MCHC 33.4 31.5 - 35.7 g/dL   RDW 61.6 07.3 -  15.4 %   Platelets 355 150 - 450 x10E3/uL   Neutrophils 67 Not Estab. %   Lymphs 21 Not Estab. %   Monocytes 7 Not Estab. %   Eos 4 Not Estab. %   Basos 1 Not Estab. %   Neutrophils Absolute 3.7 1.4 - 7.0 x10E3/uL   Lymphocytes Absolute 1.2 0.7 - 3.1 x10E3/uL   Monocytes Absolute 0.4 0.1 - 0.9 x10E3/uL   EOS (ABSOLUTE) 0.2 0.0 - 0.4 x10E3/uL   Basophils Absolute 0.0 0.0 - 0.2 x10E3/uL   Immature Granulocytes 0 Not Estab. %   Immature Grans (Abs) 0.0 0.0 - 0.1 x10E3/uL  Hemoglobin A1c  Result Value Ref Range   Hgb A1c MFr Bld 5.5 4.8 - 5.6 %   Est. average glucose Bld gHb Est-mCnc 111 mg/dL  TSH  Result Value Ref Range   TSH 1.400 0.450 - 4.500 uIU/mL  Alpha-Gal Panel  Result Value Ref Range   Class Description Allergens Comment    IgE (Immunoglobulin E), Serum 6 6 - 495 IU/mL   Pork IgE <0.10 Class 0 kU/L   Beef IgE <0.10 Class 0 kU/L   Allergen Lamb IgE <0.10 Class 0 kU/L   O215-IgE Alpha-Gal <0.10 Class 0 kU/L  Ehrlichia antibody panel  Result Value Ref Range   E.Chaffeensis (HME) IgG Negative Neg:<1:64   E. Chaffeensis (HME) IgM Titer Negative Neg:<1:20   HGE IgG Titer Negative Neg:<1:64   HGE IgM Titer Negative Neg:<1:20   Result Comment:  Comment   Lyme Disease Serology w/Reflex  Result Value Ref Range   Lyme Total Antibody EIA Negative Negative  ANA  Result Value Ref Range   Anti Nuclear Antibody (ANA) Negative Negative  Sedimentation rate  Result Value Ref Range   Sed Rate 8 0 - 40 mm/hr  Vitamin B12  Result Value Ref Range   Vitamin B-12 543 232 - 1,245 pg/mL  Vitamin B1  Result Value Ref Range   Thiamine 75.0 66.5 - 200.0 nmol/L  Iron, TIBC and Ferritin Panel  Result Value Ref Range   Total Iron Binding Capacity 355 250 - 450 ug/dL   UIBC 401 027 - 253 ug/dL   Iron 47 27 - 664 ug/dL   Iron Saturation 13 (L) 15 - 55 %   Ferritin 71 15 - 150 ng/mL  Uric acid  Result Value Ref Range   Uric Acid 6.8 (H) 2.6 - 6.2 mg/dL  Spotted Fever Group Antibodies  Result Value Ref Range   Spotted Fever Group IgG <1:64 Neg:<1:64   Spotted Fever Group IgM <1:64 Neg:<1:64   Result Comment Comment     The 10-year ASCVD risk score (Arnett DK, et al., 2019) is: 5.4%     Assessment & Plan:  1. Primary hypertension Stable at visit. Recommend taking amlodipine at night to reduce possibility of leg swelling and continue hydrochlorothiazide as recommended. Log BP in AM, 12pm, and evening for the weekend and pt will notify PCP by my chart to recommend titration if needed. She has hydralazine to use if BP over 160/90.   2. Bilateral leg pain Possibly Gout related, recommended trial of Colchicine, patient declined at this time. She will try Naproxen for relief and notify PCP if rash or pain worsens or returns.   3. Breast cancer screening by mammogram Mammo ordered to [facility] at [facility]. Would like to get Pap in 1-2 months with PCP.  - MM 3D SCREENING MAMMOGRAM BILATERAL BREAST; Future  4. Screening for colon cancer Referral placed to  [facility] for routine cscope.  - Ambulatory referral to Gastroenterology  5. HLD Discussed dietary changes and regular exercise with patient. Recommend diet high in Omega 3 and lean  proteins. Repeat at [facility] in January.    Return in about 2 months (around 02/15/2023) for Pap and Discuss Vaccines.    Patient to reach out to office if new, worrisome, or unresolved symptoms arise or if no improvement in patient's condition. Patient verbalized understanding and is agreeable to treatment plan. All questions answered to patient's satisfaction.   Of note, portions of this note may have been created with voice recognition software Physicist, medical). While this note has been edited for accuracy, occasional wrong-word or 'sound-a-like' substitutions may have occurred due to the inherent limitations of voice recognition software.  Yolanda Manges, FNP

## 2023-02-15 ENCOUNTER — Ambulatory Visit (HOSPITAL_BASED_OUTPATIENT_CLINIC_OR_DEPARTMENT_OTHER): Payer: Managed Care, Other (non HMO) | Admitting: Family Medicine

## 2023-02-15 ENCOUNTER — Other Ambulatory Visit (HOSPITAL_COMMUNITY)
Admission: RE | Admit: 2023-02-15 | Discharge: 2023-02-15 | Disposition: A | Discharge status: Home / Self Care | Payer: Managed Care, Other (non HMO) | Source: Ambulatory Visit | Attending: Family Medicine | Admitting: Family Medicine

## 2023-02-15 ENCOUNTER — Encounter (HOSPITAL_BASED_OUTPATIENT_CLINIC_OR_DEPARTMENT_OTHER): Payer: Self-pay | Admitting: Family Medicine

## 2023-02-15 VITALS — BP 138/85 | HR 79 | Ht 62.0 in | Wt 212.4 lb

## 2023-02-15 DIAGNOSIS — D3501 Benign neoplasm of right adrenal gland: Secondary | ICD-10-CM

## 2023-02-15 DIAGNOSIS — M79604 Pain in right leg: Secondary | ICD-10-CM

## 2023-02-15 DIAGNOSIS — E669 Obesity, unspecified: Secondary | ICD-10-CM | POA: Insufficient documentation

## 2023-02-15 DIAGNOSIS — E66812 Obesity, class 2: Secondary | ICD-10-CM | POA: Insufficient documentation

## 2023-02-15 DIAGNOSIS — I10 Essential (primary) hypertension: Secondary | ICD-10-CM

## 2023-02-15 DIAGNOSIS — Z1211 Encounter for screening for malignant neoplasm of colon: Secondary | ICD-10-CM

## 2023-02-15 DIAGNOSIS — Z124 Encounter for screening for malignant neoplasm of cervix: Secondary | ICD-10-CM | POA: Diagnosis present

## 2023-02-15 DIAGNOSIS — M79605 Pain in left leg: Secondary | ICD-10-CM

## 2023-02-15 DIAGNOSIS — Z6839 Body mass index (BMI) 39.0-39.9, adult: Secondary | ICD-10-CM

## 2023-02-15 HISTORY — DX: Benign neoplasm of right adrenal gland: D35.01

## 2023-02-15 MED ORDER — ZEPBOUND 2.5 MG/0.5ML ~~LOC~~ SOAJ: 2.5000 mg | SUBCUTANEOUS | 0 refills | Status: DC

## 2023-02-15 MED ORDER — LOSARTAN POTASSIUM 25 MG PO TABS: 25.0000 mg | ORAL_TABLET | Freq: Every day | ORAL | 3 refills | Status: DC

## 2023-02-15 NOTE — Progress Notes (Signed)
Subjective:   Brittany Cherry 1971/03/08 02/15/2023  Chief Complaint  Patient presents with   Medical Management of Chronic Issues    2 month follow up    HPI: Brittany Cherry presents today for re-assessment and management of chronic medical conditions.  Health Maintenance:  Patient is due for mammogram, colonoscopy and pap smear. She has opted to perform pap and breast exam today in office with PCP.   ADRENAL ADENOMA:  Patient had incidental finding of right adrenal adenoma on 12/25/20. She has not had follow up screening of this since that time as recommended by radiology. She is asymptomatic at this time. Would like to proceed with follow up CT.   HYPERTENSION: Brittany Cherry presents for the medical management of hypertension.  Patient's current hypertension medication regimen is: Amlodipine 10mg , hydrochlorothiazide 25mg   Patient is  currently taking prescribed medications for HTN.  Patient is  regularly keeping a check on BP at home.  Adhering to low sodium diet: Yes Exercising Regularly: Not currently Denies headache, dizziness, CP, SHOB, vision changes.    BP Readings from Last 3 Encounters:  02/15/23 138/85  12/16/22 128/80  11/30/22 (!) 142/98    Vein:  Concern about discoloration and having intermittent sensation changes to toes. She did see [facility] for evaluation of venous disease/stasis and had bilateral US performed. Pt states her [place] was normal and no sclerotherapy was required. She does wear compression socks approx. 2-3 x a week. She states she is concerned for "redness" and discoloration to lower extremities. Denies warmth. She has "chronic aching" to bilateral lower extremities.    WEIGHT MANAGEMENT: Brittany Cherry presents for weight management.  Adhering to healthy diet: Yes Current dietary plan: Monitoring carbohydrates, has cut down on sugar intake and cut out soda Regular exercise regimen: None  Medications tried in the past:  None  Wt Readings from Last 3 Encounters:  02/15/23 212 lb 6.4 oz (96.3 kg)  12/16/22 212 lb 11.2 oz (96.5 kg)  11/30/22 212 lb (96.2 kg)     The following portions of the patient's history were reviewed and updated as appropriate: past medical history, past surgical history, family history, social history, allergies, medications, and problem list.   Patient Active Problem List   Diagnosis Date Noted   Adenoma of right adrenal gland 02/15/2023   Class 2 severe obesity due to excess calories with serious comorbidity and body mass index (BMI) of 39.0 to 39.9 in adult (HCC) 02/15/2023   Rash and other nonspecific skin eruption 11/30/2022   Bilateral leg pain 11/30/2022   Hypertension 09/03/2021   Peri-menopausal 09/03/2021   Primary osteoarthritis of both knees 06/22/2018   Acute anemia 02/26/2017   Chronic pain of both knees 06/07/2016   S/P tubal ligation 08/20/2013   Past Medical History:  Diagnosis Date   AMA (advanced maternal age) multigravida 35+    Anemia    Depression    GERD (gastroesophageal reflux disease)    Headache    Hyperemesis gravidarum    Hypertension    Past Surgical History:  Procedure Laterality Date   CHOLECYSTECTOMY N/A 04/08/2020   Procedure: LAPAROSCOPIC CHOLECYSTECTOMY;  Surgeon: Fritzi Mandes, MD;  Location: [facility] OR;  Service: General;  Laterality: N/A;   COLONOSCOPY WITH PROPOFOL N/A 02/27/2017   Procedure: COLONOSCOPY WITH PROPOFOL;  Surgeon: Kerin Salen, MD;  Location: [facility] ENDOSCOPY;  Service: Gastroenterology;  Laterality: N/A;   DILATION AND CURETTAGE OF UTERUS     ESOPHAGOGASTRODUODENOSCOPY (EGD) WITH PROPOFOL N/A 02/27/2017  Procedure: ESOPHAGOGASTRODUODENOSCOPY (EGD) WITH PROPOFOL;  Surgeon: Kerin Salen, MD;  Location: [facility] ENDOSCOPY;  Service: Gastroenterology;  Laterality: N/A;   KNEE ARTHROSCOPY Bilateral    TUBAL LIGATION Bilateral 08/18/2013   Procedure: POST PARTUM TUBAL LIGATION;  Surgeon: Lazaro Arms, MD;  Location: [facility] ORS;   Service: Gynecology;  Laterality: Bilateral;   WISDOM TOOTH EXTRACTION     Family History  Problem Relation Age of Onset   Hypertension Mother    Hyperlipidemia Mother    Pancreatic cancer Maternal Aunt    Breast cancer Maternal Grandmother    Leukemia Paternal Grandfather    Outpatient Medications Prior to Visit  Medication Sig Dispense Refill   busPIRone (BUSPAR) 7.5 MG tablet Take 7.5 mg by mouth 2 (two) times daily.     hydrALAZINE (APRESOLINE) 10 MG tablet Take 1 tablet (10 mg total) by mouth 2 (two) times daily as needed (if BP is over 150/90). 45 tablet 0   hydrochlorothiazide (HYDRODIURIL) 25 MG tablet Take 25 mg by mouth daily.     amLODipine (NORVASC) 10 MG tablet Take 10 mg by mouth daily.     No facility-administered medications prior to visit.   Allergies  Allergen Reactions   Ativan [Lorazepam] Itching    Feet, Hands, Face     ROS: A complete ROS was performed with pertinent positives/negatives noted in the HPI. The remainder of the ROS are negative.    Objective:   Today's Vitals   02/15/23 0831 02/15/23 0939  BP: (!) 140/86 138/85  Pulse: 79   SpO2: 97%   Weight: 212 lb 6.4 oz (96.3 kg)   Height: 5\' 2"  (1.575 m)     Physical Exam          GENERAL: Well-appearing, in NAD. Well nourished.  SKIN: Pink, warm and dry. No rash, lesion, ulceration, or ecchymoses.  Head: Normocephalic. NECK: Trachea midline. Full ROM w/o pain or tenderness.  THROAT: Uvula midline. Oropharynx clear.. Mucous membranes pink and moist.  RESPIRATORY: Chest wall symmetrical. Respirations even and non-labored.  MSK: Muscle tone and strength appropriate for age. Joints w/o tenderness, redness, or swelling.  EXTREMITIES: Without clubbing, cyanosis. Mild non pitting +1 edema to bilateral lower extremities. Slightly Venous stasis discoloration present to bilateral lower extremities. No erythema.  NEUROLOGIC: No motor or sensory deficits. Steady, even gait. C2-C12 intact.   PSYCH/MENTAL STATUS: Alert, oriented x 3. Cooperative, appropriate mood and affect.    The 10-year ASCVD risk score (Arnett DK, et al., 2019) is: 7.2%     Assessment & Plan:  1. Adenoma of right adrenal gland CT Abdomen Adrenal ordered per DRI recommendations and radiology recommendations for incidental adenoma finding. Pt will be called to schedule.  - CT ABDOMEN W WO CONTRAST; Future  2. Primary hypertension 3. Bilateral leg pain Stable, pt desiring to try Losartan and stop amlodipine due to leg swelling. PCP is agreeable. Renal function WNL. Discussed possible side effects and safe use of medication. Pt will monitor BP regularly as well. Follow up in 3 months or sooner if needed. Recommend daily use of compression socks and elevation, monitoring salt intake.   4. Class 2 severe obesity due to excess calories with serious comorbidity and body mass index (BMI) of 39.0 to 39.9 in adult [facility]) Will try Zepbound if covered. Discussed possibility of referral to [facility] versus compound if not covered. Pt agreeable and will send in Zepbound 2.5mg  weekly. Discussed possible side effects and pt denies fam or personal hx of MEN2  or medullary thyroid cancer.  - tirzepatide (ZEPBOUND) 2.5 MG/0.5ML Pen; Inject 2.5 mg into the skin once a week.  Dispense: 2 mL; Refill: 0  5. Screening for colon cancer Referral had closed from prior. Referral placed to [facility] for cscope routine.  - Ambulatory referral to Gastroenterology  6. Encounter for Papanicolaou smear for cervical cancer screening Pap completed. Will be notified of results when available.  - Cytology - PAP   Meds ordered this encounter  Medications   tirzepatide (ZEPBOUND) 2.5 MG/0.5ML Pen    Sig: Inject 2.5 mg into the skin once a week.    Dispense:  2 mL    Refill:  0    Order Specific Question:   Supervising Provider    Answer:   DE [place], RAYMOND J [[id number]]   losartan (COZAAR) 25 MG tablet    Sig: Take 1  tablet (25 mg total) by mouth daily.    Dispense:  90 tablet    Refill:  3    Order Specific Question:   Supervising Provider    Answer:   DE [place], RAYMOND J [[id number]]   Lab Orders  No laboratory test(s) ordered today    Return in about 3 months (around 05/18/2023) for Weight follow up .    Patient to reach out to office if new, worrisome, or unresolved symptoms arise or if no improvement in patient's condition. Patient verbalized understanding and is agreeable to treatment plan. All questions answered to patient's satisfaction.    Yolanda Manges, FNP

## 2023-02-15 NOTE — Patient Instructions (Addendum)
   Stop your amlodipine. Start Losartan 25mg  daily.    You will be called to schedule your CT scan for adrenal adenoma and for your colonoscopy.   Please schedule your mammogram.    Increase your fiber, water intake and possible stool softeners (Colace) prior to starting weight loss medications.

## 2023-02-17 ENCOUNTER — Telehealth (HOSPITAL_BASED_OUTPATIENT_CLINIC_OR_DEPARTMENT_OTHER): Payer: Self-pay | Admitting: Family Medicine

## 2023-02-17 NOTE — Telephone Encounter (Signed)
Fax was received from cover my meds for PA for zepbound. When first went on to attempt PA, message was received stating that the pharmacy claim was rejecting coverage of the zepbound and I had the option to either attempt PA or archive and finish.  PA attempted. Message received which is shown below:  [name] (Key: [id number])  [employer] is unable to retrieve the clinical questions that must be submitted to initiate the PA request. Please see more information at the bottom of the page for next steps.  Outcome Additional Information Required Message from [employer]: Drug is not covered by plan    Routing to [place] for review.

## 2023-02-20 LAB — CYTOLOGY - PAP
Comment: NEGATIVE
Diagnosis: NEGATIVE
High risk HPV: NEGATIVE

## 2023-02-20 NOTE — Progress Notes (Signed)
Pap Smear is normal. Health maintenance updated. Repeat pap in 3 years.

## 2023-02-20 NOTE — Telephone Encounter (Signed)
Jon Gills, please see mychart message sent by pt from what was found out about the prior authorization.

## 2023-02-21 ENCOUNTER — Encounter (HOSPITAL_BASED_OUTPATIENT_CLINIC_OR_DEPARTMENT_OTHER): Payer: Self-pay | Admitting: Family Medicine

## 2023-03-15 ENCOUNTER — Ambulatory Visit
Admission: RE | Admit: 2023-03-15 | Discharge: 2023-03-15 | Disposition: A | Discharge status: Home / Self Care | Payer: Managed Care, Other (non HMO) | Source: Ambulatory Visit | Attending: Family Medicine | Admitting: Family Medicine

## 2023-03-15 DIAGNOSIS — D3501 Benign neoplasm of right adrenal gland: Secondary | ICD-10-CM

## 2023-03-15 MED ORDER — IOPAMIDOL (ISOVUE-300) INJECTION 61%
100.0000 mL | Freq: Once | INTRAVENOUS | Status: AC | PRN
Start: 2023-03-15 — End: 2023-03-15
  Administered 2023-03-15: 100 mL via INTRAVENOUS

## 2023-03-27 NOTE — Progress Notes (Signed)
Hi Brittany Cherry, Your CT looking at the adrenal mass shows that this is a benign mass.  This means it is not cancerous and it has been stable from your previous images.  We do not need to follow-up with any additional imaging at this time.  There was a small kidney stone present to your left kidney that will pass on its own and likely will not cause any symptoms.  And there was also a small umbilical hernia.  We do not perform any measures to resolve this unless it is giving you difficulty or symptoms.  If you have any further questions please let me know

## 2023-03-29 ENCOUNTER — Telehealth (HOSPITAL_BASED_OUTPATIENT_CLINIC_OR_DEPARTMENT_OTHER): Payer: Self-pay | Admitting: *Deleted

## 2023-03-29 ENCOUNTER — Encounter (HOSPITAL_BASED_OUTPATIENT_CLINIC_OR_DEPARTMENT_OTHER): Payer: Self-pay | Admitting: *Deleted

## 2023-03-29 ENCOUNTER — Other Ambulatory Visit (HOSPITAL_BASED_OUTPATIENT_CLINIC_OR_DEPARTMENT_OTHER): Payer: Self-pay | Admitting: Family Medicine

## 2023-03-29 ENCOUNTER — Ambulatory Visit (HOSPITAL_BASED_OUTPATIENT_CLINIC_OR_DEPARTMENT_OTHER): Payer: Managed Care, Other (non HMO) | Admitting: *Deleted

## 2023-03-29 MED ORDER — BUSPIRONE HCL 7.5 MG PO TABS: 7.5000 mg | ORAL_TABLET | Freq: Two times a day (BID) | ORAL | 5 refills | Status: DC

## 2023-03-29 NOTE — Telephone Encounter (Signed)
Pt currently take buspar 7.5 mg pt would like to know if you would like to continue filling this as she is going to be out of the medication

## 2023-04-06 ENCOUNTER — Encounter (HOSPITAL_BASED_OUTPATIENT_CLINIC_OR_DEPARTMENT_OTHER): Payer: Self-pay | Admitting: Family Medicine

## 2023-04-06 ENCOUNTER — Other Ambulatory Visit (HOSPITAL_BASED_OUTPATIENT_CLINIC_OR_DEPARTMENT_OTHER): Payer: Self-pay | Admitting: Family Medicine

## 2023-04-06 DIAGNOSIS — I10 Essential (primary) hypertension: Secondary | ICD-10-CM

## 2023-04-06 MED ORDER — HYDROCHLOROTHIAZIDE 25 MG PO TABS: 25.0000 mg | ORAL_TABLET | Freq: Every day | ORAL | 3 refills | Status: DC

## 2023-04-06 NOTE — Telephone Encounter (Signed)
 Jon Gills, please see mychart sent by pt and advise.

## 2023-05-18 ENCOUNTER — Ambulatory Visit (HOSPITAL_BASED_OUTPATIENT_CLINIC_OR_DEPARTMENT_OTHER): Payer: Managed Care, Other (non HMO) | Admitting: Family Medicine

## 2023-06-08 ENCOUNTER — Encounter (HOSPITAL_BASED_OUTPATIENT_CLINIC_OR_DEPARTMENT_OTHER): Payer: Self-pay | Admitting: *Deleted

## 2023-08-17 ENCOUNTER — Encounter (HOSPITAL_COMMUNITY): Payer: Self-pay

## 2023-08-24 ENCOUNTER — Encounter (HOSPITAL_BASED_OUTPATIENT_CLINIC_OR_DEPARTMENT_OTHER): Payer: Self-pay | Admitting: Family Medicine

## 2023-08-24 ENCOUNTER — Ambulatory Visit (HOSPITAL_BASED_OUTPATIENT_CLINIC_OR_DEPARTMENT_OTHER): Admitting: Family Medicine

## 2023-08-24 VITALS — BP 119/76 | HR 81 | Ht 62.0 in | Wt 197.9 lb

## 2023-08-24 DIAGNOSIS — E669 Obesity, unspecified: Secondary | ICD-10-CM

## 2023-08-24 DIAGNOSIS — Z1211 Encounter for screening for malignant neoplasm of colon: Secondary | ICD-10-CM | POA: Diagnosis not present

## 2023-08-24 DIAGNOSIS — F419 Anxiety disorder, unspecified: Secondary | ICD-10-CM | POA: Insufficient documentation

## 2023-08-24 DIAGNOSIS — E611 Iron deficiency: Secondary | ICD-10-CM | POA: Insufficient documentation

## 2023-08-24 DIAGNOSIS — I10 Essential (primary) hypertension: Secondary | ICD-10-CM

## 2023-08-24 DIAGNOSIS — E782 Mixed hyperlipidemia: Secondary | ICD-10-CM | POA: Insufficient documentation

## 2023-08-24 NOTE — Progress Notes (Signed)
 Subjective:   Brittany Cherry 04-May-1970 08/24/2023  Chief Complaint  Patient presents with   Medical Management of Chronic Issues    33-month follow up; denies any main concerns for today's visit.    HPI: Brittany Cherry presents today for re-assessment and management of chronic medical conditions.   HYPERTENSION: Brittany Cherry presents for the medical management of hypertension.  Patient's current hypertension medication regimen is: Losartan  25mg  and hydrochlorothiazide  25mg   Patient is  currently taking prescribed medications for HTN.  Patient is  regularly keeping a check on BP at home.  Adhering to low sodium diet: Yes Exercising Regularly: Yes  Denies headache, dizziness, CP, SHOB, vision changes.   BP Readings from Last 3 Encounters:  08/24/23 119/76  03/29/23 128/80  02/15/23 138/85   Wt Readings from Last 3 Encounters:  08/24/23 197 lb 14.4 oz (89.8 kg)  02/15/23 212 lb 6.4 oz (96.3 kg)  12/16/22 212 lb 11.2 oz (96.5 kg)     ANXIETY: Brittany Cherry presents for the medical management of anxiety.  Current medication regimen: Buspar  7.5 mg BID  Well controlled: Yes, denies concerns of anxiety, MDD at this time.  Denies SI/HI.     08/24/2023    1:56 PM 02/15/2023    8:34 AM 12/16/2022    8:21 AM 11/30/2022    8:40 AM  GAD 7 : Generalized Anxiety Score  Nervous, Anxious, on Edge 1 1 1 2   Control/stop worrying 1 1 0 1  Worry too much - different things 1 1 1 1   Trouble relaxing 1 1 1  0  Restless 0 0 0 0  Easily annoyed or irritable 1 3 1 1   Afraid - awful might happen 0 0 0 0  Total GAD 7 Score 5 7 4 5   Anxiety Difficulty Not difficult at all Not difficult at all Not difficult at all Not difficult at all      08/24/2023    1:55 PM 02/15/2023    8:34 AM 12/16/2022    8:21 AM  PHQ9 SCORE ONLY  PHQ-9 Total Score 0 3 4    IDA:  Brittany Cherry presents for the medical management of Anemia. Patient has struggled with iron deficiency for  several years.  Current medication : None; diet controlled with iron rich foods  Well controlled: Yes Denies bloody stools, hematuria, excessive fatigue, palpitations, pica.  Patient is not seeing hematology for management.    Lab Results  Component Value Date   WBC 5.5 11/30/2022   HGB 14.0 11/30/2022   HCT 41.9 11/30/2022   MCV 84 11/30/2022   PLT 355 11/30/2022    Lab Results  Component Value Date   IRON 47 11/30/2022   TIBC 355 11/30/2022   FERRITIN 71 11/30/2022   HYPERLIPIDEMIA: Brittany Cherry presents for the medical management of hyperlipidemia. She has had considerable weight loss in the past 6 months.  Patient's current HLD regimen is: Diet, Exercise.   Lab Results  Component Value Date   CHOL 213 (H) 11/30/2022   HDL 43 11/30/2022   LDLCALC 153 (H) 11/30/2022   TRIG 94 11/30/2022   CHOLHDL 5.0 (H) 11/30/2022   The 10-year ASCVD risk score (Arnett DK, et al., 2019) is: 4.1%  The following portions of the patient's history were reviewed and updated as appropriate: past medical history, past surgical history, family history, social history, allergies, medications, and problem list.   Patient Active Problem List   Diagnosis Date Noted   Anxiety 08/24/2023   Iron  deficiency 08/24/2023   Mixed hyperlipidemia 08/24/2023   Obesity (BMI 30-39.9) 02/15/2023   Rash and other nonspecific skin eruption 11/30/2022   Bilateral leg pain 11/30/2022   Hypertension 09/03/2021   Peri-menopausal 09/03/2021   Primary osteoarthritis of both knees 06/22/2018   Acute anemia 02/26/2017   Chronic pain of both knees 06/07/2016   S/P tubal ligation 08/20/2013   Past Medical History:  Diagnosis Date   Adenoma of right adrenal gland 02/15/2023   AMA (advanced maternal age) multigravida 35+    Anemia    Depression    GERD (gastroesophageal reflux disease)    Headache    Hyperemesis gravidarum    Hypertension    Past Surgical History:  Procedure Laterality Date    CHOLECYSTECTOMY N/A 04/08/2020   Procedure: LAPAROSCOPIC CHOLECYSTECTOMY;  Surgeon: Lujean Sake, MD;  Location: [facility] OR;  Service: General;  Laterality: N/A;   COLONOSCOPY WITH PROPOFOL  N/A 02/27/2017   Procedure: COLONOSCOPY WITH PROPOFOL ;  Surgeon: Genell Ken, MD;  Location: [facility] ENDOSCOPY;  Service: Gastroenterology;  Laterality: N/A;   DILATION AND CURETTAGE OF UTERUS     ESOPHAGOGASTRODUODENOSCOPY (EGD) WITH PROPOFOL  N/A 02/27/2017   Procedure: ESOPHAGOGASTRODUODENOSCOPY (EGD) WITH PROPOFOL ;  Surgeon: Genell Ken, MD;  Location: [facility] ENDOSCOPY;  Service: Gastroenterology;  Laterality: N/A;   KNEE ARTHROSCOPY Bilateral    TUBAL LIGATION Bilateral 08/18/2013   Procedure: POST PARTUM TUBAL LIGATION;  Surgeon: Wendelyn Halter, MD;  Location: [facility] ORS;  Service: Gynecology;  Laterality: Bilateral;   WISDOM TOOTH EXTRACTION     Family History  Problem Relation Age of Onset   Hypertension Mother    Hyperlipidemia Mother    Pancreatic cancer Maternal Aunt    Breast cancer Maternal Grandmother    Leukemia Paternal Grandfather    Outpatient Medications Prior to Visit  Medication Sig Dispense Refill   Semaglutide-Weight Management 0.5 MG/0.5ML SOAJ Inject 0.5 mg into the skin.     busPIRone  (BUSPAR ) 7.5 MG tablet Take 1 tablet (7.5 mg total) by mouth 2 (two) times daily. 60 tablet 5   hydrALAZINE  (APRESOLINE ) 10 MG tablet Take 1 tablet (10 mg total) by mouth 2 (two) times daily as needed (if BP is over 150/90). 45 tablet 0   hydrochlorothiazide  (HYDRODIURIL ) 25 MG tablet Take 1 tablet (25 mg total) by mouth daily. 90 tablet 3   losartan  (COZAAR ) 25 MG tablet Take 1 tablet (25 mg total) by mouth daily. 90 tablet 3   tirzepatide  (ZEPBOUND ) 2.5 MG/0.5ML Pen Inject 2.5 mg into the skin once a week. 2 mL 0   No facility-administered medications prior to visit.   Allergies  Allergen Reactions   Ativan  [Lorazepam ] Itching    Feet, Hands, Face     ROS: A complete ROS was performed with pertinent  positives/negatives noted in the HPI. The remainder of the ROS are negative.    Objective:   Today's Vitals   08/24/23 1352  BP: 119/76  Pulse: 81  SpO2: 99%  Weight: 197 lb 14.4 oz (89.8 kg)  Height: 5\' 2"  (1.575 m)    Physical Exam          GENERAL: Well-appearing, in NAD. Well nourished.  SKIN: Pink, warm and dry. No rash, lesion, ulceration, or ecchymoses.  Head: Normocephalic. NECK: Trachea midline. Full ROM w/o pain or tenderness.  RESPIRATORY: Chest wall symmetrical. Respirations even and non-labored. Breath sounds clear to auscultation bilaterally.  CARDIAC: S1, S2 present, regular rate and rhythm without murmur or gallops. Peripheral pulses 2+ bilaterally.  MSK: Muscle  tone and strength appropriate for age.  NEUROLOGIC: No motor or sensory deficits. Steady, even gait. C2-C12 intact.  PSYCH/MENTAL STATUS: Alert, oriented x 3. Cooperative, appropriate mood and affect.   Health Maintenance Due  Topic Date Due   COVID-19 Vaccine (1) Never done   Zoster Vaccines- Shingrix (1 of 2) Never done   Colonoscopy  02/28/2020   MAMMOGRAM  12/06/2021      Assessment & Plan:  1. Screening for colon cancer (Primary) - Ambulatory referral to Gastroenterology  2. Mixed hyperlipidemia Pt congratulated on weight loss. Will obtain LP today and evaluate need to statin therapy pending result. Doing well with diet and exercise.  - Lipid panel  3. Primary hypertension Controlled. Continue current regimen. Recommend monitoring BP regularly. With weight loss, patient will monitor for symptoms of hypotension.   4. Iron deficiency Controlled with diet. Will check w/ iron panel and CBC today and provide supplement if needed.  - Iron, TIBC and Ferritin Panel - CBC with Differential/Platelet  5. Obesity (BMI 30-39.9) Patient reduced BMI from 39 to 36. Continue current diet and exercise. Discussed risks, benefits of compound pharmacy GLP1 and patient verbalized understanding.   6.  Anxiety Well controlled. Continue Buspar  as directed. Safety plan reviewed.    No orders of the defined types were placed in this encounter.  Lab Orders         Iron, TIBC and Ferritin Panel         CBC with Differential/Platelet         Lipid panel      Return in about 6 months (around 02/24/2024) for ANNUAL PHYSICAL, HYPERTENSION, hyperlipidemia.    Patient to reach out to office if new, worrisome, or unresolved symptoms arise or if no improvement in patient's condition. Patient verbalized understanding and is agreeable to treatment plan. All questions answered to patient's satisfaction.    Nonda Bays, [place]

## 2023-08-24 NOTE — Patient Instructions (Addendum)
 [facility]: Schedule Mammogram  Address: [address], [place], [place] [address] Areas served: [place] and nearby areas Hours:  Open ? Closes 5:30?PM Phone: [phone]  Get Shingrix (shingles) vaccine at pharmacy

## 2023-08-25 ENCOUNTER — Ambulatory Visit (HOSPITAL_BASED_OUTPATIENT_CLINIC_OR_DEPARTMENT_OTHER): Payer: Self-pay | Admitting: Family Medicine

## 2023-08-25 LAB — CBC WITH DIFFERENTIAL/PLATELET
Basophils Absolute: 0 10*3/uL (ref 0.0–0.2)
Basos: 1 %
EOS (ABSOLUTE): 0.3 10*3/uL (ref 0.0–0.4)
Eos: 5 %
Hematocrit: 41.9 % (ref 34.0–46.6)
Hemoglobin: 13.5 g/dL (ref 11.1–15.9)
Immature Grans (Abs): 0 10*3/uL (ref 0.0–0.1)
Immature Granulocytes: 1 %
Lymphocytes Absolute: 1.8 10*3/uL (ref 0.7–3.1)
Lymphs: 32 %
MCH: 28.5 pg (ref 26.6–33.0)
MCHC: 32.2 g/dL (ref 31.5–35.7)
MCV: 88 fL (ref 79–97)
Monocytes Absolute: 0.4 10*3/uL (ref 0.1–0.9)
Monocytes: 7 %
Neutrophils Absolute: 3.1 10*3/uL (ref 1.4–7.0)
Neutrophils: 54 %
Platelets: 286 10*3/uL (ref 150–450)
RBC: 4.74 x10E6/uL (ref 3.77–5.28)
RDW: 13.2 % (ref 11.7–15.4)
WBC: 5.6 10*3/uL (ref 3.4–10.8)

## 2023-08-25 LAB — IRON,TIBC AND FERRITIN PANEL
Ferritin: 125 ng/mL (ref 15–150)
Iron Saturation: 21 % (ref 15–55)
Iron: 69 ug/dL (ref 27–159)
Total Iron Binding Capacity: 324 ug/dL (ref 250–450)
UIBC: 255 ug/dL (ref 131–425)

## 2023-08-25 LAB — LIPID PANEL
Chol/HDL Ratio: 5.2 ratio — ABNORMAL HIGH (ref 0.0–4.4)
Cholesterol, Total: 202 mg/dL — ABNORMAL HIGH (ref 100–199)
HDL: 39 mg/dL — ABNORMAL LOW (ref >39)
LDL Chol Calc (NIH): 147 mg/dL — ABNORMAL HIGH (ref 0–99)
Triglycerides: 89 mg/dL (ref 0–149)
VLDL Cholesterol Cal: 16 mg/dL (ref 5–40)

## 2023-08-25 NOTE — Progress Notes (Signed)
 Hi Brittany Cherry, Your total cholesterol and LDL have lowered slightly.  Your triglycerides have improved.  You may still benefit from a statin therapy to significantly lower these values and we could start this if you would like.  Otherwise we could continue with diet and exercise and recheck at your next visit.  Your iron saturation and iron levels have improved.  No iron supplementation is needed at this time.  Your blood counts look great.  If you have any further questions or want to discuss starting statin therapy please let me know.

## 2023-09-29 ENCOUNTER — Encounter: Payer: Self-pay | Admitting: Gastroenterology

## 2023-10-03 ENCOUNTER — Ambulatory Visit
Admission: RE | Admit: 2023-10-03 | Discharge: 2023-10-03 | Disposition: A | Discharge status: Home / Self Care | Source: Ambulatory Visit | Attending: Family Medicine | Admitting: Family Medicine

## 2023-10-03 DIAGNOSIS — Z1231 Encounter for screening mammogram for malignant neoplasm of breast: Secondary | ICD-10-CM

## 2023-10-08 ENCOUNTER — Ambulatory Visit (HOSPITAL_BASED_OUTPATIENT_CLINIC_OR_DEPARTMENT_OTHER): Payer: Self-pay | Admitting: Family Medicine

## 2023-10-08 NOTE — Progress Notes (Signed)
   Your mammogram results show no evidence of breast cancer. We will plan to repeat this in 1 year for routine screening.  If you should have concerns or changes in your breasts within the next year, please let us know.   Jerre Simon, FNP-C

## 2023-11-23 ENCOUNTER — Encounter: Payer: Self-pay | Admitting: Gastroenterology

## 2023-11-23 ENCOUNTER — Ambulatory Visit: Admitting: Gastroenterology

## 2023-11-23 VITALS — BP 90/70 | HR 71 | Ht 62.21 in | Wt 196.2 lb

## 2023-11-23 DIAGNOSIS — Z860101 Personal history of adenomatous and serrated colon polyps: Secondary | ICD-10-CM

## 2023-11-23 DIAGNOSIS — Z09 Encounter for follow-up examination after completed treatment for conditions other than malignant neoplasm: Secondary | ICD-10-CM | POA: Diagnosis not present

## 2023-11-23 DIAGNOSIS — Z860100 Personal history of colon polyps, unspecified: Secondary | ICD-10-CM

## 2023-11-23 MED ORDER — NA SULFATE-K SULFATE-MG SULF 17.5-3.13-1.6 GM/177ML PO SOLN: 1.0000 | Freq: Once | ORAL | 0 refills | Status: AC

## 2023-11-23 NOTE — Patient Instructions (Signed)
 We have sent the following medications to your pharmacy for you to pick up at your convenience: SUPREP  You have been scheduled for a colonoscopy. Please follow written instructions given to you at your visit today.   If you use inhalers (even only as needed), please bring them with you on the day of your procedure.  DO NOT TAKE 7 DAYS PRIOR TO TEST- Trulicity (dulaglutide) Ozempic, Wegovy (semaglutide) Mounjaro (tirzepatide ) Bydureon Bcise (exanatide extended release)  DO NOT TAKE 1 DAY PRIOR TO YOUR TEST Rybelsus (semaglutide) Adlyxin (lixisenatide) Victoza (liraglutide) Byetta (exanatide) ___________________________________________________________________________  Due to recent changes in healthcare laws, you may see the results of your imaging and laboratory studies on MyChart before your provider has had a chance to review them.  We understand that in some cases there may be results that are confusing or concerning to you. Not all laboratory results come back in the same time frame and the provider may be waiting for multiple results in order to interpret others.  Please give us  48 hours in order for your provider to thoroughly review all the results before contacting the office for clarification of your results.   _______________________________________________________  If your blood pressure at your visit was 140/90 or greater, please contact your primary care physician to follow up on this.  _______________________________________________________  If you are age 25 or older, your body mass index should be between 23-30. Your Body mass index is 35.66 kg/m. If this is out of the aforementioned range listed, please consider follow up with your Primary Care Provider.  If you are age 41 or younger, your body mass index should be between 19-25. Your Body mass index is 35.66 kg/m. If this is out of the aformentioned range listed, please consider follow up with your Primary Care  Provider.   ________________________________________________________  The  GI providers would like to encourage you to use MYCHART to communicate with providers for non-urgent requests or questions.  Due to long hold times on the telephone, sending your provider a message by [facility] may be a faster and more efficient way to get a response.  Please allow 48 business hours for a response.  Please remember that this is for non-urgent requests.  _______________________________________________________  [facility] is using a team-based approach to care.  Your team is made up of your doctor and two to three APPS. Our APPS (Nurse Practitioners and Physician Assistants) work with your physician to ensure care continuity for you. They are fully qualified to address your health concerns and develop a treatment plan. They communicate directly with your gastroenterologist to care for you. Seeing the Advanced Practice Practitioners on your physician's team can help you by facilitating care more promptly, often allowing for earlier appointments, access to diagnostic testing, procedures, and other specialty referrals.   Thank you for trusting me with your gastrointestinal care. Deanna May, FNP-C

## 2023-11-23 NOTE — Progress Notes (Signed)
 Chief Complaint:Screening for colon cancer  Primary GI Doctor:Dr. Charlanne  HPI:  Patient is a  53  year old female patient with past medical history of GERD, hypertension, depression, and history of iron deficiency, who was referred to me by Knute Thersia Bitters, * on 08/24/23 for a evaluation of Screening for colon cancer.    Interval History     Patient presents for evaluation of colon screening colonoscopy.  Patient denies GERD or dysphagia. Patient denies nausea, vomiting, or weight loss No issues taking semaglutide.   Patient denies altered bowel habits, abdominal pain, or rectal bleeding.  No blood thinners.  Socially drinks. Nonsmoker.   No blood thinners.  Surgical history: cholecystectomy , lap knee  Patient's family history includes maternal grandfather with multiple colonic polyps.   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  Wt Readings from Last 3 Encounters:  11/23/23 196 lb 4 oz (89 kg)  08/24/23 197 lb 14.4 oz (89.8 kg)  02/15/23 212 lb 6.4 oz (96.3 kg)    Past Medical History:  Diagnosis Date   Adenoma of right adrenal gland 02/15/2023   AMA (advanced maternal age) multigravida 35+    Anemia    Depression    GERD (gastroesophageal reflux disease)    Headache    Hyperemesis gravidarum    Hypertension     Past Surgical History:  Procedure Laterality Date   CHOLECYSTECTOMY N/A 04/08/2020   Procedure: LAPAROSCOPIC CHOLECYSTECTOMY;  Surgeon: Dasie Leonor CROME, MD;  Location: [facility] OR;  Service: General;  Laterality: N/A;   COLONOSCOPY WITH PROPOFOL  N/A 02/27/2017   Procedure: COLONOSCOPY WITH PROPOFOL ;  Surgeon: Saintclair Jasper, MD;  Location: [facility] ENDOSCOPY;  Service: Gastroenterology;  Laterality: N/A;   DILATION AND CURETTAGE OF UTERUS     ESOPHAGOGASTRODUODENOSCOPY (EGD) WITH PROPOFOL  N/A 02/27/2017   Procedure: ESOPHAGOGASTRODUODENOSCOPY (EGD) WITH PROPOFOL ;  Surgeon: Saintclair Jasper, MD;  Location: [facility] ENDOSCOPY;  Service: Gastroenterology;  Laterality: N/A;   KNEE  ARTHROSCOPY Bilateral    TUBAL LIGATION Bilateral 08/18/2013   Procedure: POST PARTUM TUBAL LIGATION;  Surgeon: Vonn VEAR Inch, MD;  Location: [facility] ORS;  Service: Gynecology;  Laterality: Bilateral;   WISDOM TOOTH EXTRACTION      Current Outpatient Medications  Medication Sig Dispense Refill   busPIRone  (BUSPAR ) 7.5 MG tablet Take 1 tablet (7.5 mg total) by mouth 2 (two) times daily. 60 tablet 5   hydrALAZINE  (APRESOLINE ) 10 MG tablet Take 1 tablet (10 mg total) by mouth 2 (two) times daily as needed (if BP is over 150/90). 45 tablet 0   hydrochlorothiazide  (HYDRODIURIL ) 25 MG tablet Take 1 tablet (25 mg total) by mouth daily. 90 tablet 3   losartan  (COZAAR ) 25 MG tablet Take 1 tablet (25 mg total) by mouth daily. 90 tablet 3   Semaglutide-Weight Management 0.5 MG/0.5ML SOAJ Inject 0.5 mg into the skin.     No current facility-administered medications for this visit.    Allergies as of 11/23/2023 - Review Complete 11/23/2023  Allergen Reaction Noted   Ativan  [lorazepam ] Itching 04/09/2018    Family History  Problem Relation Age of Onset   Hypertension Mother    Hyperlipidemia Mother    Pancreatic cancer Maternal Aunt    Breast cancer Maternal Grandmother    Leukemia Paternal Grandfather     Review of Systems:    Constitutional: No weight loss, fever, chills, weakness or fatigue HEENT: Eyes: No change in vision               Ears, Nose, Throat:  No change in hearing or congestion Skin: No rash or itching Cardiovascular: No chest pain, chest pressure or palpitations   Respiratory: No SOB or cough Gastrointestinal: See HPI and otherwise negative Genitourinary: No dysuria or change in urinary frequency Neurological: No headache, dizziness or syncope Musculoskeletal: No new muscle or joint pain Hematologic: No bleeding or bruising Psychiatric: No history of depression or anxiety    Physical Exam:  Vital signs: BP 90/70 (BP Location: Left Arm, Patient Position: Sitting)    Pulse 71   Ht 5' 2.21 (1.58 m) Comment: height without shoes  Wt 196 lb 4 oz (89 kg)   BMI 35.66 kg/m   Constitutional:   Pleasant  female appears to be in NAD, Well developed, Well nourished, alert and cooperative Throat: Oral cavity and pharynx without inflammation, swelling or lesion.  Respiratory: Respirations even and unlabored. Lungs clear to auscultation bilaterally.   No wheezes, crackles, or rhonchi.  Cardiovascular: Normal S1, S2. Regular rate and rhythm. No peripheral edema, cyanosis or pallor.  Gastrointestinal:  Soft, nondistended, nontender. No rebound or guarding. Normal bowel sounds. No appreciable masses or hepatomegaly. Rectal:  Not performed.  Msk:  Symmetrical without gross deformities. Without edema, no deformity or joint abnormality.  Neurologic:  Alert and  oriented x4;  grossly normal neurologically.  Skin:   Dry and intact without significant lesions or rashes.  RELEVANT LABS AND IMAGING: CBC    Latest Ref Rng & Units 08/24/2023    2:35 PM 11/30/2022    9:26 AM 05/18/2021    2:50 PM  CBC  WBC 3.4 - 10.8 x10E3/uL 5.6  5.5  6.9   Hemoglobin 11.1 - 15.9 g/dL 86.4  85.9  86.8   Hematocrit 34.0 - 46.6 % 41.9  41.9  38.2   Platelets 150 - 450 x10E3/uL 286  355  308      CMP     Latest Ref Rng & Units 11/30/2022    9:26 AM 05/18/2021    2:50 PM 12/25/2020    8:07 AM  CMP  Glucose 70 - 99 mg/dL 93  93  94   BUN 6 - 24 mg/dL 9  13  10    Creatinine 0.57 - 1.00 mg/dL 9.12  9.05  9.20   Sodium 134 - 144 mmol/L 139  139  139   Potassium 3.5 - 5.2 mmol/L 3.7  3.8  3.5   Chloride 96 - 106 mmol/L 96  105  104   CO2 20 - 29 mmol/L 26  23  26    Calcium 8.7 - 10.2 mg/dL 9.6  9.5  9.1   Total Protein 6.0 - 8.5 g/dL 7.0   7.3   Total Bilirubin 0.0 - 1.2 mg/dL 0.5   0.4   Alkaline Phos 44 - 121 IU/L 95   67   AST 0 - 40 IU/L 13   14   ALT 0 - 32 IU/L 17   18      Lab Results  Component Value Date   TSH 1.400 11/30/2022  02/27/2018 colonoscopy/ EGD with Dr. Saintclair at  [facility] for IDA, weight loss Colonoscopy, recall 3 years - Preparation of the colon was poor. - One 12 mm polyp in the sigmoid colon, removed with a hot snare. Resected and retrieved. - Stool in the recto- sigmoid colon, in the sigmoid colon, in the descending colon, in the transverse colon, at the hepatic flexure, in the ascending colon, in the cecum and at the appendiceal orifice. - The distal rectum  and anal verge are normal on retroflexion view. Path: Colon, polyp(s), sigmoid - TUBULAR ADENOMA(S). - HIGH GRADE DYSPLASIA IS NOT IDENTIFIED. EGD - Normal esophagus. - Z- line regular, 35 cm from the incisors. - Erythematous mucosa in the antrum. Biopsied. - Normal examined duodenum. Biopsied. Path: 1. Duodenum, Biopsy - BENIGN SMALL BOWEL MUCOSA. - NO ACTIVE INFLAMMATION OR VILLOUS ATROPHY IDENTIFIED. 2. Stomach, biopsy, antrum - REACTIVE GASTROPATHY/CHEMICAL GASTRITIS. - NO HELICOBACTER PYLORI ORGANISMS ARE IDENTIFIED (WARTHIN-STARR STAIN). - NEGATIVE FOR ATYPIA OR MALIGNANCY.  Assessment: Encounter Diagnosis  Name Primary?   History of colonic polyps Yes     54 year old female patient with history of colon polyps, tubular adenomas, due for colon screening colonoscopy, will go ahead and schedule in [facility] with extra prep (poor prep last procedure) with Dr. Charlanne. Currently no GI symptoms. Hold GLP1.  Plan: -Schedule for a colonoscopy with 2 day prep in [facility] with Dr. Charlanne. The risks and benefits of colonoscopy with possible polypectomy / biopsies were discussed and the patient agrees to proceed.  -hold Semaglutide 1 week before   Thank you for the courtesy of this consult. Please call me with any questions or concerns.   Makeya Hilgert, FNP-C [facility] 11/23/2023, 8:46 AM  Cc: Knute Thersia Bitters, *

## 2024-01-19 ENCOUNTER — Encounter: Payer: Self-pay | Admitting: Gastroenterology

## 2024-01-26 ENCOUNTER — Ambulatory Visit (AMBULATORY_SURGERY_CENTER): Admitting: Gastroenterology

## 2024-01-26 ENCOUNTER — Encounter: Payer: Self-pay | Admitting: Gastroenterology

## 2024-01-26 VITALS — BP 125/68 | HR 74 | Temp 98.1°F | Resp 15 | Ht 62.0 in | Wt 196.0 lb

## 2024-01-26 DIAGNOSIS — K640 First degree hemorrhoids: Secondary | ICD-10-CM | POA: Diagnosis not present

## 2024-01-26 DIAGNOSIS — Z860101 Personal history of adenomatous and serrated colon polyps: Secondary | ICD-10-CM | POA: Diagnosis not present

## 2024-01-26 DIAGNOSIS — Z1211 Encounter for screening for malignant neoplasm of colon: Secondary | ICD-10-CM | POA: Diagnosis present

## 2024-01-26 DIAGNOSIS — K5730 Diverticulosis of large intestine without perforation or abscess without bleeding: Secondary | ICD-10-CM

## 2024-01-26 DIAGNOSIS — Z860100 Personal history of colon polyps, unspecified: Secondary | ICD-10-CM

## 2024-01-26 DIAGNOSIS — D120 Benign neoplasm of cecum: Secondary | ICD-10-CM

## 2024-01-26 DIAGNOSIS — D125 Benign neoplasm of sigmoid colon: Secondary | ICD-10-CM | POA: Diagnosis not present

## 2024-01-26 DIAGNOSIS — K635 Polyp of colon: Secondary | ICD-10-CM | POA: Diagnosis not present

## 2024-01-26 MED ORDER — SODIUM CHLORIDE 0.9 % IV SOLN: 500.0000 mL | Freq: Once | INTRAVENOUS | Status: DC

## 2024-01-26 NOTE — Progress Notes (Signed)
 [facility] History and Physical   Primary Care Physician:  Knute Thersia Bitters, FNP   Reason for Procedure:   H/O polyps  Plan:    colon     HPI: Brittany Cherry is a 54 y.o. female    Past Medical History:  Diagnosis Date   Adenoma of right adrenal gland 02/15/2023   AMA (advanced maternal age) multigravida 35+    Anemia    Anxiety    Arthritis    Blood transfusion without reported diagnosis    Depression    GERD (gastroesophageal reflux disease)    Headache    Hyperemesis gravidarum    Hypertension     Past Surgical History:  Procedure Laterality Date   CHOLECYSTECTOMY N/A 04/08/2020   Procedure: LAPAROSCOPIC CHOLECYSTECTOMY;  Surgeon: Dasie Leonor CROME, MD;  Location: [facility] OR;  Service: General;  Laterality: N/A;   COLONOSCOPY WITH PROPOFOL  N/A 02/27/2017   Procedure: COLONOSCOPY WITH PROPOFOL ;  Surgeon: Saintclair Jasper, MD;  Location: [facility] ENDOSCOPY;  Service: Gastroenterology;  Laterality: N/A;   DILATION AND CURETTAGE OF UTERUS     ESOPHAGOGASTRODUODENOSCOPY (EGD) WITH PROPOFOL  N/A 02/27/2017   Procedure: ESOPHAGOGASTRODUODENOSCOPY (EGD) WITH PROPOFOL ;  Surgeon: Saintclair Jasper, MD;  Location: [facility] ENDOSCOPY;  Service: Gastroenterology;  Laterality: N/A;   KNEE ARTHROSCOPY Bilateral    TUBAL LIGATION Bilateral 08/18/2013   Procedure: POST PARTUM TUBAL LIGATION;  Surgeon: Vonn VEAR Inch, MD;  Location: [facility] ORS;  Service: Gynecology;  Laterality: Bilateral;   UPPER GASTROINTESTINAL ENDOSCOPY     WISDOM TOOTH EXTRACTION      Prior to Admission medications   Medication Sig Start Date End Date Taking? Authorizing Provider  busPIRone  (BUSPAR ) 7.5 MG tablet Take 1 tablet (7.5 mg total) by mouth 2 (two) times daily. 03/29/23  Yes Caudle, Thersia Bitters, FNP  hydrochlorothiazide  (HYDRODIURIL ) 25 MG tablet Take 1 tablet (25 mg total) by mouth daily. 04/06/23  Yes Caudle, Thersia Bitters, FNP  losartan  (COZAAR ) 25 MG tablet Take 1 tablet (25 mg total) by mouth daily.  02/15/23  Yes Caudle, Thersia Bitters, FNP  hydrALAZINE  (APRESOLINE ) 10 MG tablet Take 1 tablet (10 mg total) by mouth 2 (two) times daily as needed (if BP is over 150/90). 11/30/22   Caudle, Thersia Bitters, FNP  Semaglutide-Weight Management 0.5 MG/0.5ML SOAJ Inject 0.5 mg into the skin.    [provider]    Current Outpatient Medications  Medication Sig Dispense Refill   busPIRone  (BUSPAR ) 7.5 MG tablet Take 1 tablet (7.5 mg total) by mouth 2 (two) times daily. 60 tablet 5   hydrochlorothiazide  (HYDRODIURIL ) 25 MG tablet Take 1 tablet (25 mg total) by mouth daily. 90 tablet 3   losartan  (COZAAR ) 25 MG tablet Take 1 tablet (25 mg total) by mouth daily. 90 tablet 3   hydrALAZINE  (APRESOLINE ) 10 MG tablet Take 1 tablet (10 mg total) by mouth 2 (two) times daily as needed (if BP is over 150/90). 45 tablet 0   Semaglutide-Weight Management 0.5 MG/0.5ML SOAJ Inject 0.5 mg into the skin.     No current facility-administered medications for this visit.    Allergies as of 01/26/2024 - Review Complete 01/26/2024  Allergen Reaction Noted   Ativan  [lorazepam ] Itching 04/09/2018    Family History  Problem Relation Age of Onset   Hypertension Mother    Hyperlipidemia Mother    Pancreatic cancer Maternal Aunt    Breast cancer Maternal Grandmother    Leukemia Paternal Grandfather    Colon cancer Neg Hx    Esophageal cancer Neg  Hx    Rectal cancer Neg Hx    Stomach cancer Neg Hx     Social History   Socioeconomic History   Marital status: Married    Spouse name: Not on file   Number of children: Not on file   Years of education: Not on file   Highest education level: Not on file  Occupational History   Not on file  Tobacco Use   Smoking status: Former    Current packs/day: 0.00    Types: Cigarettes    Quit date: 09/09/2016    Years since quitting: 7.3   Smokeless tobacco: Never  Vaping Use   Vaping status: Never Used  Substance and Sexual Activity   Alcohol use: Yes     Comment: occasionally   Drug use: No   Sexual activity: Yes    Birth control/protection: Surgical  Other Topics Concern   Not on file  Social History Narrative   Not on file   Social Drivers of Health   Financial Resource Strain: Low Risk  (02/15/2023)   Overall Financial Resource Strain (CARDIA)    Difficulty of Paying Living Expenses: Not hard at all  Food Insecurity: No Food Insecurity (02/15/2023)   Hunger Vital Sign    Worried About Running Out of Food in the Last Year: Never true    Ran Out of Food in the Last Year: Never true  Transportation Needs: No Transportation Needs (02/15/2023)   PRAPARE - Administrator, Civil Service (Medical): No    Lack of Transportation (Non-Medical): No  Physical Activity: Inactive (02/15/2023)   Exercise Vital Sign    Days of Exercise per Week: 0 days    Minutes of Exercise per Session: 0 min  Stress: Stress Concern Present (02/15/2023)   [employer] of Occupational Health - Occupational Stress Questionnaire    Feeling of Stress : To some extent  Social Connections: Moderately Isolated (02/15/2023)   Social Connection and Isolation Panel    Frequency of Communication with Friends and Family: More than three times a week    Frequency of Social Gatherings with Friends and Family: Twice a week    Attends Religious Services: Never    Database administrator or Organizations: No    Attends Banker Meetings: Never    Marital Status: Married  Catering manager Violence: Not At Risk (02/15/2023)   Humiliation, Afraid, Rape, and Kick questionnaire    Fear of Current or Ex-Partner: No    Emotionally Abused: No    Physically Abused: No    Sexually Abused: No    Review of Systems: Positive for none All other review of systems negative except as mentioned in the HPI.  Physical Exam: Vital signs in last 24 hours: @VSRANGES @   General:   Alert,  Well-developed, well-nourished, pleasant and cooperative in NAD Lungs:   Clear throughout to auscultation.   Heart:  Regular rate and rhythm; no murmurs, clicks, rubs,  or gallops. Abdomen:  Soft, nontender and nondistended. Normal bowel sounds.   Neuro/Psych:  Alert and cooperative. Normal mood and affect. A and O x 3    No significant changes were identified.  The patient continues to be an appropriate candidate for the planned procedure and anesthesia.   Anselm Bring, MD. [facility] 01/26/2024 3:55 PM@

## 2024-01-26 NOTE — Progress Notes (Signed)
 Called to room to assist during endoscopic procedure.  Patient ID and intended procedure confirmed with present staff. Received instructions for my participation in the procedure from the performing physician.

## 2024-01-26 NOTE — Op Note (Signed)
 [facility] Patient Name: Brittany Cherry Procedure Date: 01/26/2024 12:12 PM MRN: [id number] Endoscopist: Lynnie Bring , MD, [id number] Age: 53 Referring MD:  Date of Birth: 12/25/1970 Gender: Female Account #: [id number] Procedure:                Colonoscopy Indications:              High risk colon cancer surveillance: Personal                            history of colonic polyps Medicines:                Monitored Anesthesia Care Procedure:                Pre-Anesthesia Assessment:                           - Prior to the procedure, a History and Physical                            was performed, and patient medications and                            allergies were reviewed. The patient's tolerance of                            previous anesthesia was also reviewed. The risks                            and benefits of the procedure and the sedation                            options and risks were discussed with the patient.                            All questions were answered, and informed consent                            was obtained. Prior Anticoagulants: The patient has                            taken no anticoagulant or antiplatelet agents. ASA                            Grade Assessment: II - A patient with mild systemic                            disease. After reviewing the risks and benefits,                            the patient was deemed in satisfactory condition to                            undergo the procedure.  After obtaining informed consent, the colonoscope                            was passed under direct vision. Throughout the                            procedure, the patient's blood pressure, pulse, and                            oxygen  saturations were monitored continuously. The                            CF [id number] #[id number] was introduced through the anus                            and advanced to the the cecum,  identified by                            appendiceal orifice and ileocecal valve. The                            colonoscopy was performed without difficulty. The                            patient tolerated the procedure well. The quality                            of the bowel preparation was good. The ileocecal                            valve, appendiceal orifice, and rectum were                            photographed. Scope In: 12:21:35 PM Scope Out: 12:38:10 PM Scope Withdrawal Time: 0 hours 11 minutes 13 seconds  Total Procedure Duration: 0 hours 16 minutes 35 seconds  Findings:                 An 8 mm polyp was found in the cecum. The polyp was                            sessile. The polyp was removed with a cold snare.                            Resection and retrieval were complete.                           A 4 mm polyp was found in the distal sigmoid colon.                            The polyp was sessile. The polyp was removed with a  cold snare. Resection and retrieval were complete.                           Many small-mouthed diverticula were found in the                            sigmoid colon.                           Non-bleeding internal hemorrhoids were found during                            retroflexion. The hemorrhoids were small and Grade                            I (internal hemorrhoids that do not prolapse).                           Retroflexion in the right colon was performed.                           The exam was otherwise without abnormality on                            direct and retroflexion views. Complications:            No immediate complications. Estimated Blood Loss:     Estimated blood loss: none. Impression:               - One 8 mm polyp in the cecum, removed with a cold                            snare. Resected and retrieved.                           - One 4 mm polyp in the distal sigmoid colon,                             removed with a cold snare. Resected and retrieved.                           - Mild sigmoid diverticulosis.                           - Non-bleeding internal hemorrhoids.                           - The examination was otherwise normal on direct                            and retroflexion views. Recommendation:           - Patient has a contact number available for                            emergencies. The signs and symptoms of potential  delayed complications were discussed with the                            patient. Return to normal activities tomorrow.                            Written discharge instructions were provided to the                            patient.                           - Resume previous high-fiber diet.                           - Continue present medications.                           - Await pathology results.                           - Repeat colonoscopy for surveillance based on                            pathology results.                           - The findings and recommendations were discussed                            with the patient's family. Lynnie Bring, MD 01/26/2024 12:44:18 PM This report has been signed electronically.

## 2024-01-26 NOTE — Progress Notes (Signed)
 A/o x 3, VSS, good SR's, pleased with anesthesia, report to RN

## 2024-01-26 NOTE — Patient Instructions (Signed)
-  Handout on polyps, diverticulosis and hemorrhoids provided -Await pathology results  YOU HAD AN ENDOSCOPIC PROCEDURE TODAY AT THE [facility]:   Refer to the procedure report that was given to you for any specific questions about what was found during the examination.  If the procedure report does not answer your questions, please call your gastroenterologist to clarify.  If you requested that your care partner not be given the details of your procedure findings, then the procedure report has been included in a sealed envelope for you to review at your convenience later.  YOU SHOULD EXPECT: Some feelings of bloating in the abdomen. Passage of more gas than usual.  Walking can help get rid of the air that was put into your GI tract during the procedure and reduce the bloating. If you had a lower endoscopy (such as a colonoscopy or flexible sigmoidoscopy) you may notice spotting of blood in your stool or on the toilet paper. If you underwent a bowel prep for your procedure, you may not have a normal bowel movement for a few days.  Please Note:  You might notice some irritation and congestion in your nose or some drainage.  This is from the oxygen used during your procedure.  There is no need for concern and it should clear up in a day or so.  SYMPTOMS TO REPORT IMMEDIATELY:  Following lower endoscopy (colonoscopy or flexible sigmoidoscopy):  Excessive amounts of blood in the stool  Significant tenderness or worsening of abdominal pains  Swelling of the abdomen that is new, acute  Fever of 100F or higher  For urgent or emergent issues, a gastroenterologist can be reached at any hour by calling [phone]. Do not use MyChart messaging for urgent concerns.    DIET:  We do recommend a small meal at first, but then you may proceed to your regular diet.  Drink plenty of fluids but you should avoid alcoholic beverages for 24 hours.  ACTIVITY:  You should plan to take it easy for the  rest of today and you should NOT DRIVE or use heavy machinery until tomorrow (because of the sedation medicines used during the test).    FOLLOW UP: Our staff will call the number listed on your records the next business day following your procedure.  We will call around 7:15- 8:00 am to check on you and address any questions or concerns that you may have regarding the information given to you following your procedure. If we do not reach you, we will leave a message.     If any biopsies were taken you will be contacted by phone or by letter within the next 1-3 weeks.  Please call us  at [phone] if you have not heard about the biopsies in 3 weeks.    SIGNATURES/CONFIDENTIALITY: You and/or your care partner have signed paperwork which will be entered into your electronic medical record.  These signatures attest to the fact that that the information above on your After Visit Summary has been reviewed and is understood.  Full responsibility of the confidentiality of this discharge information lies with you and/or your care-partner.

## 2024-01-29 ENCOUNTER — Telehealth: Payer: Self-pay

## 2024-01-29 NOTE — Telephone Encounter (Signed)
  Follow up Call-     01/26/2024   10:57 AM  Call back number  Post procedure Call Back phone  # [phone]  Permission to leave phone message Yes     Patient questions:  Do you have a fever, pain , or abdominal swelling? No. Pain Score  0 *  Have you tolerated food without any problems? Yes.    Have you been able to return to your normal activities? Yes.    Do you have any questions about your discharge instructions: Diet   No. Medications  No. Follow up visit  No.  Do you have questions or concerns about your Care? No.  Actions: * If pain score is 4 or above: No action needed, pain <4.

## 2024-01-30 LAB — SURGICAL PATHOLOGY

## 2024-01-31 ENCOUNTER — Encounter (HOSPITAL_BASED_OUTPATIENT_CLINIC_OR_DEPARTMENT_OTHER): Payer: Self-pay | Admitting: Family Medicine

## 2024-01-31 ENCOUNTER — Other Ambulatory Visit (HOSPITAL_BASED_OUTPATIENT_CLINIC_OR_DEPARTMENT_OTHER): Payer: Self-pay | Admitting: Family Medicine

## 2024-01-31 MED ORDER — BUSPIRONE HCL 7.5 MG PO TABS: 7.5000 mg | ORAL_TABLET | Freq: Two times a day (BID) | ORAL | 3 refills | Status: DC

## 2024-01-31 NOTE — Telephone Encounter (Signed)
 Please see mychart message sent by pt and advise.

## 2024-02-03 ENCOUNTER — Ambulatory Visit: Payer: Self-pay | Admitting: Gastroenterology

## 2024-02-08 ENCOUNTER — Ambulatory Visit (INDEPENDENT_AMBULATORY_CARE_PROVIDER_SITE_OTHER): Admitting: Family Medicine

## 2024-02-08 ENCOUNTER — Encounter (HOSPITAL_BASED_OUTPATIENT_CLINIC_OR_DEPARTMENT_OTHER): Payer: Self-pay | Admitting: Family Medicine

## 2024-02-08 VITALS — BP 129/84 | HR 74 | Ht 62.0 in | Wt 199.6 lb

## 2024-02-08 DIAGNOSIS — R0789 Other chest pain: Secondary | ICD-10-CM | POA: Insufficient documentation

## 2024-02-08 DIAGNOSIS — H93A9 Pulsatile tinnitus, unspecified ear: Secondary | ICD-10-CM | POA: Diagnosis not present

## 2024-02-08 DIAGNOSIS — E611 Iron deficiency: Secondary | ICD-10-CM | POA: Diagnosis not present

## 2024-02-08 DIAGNOSIS — Z0000 Encounter for general adult medical examination without abnormal findings: Secondary | ICD-10-CM | POA: Diagnosis not present

## 2024-02-08 DIAGNOSIS — E782 Mixed hyperlipidemia: Secondary | ICD-10-CM | POA: Diagnosis not present

## 2024-02-08 DIAGNOSIS — E669 Obesity, unspecified: Secondary | ICD-10-CM

## 2024-02-08 DIAGNOSIS — F419 Anxiety disorder, unspecified: Secondary | ICD-10-CM

## 2024-02-08 DIAGNOSIS — I10 Essential (primary) hypertension: Secondary | ICD-10-CM | POA: Diagnosis not present

## 2024-02-08 MED ORDER — BUSPIRONE HCL 10 MG PO TABS: 10.0000 mg | ORAL_TABLET | Freq: Two times a day (BID) | ORAL | 3 refills | Status: AC

## 2024-02-08 MED ORDER — HYDROCHLOROTHIAZIDE 12.5 MG PO TABS: 12.5000 mg | ORAL_TABLET | Freq: Every day | ORAL | 3 refills | Status: AC

## 2024-02-08 NOTE — Patient Instructions (Addendum)
 Get Shingrix Vaccines in Pharmacy   Check Blood pressure at least 2-3x a week with cuff at home.   If chest pain occurs, please go to ER.

## 2024-02-08 NOTE — Progress Notes (Signed)
 Subjective:   Brittany Cherry March 07, 1971  02/08/2024   CC: Chief Complaint  Patient presents with   Annual Exam    Patient is here today for her physical. States she has been having some problems with her ear that she wants to discuss.    HPI: Brittany Cherry is a 53 y.o. female who presents for a routine health maintenance exam.  Labs collected at time of visit.    CHEST PAINS:  Patient states she is having angina- like pain that is recurring daily. She is having pain radiating to left arm and having weakness to left arm when chest pain is occurring. She denies SHOB, n/v, diaphoresis when pain occurs.     TINNITUS:  She states she is having some swooshing sound in her right ear. She states this is causing significant anxiety and worry.  She has had dizziness and believed it to be vertigo. She states this has been going on for several years. She is concerned about possible cardiovascular concerns and inner ear issues.    WEIGHT:  Patient is using her compounded Semaglutide through [facility] pharmacy for weight loss. She is tolerating well currently. She is using her dosage every other week.   Wt Readings from Last 3 Encounters:  02/08/24 199 lb 9.6 oz (90.5 kg)  01/26/24 196 lb (88.9 kg)  11/23/23 196 lb 4 oz (89 kg)    ANXIETY:  Patient states she is going to reach out to therapist to discuss ongoing anxiety and stress. She is interested in increasing her Buspar  to help. She has had increased external stressors with work, family that has worsened her anxiety. Denies concerns of depression at this time.   HEALTH SCREENINGS: - Vision Screening: up to date - Dental Visits: up to date - Pap smear: up to date - Breast Exam: Declined - STD Screening: Declined - Mammogram (40+): Up to date  - Colonoscopy (45+): Up to date  - Bone Density (65+ or under 65 with predisposing conditions): Not applicable  - Lung CA screening with low-dose CT:  Not applicable Adults  age 66-80 who are current cigarette smokers or quit within the last 15 years. Must have 20 pack year history.   Depression and Anxiety Screen done today and results listed below:     02/08/2024    7:53 AM 08/24/2023    1:55 PM 02/15/2023    8:34 AM 12/16/2022    8:21 AM 11/30/2022    8:40 AM  Depression screen PHQ 2/9  Decreased Interest 0 0 1 1 0  Down, Depressed, Hopeless 0 0 0 1 1  PHQ - 2 Score 0 0 1 2 1   Altered sleeping 3 0 1 1 1   Tired, decreased energy 3 0 1 1 1   Change in appetite 0 0 0 0 1  Feeling bad or failure about yourself  0 0 0 0 0  Trouble concentrating 0 0 0 0 0  Moving slowly or fidgety/restless 0 0 0 0 0  Suicidal thoughts 0 0 0 0 0  PHQ-9 Score 6 0 3 4 4   Difficult doing work/chores Not difficult at all Not difficult at all Not difficult at all Not difficult at all Not difficult at all      02/08/2024    7:54 AM 08/24/2023    1:56 PM 02/15/2023    8:34 AM 12/16/2022    8:21 AM  GAD 7 : Generalized Anxiety Score  Nervous, Anxious, on Edge 3 1 1 1   Control/stop worrying  3 1 1  0  Worry too much - different things 3 1 1 1   Trouble relaxing 3 1 1 1   Restless 0 0 0 0  Easily annoyed or irritable 3 1 3 1   Afraid - awful might happen 1 0 0 0  Total GAD 7 Score 16 5 7 4   Anxiety Difficulty Not difficult at all Not difficult at all Not difficult at all Not difficult at all    IMMUNIZATIONS: - Tdap: Tetanus vaccination status reviewed: last tetanus booster within 10 years. - HPV: Not applicable - Influenza: Refused - Pneumovax: Not applicable - Prevnar 20: Not applicable - Shingrix (50+): Patient will obtain in pharmacy    Past medical history, surgical history, medications, allergies, family history and social history reviewed with patient today and changes made to appropriate areas of the chart.   Past Medical History:  Diagnosis Date   Adenoma of right adrenal gland 02/15/2023   Allergy    AMA (advanced maternal age) multigravida 35+    Anemia     Anxiety    Arthritis    Blood transfusion without reported diagnosis    Depression    GERD (gastroesophageal reflux disease)    Headache    Hyperemesis gravidarum    Hypertension    Sleep apnea    Suspect    Past Surgical History:  Procedure Laterality Date   CHOLECYSTECTOMY N/A 04/08/2020   Procedure: LAPAROSCOPIC CHOLECYSTECTOMY;  Surgeon: Dasie Leonor CROME, MD;  Location: [facility] OR;  Service: General;  Laterality: N/A;   COLONOSCOPY WITH PROPOFOL  N/A 02/27/2017   Procedure: COLONOSCOPY WITH PROPOFOL ;  Surgeon: Saintclair Jasper, MD;  Location: [facility] ENDOSCOPY;  Service: Gastroenterology;  Laterality: N/A;   DILATION AND CURETTAGE OF UTERUS     ESOPHAGOGASTRODUODENOSCOPY (EGD) WITH PROPOFOL  N/A 02/27/2017   Procedure: ESOPHAGOGASTRODUODENOSCOPY (EGD) WITH PROPOFOL ;  Surgeon: Saintclair Jasper, MD;  Location: [facility] ENDOSCOPY;  Service: Gastroenterology;  Laterality: N/A;   KNEE ARTHROSCOPY Bilateral    TUBAL LIGATION Bilateral 08/18/2013   Procedure: POST PARTUM TUBAL LIGATION;  Surgeon: Vonn VEAR Inch, MD;  Location: [facility] ORS;  Service: Gynecology;  Laterality: Bilateral;   UPPER GASTROINTESTINAL ENDOSCOPY     WISDOM TOOTH EXTRACTION      Current Outpatient Medications on File Prior to Visit  Medication Sig   hydrALAZINE  (APRESOLINE ) 10 MG tablet Take 1 tablet (10 mg total) by mouth 2 (two) times daily as needed (if BP is over 150/90).   losartan  (COZAAR ) 25 MG tablet Take 1 tablet (25 mg total) by mouth daily.   Semaglutide-Weight Management 0.5 MG/0.5ML SOAJ Inject 0.5 mg into the skin.   No current facility-administered medications on file prior to visit.    Allergies  Allergen Reactions   Ativan  [Lorazepam ] Itching    Feet, Hands, Face     Social History   Socioeconomic History   Marital status: Married    Spouse name: Not on file   Number of children: Not on file   Years of education: Not on file   Highest education level: Associate degree: academic program  Occupational History    Not on file  Tobacco Use   Smoking status: Former    Current packs/day: 0.00    Types: Cigarettes    Quit date: 09/09/2016    Years since quitting: 7.4   Smokeless tobacco: Never  Vaping Use   Vaping status: Never Used  Substance and Sexual Activity   Alcohol use: Yes    Comment: occasionally   Drug use: No  Sexual activity: Yes    Birth control/protection: Surgical  Other Topics Concern   Not on file  Social History Narrative   Not on file   Social Drivers of Health   Financial Resource Strain: Low Risk  (02/04/2024)   Overall Financial Resource Strain (CARDIA)    Difficulty of Paying Living Expenses: Not hard at all  Food Insecurity: No Food Insecurity (02/04/2024)   Hunger Vital Sign    Worried About Running Out of Food in the Last Year: Never true    Ran Out of Food in the Last Year: Never true  Transportation Needs: No Transportation Needs (02/04/2024)   PRAPARE - Administrator, Civil Service (Medical): No    Lack of Transportation (Non-Medical): No  Physical Activity: Inactive (02/04/2024)   Exercise Vital Sign    Days of Exercise per Week: 0 days    Minutes of Exercise per Session: Not on file  Stress: Stress Concern Present (02/04/2024)   [employer] of Occupational Health - Occupational Stress Questionnaire    Feeling of Stress: Very much  Social Connections: Moderately Isolated (02/04/2024)   Social Connection and Isolation Panel    Frequency of Communication with Friends and Family: More than three times a week    Frequency of Social Gatherings with Friends and Family: Once a week    Attends Religious Services: 1 to 4 times per year    Active Member of [employer] or Organizations: No    Attends Engineer, Structural: Not on file    Marital Status: Separated  Intimate Partner Violence: Not At Risk (02/15/2023)   Humiliation, Afraid, Rape, and Kick questionnaire    Fear of Current or Ex-Partner: No    Emotionally Abused: No     Physically Abused: No    Sexually Abused: No   Social History   Tobacco Use  Smoking Status Former   Current packs/day: 0.00   Types: Cigarettes   Quit date: 09/09/2016   Years since quitting: 7.4  Smokeless Tobacco Never   Social History   Substance and Sexual Activity  Alcohol Use Yes   Comment: occasionally    Family History  Problem Relation Age of Onset   Hypertension Mother    Hyperlipidemia Mother    Anxiety disorder Mother    Arthritis Mother    Pancreatic cancer Maternal Aunt    Breast cancer Maternal Grandmother    Leukemia Paternal Grandfather    Cancer Maternal Aunt    Intellectual disability Son    Colon cancer Neg Hx    Esophageal cancer Neg Hx    Rectal cancer Neg Hx    Stomach cancer Neg Hx      ROS: Denies fever, fatigue, unexplained weight loss/gain, chest pain, SHOB, and palpitations. Denies neurological deficits, gastrointestinal or genitourinary complaints, and skin changes.   Objective:   Today's Vitals   02/08/24 0749  BP: 129/84  Pulse: 74  SpO2: 98%  Weight: 199 lb 9.6 oz (90.5 kg)  Height: 5' 2 (1.575 m)    GENERAL APPEARANCE: Well-appearing, in NAD. Well nourished.  SKIN: Pink, warm and dry. Turgor normal. No rash, lesion, ulceration, or ecchymoses. Hair evenly distributed.  HEENT: HEAD: Normocephalic.  EYES: PERRLA. EOMI. Lids intact w/o defect. Sclera white, Conjunctiva pink w/o exudate.  EARS: External ear w/o redness, swelling, masses or lesions. EAC clear. TM's intact, translucent w/o bulging, appropriate landmarks visualized. Appropriate acuity to conversational tones.  NOSE: Septum midline w/o deformity. Nares patent, mucosa pink and non-inflamed w/o  drainage. No sinus tenderness.  THROAT: Uvula midline. Oropharynx clear. Tonsils non-inflamed w/o exudate. Oral mucosa pink and moist.  NECK: Supple, Trachea midline. Full ROM w/o pain or tenderness. No lymphadenopathy. Thyroid  non-tender w/o enlargement or palpable masses.   RESPIRATORY: Chest wall symmetrical w/o masses. Respirations even and non-labored. Breath sounds clear to auscultation bilaterally. No wheezes, rales, rhonchi, or crackles. CARDIAC: S1, S2 present, regular rate and rhythm. No gallops, murmurs, rubs, or clicks. PMI w/o lifts, heaves, or thrills. No carotid bruits or thrills. . Capillary refill <2 seconds. Peripheral pulses 2+ bilaterally. No chest wall pain reproducible.  GI: Abdomen soft w/o distention. Normoactive bowel sounds. No palpable masses or tenderness. No guarding or rebound tenderness. Liver and spleen w/o tenderness or enlargement. No CVA tenderness.  MSK: Muscle tone and strength appropriate for age, w/o atrophy or abnormal movement.  EXTREMITIES: Active ROM intact, w/o tenderness, crepitus, or contracture. No obvious joint deformities or effusions. No clubbing, edema, or cyanosis.  NEUROLOGIC: CN's II-XII intact. Motor strength symmetrical with no obvious weakness. No sensory deficits. DTR's 2+ symmetric bilaterally. Steady, even gait.  PSYCH/MENTAL STATUS: Alert, oriented x 3. Cooperative, appropriate mood and affect.   EKG:  Vent. rate 68 BPM PR interval 140 ms QRS duration 82 ms QT/QTcB 408/433 ms P-R-T axes 28 1 23   Normal sinus rhythm Normal ECG When compared with ECG of 18-May-2021 14:21, No significant change was found.     Assessment & Plan:  1. Annual physical exam (Primary) Discussed preventative screenings, vaccines, and healthy lifestyle with patient. Labs collected today. Will obtain flu shot with worksite.  - CBC with Differential/Platelet - Comprehensive metabolic panel with GFR - TSH  2. Primary hypertension Patient desiring to decrease hydrochlorothiazide  due to recent weight loss. Will trial 12.5mg  hydrochlorothiazide  daily and keep record of BP. Patient is an [employer] and can regularly check this. Will follow up in 3 months for BP check.  - hydrochlorothiazide  (HYDRODIURIL ) 12.5 MG tablet; Take 1 tablet  (12.5 mg total) by mouth daily.  Dispense: 90 tablet; Refill: 3  3. Iron deficiency Will check Iron and CBC with labs today. Pt currently asymptomatic.  - Iron, TIBC and Ferritin Panel  4. Mixed hyperlipidemia Previoulsy controlled with diet and exercise. Will check LP with labs today.  - Lipid panel  5. Anxiety Uncontrolled currently due to stresor. Will increase Buspar  to 10mg  BID. Safe use reviewed with patient and will follow up if no improvement.  - busPIRone  (BUSPAR ) 10 MG tablet; Take 1 tablet (10 mg total) by mouth 2 (two) times daily.  Dispense: 180 tablet; Refill: 3  6. Obesity (BMI 30-39.9) Discussed ongoing weight loss and progress. Will continue nutrition, exercise and compounded Glp 1.   7. Other chest pain EKG is unremarkable for acute changes. She is currently asymptomatic in office. Given ongoing angina-like pain, will refer to Cardiology for further evaluation. Discussed red flag signs and symptoms to seek ER for.  - Ambulatory referral to Cardiology  8. Pulsatile tinnitus Given duration and unremarkable exam in office, recommend further evaluation for pulsatile tinnitus with ENT. Pt agreeable and referral placed.  - Ambulatory referral to ENT   Orders Placed This Encounter  Procedures   CBC with Differential/Platelet   Comprehensive metabolic panel with GFR   Lipid panel   TSH   Iron, TIBC and Ferritin Panel   Ambulatory referral to ENT    Referral Priority:   Routine    Referral Type:   Consultation    Referral Reason:  Specialty Services Required    Requested Specialty:   Otolaryngology    Number of Visits Requested:   1   Ambulatory referral to Cardiology    Referral Priority:   Routine    Referral Type:   Consultation    Referral Reason:   Specialty Services Required    Number of Visits Requested:   1   EKG 12-Lead    Ordered by an unspecified provider     PATIENT COUNSELING:  - Encouraged a healthy well-balanced diet. Patient may adjust  caloric intake to maintain or achieve ideal body weight. May reduce intake of dietary saturated fat and total fat and have adequate dietary potassium and calcium preferably from fresh fruits, vegetables, and low-fat dairy products.   - Advised to avoid cigarette smoking. - Discussed with the patient that most people either abstain from alcohol or drink within safe limits (<=14/week and <=4 drinks/occasion for males, <=7/weeks and <= 3 drinks/occasion for females) and that the risk for alcohol disorders and other health effects rises proportionally with the number of drinks per week and how often a drinker exceeds daily limits. - Discussed cessation/primary prevention of drug use and availability of treatment for abuse.  - Discussed sexually transmitted diseases, avoidance of unintended pregnancy and contraceptive alternatives.  - Stressed the importance of regular exercise - Injury prevention: Discussed safety belts, safety helmets, smoke detector, smoking near bedding or upholstery.  - Dental health: Discussed importance of regular tooth brushing, flossing, and dental visits.   NEXT PREVENTATIVE PHYSICAL DUE IN 1 YEAR.  Return for 3 months- BP check only; 1 year Annual Exam .  Patient to reach out to office if new, worrisome, or unresolved symptoms arise or if no improvement in patient's condition. Patient verbalized understanding and is agreeable to treatment plan. All questions answered to patient's satisfaction.    Thersia Schuyler Stark, [place]

## 2024-02-09 ENCOUNTER — Encounter (INDEPENDENT_AMBULATORY_CARE_PROVIDER_SITE_OTHER): Payer: Self-pay

## 2024-02-09 LAB — CBC WITH DIFFERENTIAL/PLATELET
Basophils Absolute: 0 x10E3/uL (ref 0.0–0.2)
Basos: 1 %
EOS (ABSOLUTE): 0.2 x10E3/uL (ref 0.0–0.4)
Eos: 3 %
Hematocrit: 42 % (ref 34.0–46.6)
Hemoglobin: 13.5 g/dL (ref 11.1–15.9)
Immature Grans (Abs): 0 x10E3/uL (ref 0.0–0.1)
Immature Granulocytes: 0 %
Lymphocytes Absolute: 1.5 x10E3/uL (ref 0.7–3.1)
Lymphs: 26 %
MCH: 28.3 pg (ref 26.6–33.0)
MCHC: 32.1 g/dL (ref 31.5–35.7)
MCV: 88 fL (ref 79–97)
Monocytes Absolute: 0.4 x10E3/uL (ref 0.1–0.9)
Monocytes: 6 %
Neutrophils Absolute: 3.7 x10E3/uL (ref 1.4–7.0)
Neutrophils: 63 %
Platelets: 318 x10E3/uL (ref 150–450)
RBC: 4.77 x10E6/uL (ref 3.77–5.28)
RDW: 13.3 % (ref 11.7–15.4)
WBC: 5.8 x10E3/uL (ref 3.4–10.8)

## 2024-02-09 LAB — COMPREHENSIVE METABOLIC PANEL WITH GFR
ALT: 21 IU/L (ref 0–32)
AST: 19 IU/L (ref 0–40)
Albumin: 4.6 g/dL (ref 3.8–4.9)
Alkaline Phosphatase: 90 IU/L (ref 49–135)
BUN/Creatinine Ratio: 14 (ref 9–23)
BUN: 11 mg/dL (ref 6–24)
Bilirubin Total: 0.6 mg/dL (ref 0.0–1.2)
CO2: 23 mmol/L (ref 20–29)
Calcium: 9.8 mg/dL (ref 8.7–10.2)
Chloride: 98 mmol/L (ref 96–106)
Creatinine, Ser: 0.76 mg/dL (ref 0.57–1.00)
Globulin, Total: 2.5 g/dL (ref 1.5–4.5)
Glucose: 89 mg/dL (ref 70–99)
Potassium: 4 mmol/L (ref 3.5–5.2)
Sodium: 138 mmol/L (ref 134–144)
Total Protein: 7.1 g/dL (ref 6.0–8.5)
eGFR: 94 mL/min/1.73

## 2024-02-09 LAB — LIPID PANEL
Chol/HDL Ratio: 4.3 ratio (ref 0.0–4.4)
Cholesterol, Total: 211 mg/dL — ABNORMAL HIGH (ref 100–199)
HDL: 49 mg/dL
LDL Chol Calc (NIH): 147 mg/dL — ABNORMAL HIGH (ref 0–99)
Triglycerides: 83 mg/dL (ref 0–149)
VLDL Cholesterol Cal: 15 mg/dL (ref 5–40)

## 2024-02-09 LAB — IRON,TIBC AND FERRITIN PANEL
Ferritin: 128 ng/mL (ref 15–150)
Iron Saturation: 26 % (ref 15–55)
Iron: 92 ug/dL (ref 27–159)
Total Iron Binding Capacity: 356 ug/dL (ref 250–450)
UIBC: 264 ug/dL (ref 131–425)

## 2024-02-09 LAB — TSH: TSH: 1.09 u[IU]/mL (ref 0.450–4.500)

## 2024-02-11 ENCOUNTER — Ambulatory Visit (HOSPITAL_BASED_OUTPATIENT_CLINIC_OR_DEPARTMENT_OTHER): Payer: Self-pay | Admitting: Family Medicine

## 2024-02-11 NOTE — Progress Notes (Signed)
 Hi Brittany Cherry,  Your cholesterol is stable. Your HDL has improved from 6 months prior. Starting cholesterol medication may help to lower this risk if you are interested. Blood counts and electrolytes, kidney and liver function is stable. Iron is within normal limits. Please schedule cardiology and ENT for further evaluation of concerns.

## 2024-02-12 NOTE — Progress Notes (Signed)
 Cardiology [facility]:    Date:  02/22/2024   ID:  Brittany Cherry, DOB 07/11/1970, MRN [id number]  PCP:  Knute Thersia Bitters, FNP  Cardiologist:  None     Referring MD: Knute Thersia Bitters, *   Chief Complaint: chest pain  History of Present Illness:    Brittany Cherry is a 53 y.o. female with a history of hypertension, iron deficiency anemia, GERD, anxiety/ depression, and obesity who presents today as a new patient in the [facility] for evaluation of chest pain.   Patient was recently seen by her PCP on 02/08/2024 at which time she reported recurrent chest pain. EKG showed normal sinus rhythm with no acute ischemic changes. Therefore, she was referred to Cardiology for further evaluation.   Patient reports sharp pin point pain in multiple locations including her left chest, left arm, and under he left breast. This occurs multiple times throughout the day and last for a couple of minutes before resolving on its own. Pain occurs both at rest and with exertion but it is not worse with exertion. This has been going on for a couple of years. She denies any shortness of breath, orthopnea, PND, or edema. She reports occasional dizziness that she attributes to her vertigo but denies any palpitations or syncope She also reports hot flashes/ night sweats for the last couple of years which she attributes she think is due to perimenopause.   Her other big concern today is pulsatile tinnitus in her right ear. She states she has had this for years but it is getting much worse. She has been referred to ENT and is scheduled to see them later this month but her PCP also recommended screening for carotid artery disease. She is also concerned that she may have sleep apnea.  She reports loud snoring, fatigue, and waking up with a bad headache.  She has a history of tobacco use but quit smoking 10 years ago.  She describes herself as a social drinker and states she may have 1-2 drinks  a month but nothing significant.  She denies any family history of cardiovascular disease but states her mother had a history of hypertension and hyperlipidemia.  EKGs/Labs/Other Studies Reviewed:    The following studies were reviewed:  N/A  EKG:  EKG not ordered today. Recent EKG from PCP's office on 02/08/2024 was personally reviewed and demonstrates normal sinus rhythm, rate 68 bpm, with no acute ST/ T wave changes suggestive of ischemia.   Recent Labs: 02/08/2024: ALT 21; BUN 11; Creatinine, Ser 0.76; Hemoglobin 13.5; Platelets 318; Potassium 4.0; Sodium 138; TSH 1.090  Recent Lipid Panel    Component Value Date/Time   CHOL 211 (H) 02/08/2024 0853   TRIG 83 02/08/2024 0853   HDL 49 02/08/2024 0853   CHOLHDL 4.3 02/08/2024 0853   LDLCALC 147 (H) 02/08/2024 0853    Physical Exam:    Vital Signs: BP 118/80   Pulse 80   Ht 5' 2 (1.575 m)   Wt 202 lb 12.8 oz (92 kg)   SpO2 98%   BMI 37.09 kg/m     Wt Readings from Last 3 Encounters:  02/22/24 202 lb 12.8 oz (92 kg)  02/08/24 199 lb 9.6 oz (90.5 kg)  01/26/24 196 lb (88.9 kg)     General: 53 y.o. [place] obese female in no acute distress. HEENT: Normocephalic and atraumatic. Sclera clear.  Neck: Supple. No carotid bruits. No JVD. Heart: RRR. Distinct S1 and S2. No murmurs, gallops, or  rubs.  Lungs: No increased work of breathing. Clear to ausculation bilaterally. No wheezes, rhonchi, or rales.  Extremities: No lower extremity edema.   Skin: Warm and dry. Neuro: No focal deficits. Psych: Normal affect. Responds appropriately.  Assessment:    1. Precordial pain   2. Pulsatile tinnitus   3. Primary hypertension   4. Class 2 obesity with body mass index (BMI) of 37.0 to 37.9 in adult, unspecified obesity type, unspecified whether serious comorbidity present   5. Snoring     Plan:    Chest Pain Patient describes intermittent daily chest pain that sounds rather atypical but states she has had this for  years.  Recent EKG at PCPs office on 02/08/2024 showed normal sinus rhythm with no acute ischemic changes. - Chest pain did not does not sound cardiac in nature but will order a coronary CTA to formally rule out obstructive CAD. Will provide a one-time dose of Lopressor 100 mg for patient to take 2 hours prior to CTA.  Pulsatile Tinnitus Patient describes pulsatile tinnitus in her right ear. - Will order carotid dopplers to rule rule out carotid artery disease as a cause. - She has also been referred to ENT and is scheduled to see them later this month.  Hypertension BP well controlled. - Continue current medications: Losartan  25mg  daily and HCTZ 12.5mg  daily. Also has Hydralazine  10mg  to take as needed for BP >150/90.   Obesity BMI 37.09 - On Ozempic.   Snoring Suspect Sleep Apnea Patient reports loud snoring, fatigue, waking up with a bad headache.  She is concerned that she may have sleep apnea. - STOP-BANG score = 5 suggesting she is high risk for struct of sleep apnea.  Will order an Itmar home sleep study.  Disposition: Follow up in 2 months.    Signed, Aline FORBES Door, PA-C  02/22/2024 9:06 PM    [facility]

## 2024-02-22 ENCOUNTER — Encounter: Payer: Self-pay | Admitting: Student

## 2024-02-22 ENCOUNTER — Ambulatory Visit: Attending: Student | Admitting: Student

## 2024-02-22 VITALS — BP 118/80 | HR 80 | Ht 62.0 in | Wt 202.8 lb

## 2024-02-22 DIAGNOSIS — E66812 Obesity, class 2: Secondary | ICD-10-CM

## 2024-02-22 DIAGNOSIS — R072 Precordial pain: Secondary | ICD-10-CM | POA: Diagnosis not present

## 2024-02-22 DIAGNOSIS — H93A9 Pulsatile tinnitus, unspecified ear: Secondary | ICD-10-CM | POA: Diagnosis not present

## 2024-02-22 DIAGNOSIS — I10 Essential (primary) hypertension: Secondary | ICD-10-CM

## 2024-02-22 DIAGNOSIS — R0683 Snoring: Secondary | ICD-10-CM

## 2024-02-22 DIAGNOSIS — Z6837 Body mass index (BMI) 37.0-37.9, adult: Secondary | ICD-10-CM

## 2024-02-22 MED ORDER — METOPROLOL TARTRATE 100 MG PO TABS: 100.0000 mg | ORAL_TABLET | Freq: Once | ORAL | 0 refills | Status: AC

## 2024-02-22 NOTE — Patient Instructions (Signed)
 Medication Instructions:  Your physician recommends that you continue on your current medications as directed. Please refer to the Current Medication list given to you today.  *If you need a refill on your cardiac medications before your next appointment, please call your pharmacy*  Lab Work: None ordered  If you have labs (blood work) drawn today and your tests are completely normal, you will receive your results only by: MyChart Message (if you have MyChart) OR A paper copy in the mail If you have any lab test that is abnormal or we need to change your treatment, we will call you to review the results.  Testing/Procedures: Your physician has requested that you have a carotid duplex. This test is an ultrasound of the carotid arteries in your neck. It looks at blood flow through these arteries that supply the brain with blood. Allow one hour for this exam. There are no restrictions or special instructions.  Your physician has recommended that you have a sleep study. This test records several body functions during sleep, including: brain activity, eye movement, oxygen  and carbon dioxide blood levels, heart rate and rhythm, breathing rate and rhythm, the flow of air through your mouth and nose, snoring, body muscle movements, and chest and belly movement. THIS WILL BE MAILED TO YOU.  Your physician has requested that you have cardiac CT. Cardiac computed tomography (CT) is a painless test that uses an x-ray machine to take clear, detailed pictures of your heart. For further information please visit [email or web address] Please follow instruction sheet BELOW:    Your cardiac CT will be scheduled at one of the below locations:   [facility] [address] [place], [place] [address] [phone] (Severe contrast allergies only)  OR   [facility] [address] [place], [place] [address] [phone]  OR   [facility] [address] [place], [place] [address] [phone]  OR   [facility] and V[facility] Tower [address]  [place], [place] [address]  OR   [facility] [address] [place], [place] [phone]  If scheduled at [facility], please arrive at the [facility] and Children's Entrance (Entrance C2) of [facility] 30 minutes prior to test start time. You can use the FREE valet parking offered at entrance C (encouraged to control the heart rate for the test)  Proceed to the [facility] Radiology Department (first floor) to check-in and test prep.  All radiology patients and guests should use entrance C2 at [facility], accessed from [facility], even though the hospital's physical address listed is [address].  If scheduled at the Heart and Vascular Tower at [address], please enter the parking lot using the [address] entrance and use the FREE valet service at the patient drop-off area. Enter the building and check-in with registration on the main floor.  If scheduled at [facility], please arrive to the Heart and Vascular Center 15 mins early for check-in and test prep.  There is spacious parking and easy access to the radiology department from the [facility] Heart and Vascular entrance. Please enter here and check-in with the desk attendant.   If scheduled at [facility], please arrive 30 minutes early for check-in and test prep.  Please follow these instructions carefully (unless otherwise directed):  An IV will be required for this test and Nitroglycerin will be given.    On the Night Before  the Test: Be sure to Drink plenty of water. Do not consume any caffeinated/decaffeinated beverages or chocolate 12 hours prior to your test. Do not take any antihistamines 12 hours prior to your test.   On the Day of the Test: Drink plenty of water until 1 hour prior to the test. Do not eat any food 1  hour prior to test. You may take your regular medications prior to the test.  Take metoprolol (Lopressor) 100 MG two hours prior to test.  THIS HAS BEEN SENT TO THE PHARMACY  If you take Furosemide/Hydrochlorothiazide /Spironolactone/Chlorthalidone, please HOLD on the morning of the test. Patients who wear a continuous glucose monitor MUST remove the device prior to scanning. FEMALES- please wear underwire-free bra if available, avoid dresses & tight clothing   After the Test: Drink plenty of water. After receiving IV contrast, you may experience a mild flushed feeling. This is normal. On occasion, you may experience a mild rash up to 24 hours after the test. This is not dangerous. If this occurs, you can take Benadryl  25 mg, Zyrtec, Claritin, or Allegra and increase your fluid intake. (Patients taking Tikosyn should avoid Benadryl , and may take Zyrtec, Claritin, or Allegra) If you experience trouble breathing, this can be serious. If it is severe call 911 IMMEDIATELY. If it is mild, please call our office.  We will call to schedule your test 2-4 weeks out understanding that some insurance companies will need an authorization prior to the service being performed.   For more information and frequently asked questions, please visit our website : [email or web address]  For non-scheduling related questions, please contact the cardiac imaging nurse navigator should you have any questions/concerns: Cardiac Imaging Nurse Navigators Direct Office Dial: [phone]   For scheduling needs, including cancellations and rescheduling, please call Brittany, [phone].  Follow-Up: At [facility], you and your health needs are our priority.  As part of our continuing mission to provide you with exceptional heart care, our providers are all part of one team.  This team includes your primary Cardiologist (physician) and Advanced Practice Providers or APPs (Physician Assistants and  Nurse Practitioners) who all work together to provide you with the care you need, when you need it.  Your next appointment:   IN JANUARY   Provider:   Callie Goodrich, PA-C          We recommend signing up for the patient portal called MyChart.  Sign up information is provided on this After Visit Summary.  MyChart is used to connect with patients for Virtual Visits (Telemedicine).  Patients are able to view lab/test results, encounter notes, upcoming appointments, etc.  Non-urgent messages can be sent to your provider as well.   To learn more about what you can do with MyChart, go to [email or web address].   Other Instructions

## 2024-02-29 ENCOUNTER — Encounter (HOSPITAL_COMMUNITY): Payer: Self-pay

## 2024-03-05 ENCOUNTER — Ambulatory Visit (INDEPENDENT_AMBULATORY_CARE_PROVIDER_SITE_OTHER): Admitting: Otolaryngology

## 2024-03-05 ENCOUNTER — Ambulatory Visit (HOSPITAL_COMMUNITY)
Admission: RE | Admit: 2024-03-05 | Discharge: 2024-03-05 | Disposition: A | Discharge status: Home / Self Care | Source: Ambulatory Visit | Attending: Student | Admitting: Student

## 2024-03-05 ENCOUNTER — Encounter (INDEPENDENT_AMBULATORY_CARE_PROVIDER_SITE_OTHER): Payer: Self-pay | Admitting: Otolaryngology

## 2024-03-05 ENCOUNTER — Telehealth: Payer: Self-pay

## 2024-03-05 ENCOUNTER — Ambulatory Visit (HOSPITAL_BASED_OUTPATIENT_CLINIC_OR_DEPARTMENT_OTHER)
Admission: RE | Admit: 2024-03-05 | Discharge: 2024-03-05 | Disposition: A | Discharge status: Home / Self Care | Source: Ambulatory Visit | Attending: Student | Admitting: Student

## 2024-03-05 VITALS — BP 117/74 | HR 88 | Ht 62.0 in | Wt 200.0 lb

## 2024-03-05 DIAGNOSIS — R0683 Snoring: Secondary | ICD-10-CM | POA: Diagnosis present

## 2024-03-05 DIAGNOSIS — H93A1 Pulsatile tinnitus, right ear: Secondary | ICD-10-CM | POA: Diagnosis not present

## 2024-03-05 DIAGNOSIS — H93A9 Pulsatile tinnitus, unspecified ear: Secondary | ICD-10-CM

## 2024-03-05 DIAGNOSIS — R072 Precordial pain: Secondary | ICD-10-CM

## 2024-03-05 MED ORDER — DILTIAZEM HCL 25 MG/5ML IV SOLN: 10.0000 mg | INTRAVENOUS | Status: DC | PRN

## 2024-03-05 MED ORDER — METOPROLOL TARTRATE 5 MG/5ML IV SOLN
10.0000 mg | Freq: Once | INTRAVENOUS | Status: DC | PRN
Start: 2024-03-05 — End: 2024-03-06

## 2024-03-05 MED ORDER — NITROGLYCERIN 0.4 MG SL SUBL
0.8000 mg | SUBLINGUAL_TABLET | Freq: Once | SUBLINGUAL | Status: AC
  Administered 2024-03-05: 0.8 mg via SUBLINGUAL

## 2024-03-05 MED ORDER — NITROGLYCERIN 0.4 MG SL SUBL
SUBLINGUAL_TABLET | SUBLINGUAL | Status: AC
  Filled 2024-03-05: qty 2

## 2024-03-05 MED ORDER — ALPRAZOLAM 0.5 MG PO TABS: 0.5000 mg | ORAL_TABLET | Freq: Once | ORAL | 0 refills | Status: AC

## 2024-03-05 MED ORDER — IOHEXOL 350 MG/ML SOLN
100.0000 mL | Freq: Once | INTRAVENOUS | Status: AC | PRN
  Administered 2024-03-05: 100 mL via INTRAVENOUS

## 2024-03-05 NOTE — Telephone Encounter (Signed)
**Note De-Identified Mandel Seiden Obfuscation** Patient agreement reviewed and signed on 03/05/2024.  WatchPAT issued to patient on 03/05/2024 by Kashlyn Salinas, Avelina HERO, LPN. Patient aware to not open the WatchPAT box until contacted with the activation PIN. Patient profile initialized in CloudPAT on 03/05/2024 by Avelina Issam Carlyon, LPN. Device serial number: [id number]

## 2024-03-05 NOTE — Progress Notes (Signed)
 Dear Dr. Knute, Here is my assessment for our mutual patient, Brittany Cherry. Thank you for allowing me the opportunity to care for your patient. Please do not hesitate to contact me should you have any other questions. Sincerely, Dr. Eldora Blanch  Otolaryngology Clinic Note Referring provider: Dr. Knute HPI:  [name] is a 53 y.o. female kindly referred by Dr. Knute for evaluation of pulsatile tinnitus  Initial visit (02/2024): Discussed the use of AI scribe software for clinical note transcription with the patient, who gave verbal consent to proceed.  History of Present Illness Brittany Cherry is a 53 year old female who presents with pulsatile tinnitus in the right ear.  She describes a constant right-sided pulsating swishing/hum sound for many years that is synchronous with her heartbeat. It was initially intermittent but is now constant. Turning her head to the right or applying pressure over the right side gives temporary relief. She denies nonpulsatile ringing, fullness, drainage, or popping.  She has some imbalance. She has not had recent ear infections, trauma, ear surgery, or a recent hearing test, though the swishing makes it hard to hear conversations.  Her medical history includes migraines with headaches and light sensitivity.  No sound or pressure induced vertigo, no autophony.   Patient denies: ear pain, fullness, drainage Patient additionally denies: deep pain in ear canal, eustachian tube symptoms such as popping, crackling, sensitive to pressure changes Patient also denies barotrauma, vestibular suppressant use, ototoxic medication use Prior ear surgery: no No frequent ear infections; no autoimmune disease; No cardiac history.   ENT Surgery: no Personal or FHx of bleeding dz or anesthesia difficulty: no  GLP-1: yes AP/AC: no  Tobacco: prior, quit  PMHx: HTN, GAD, HLD, Migraines  Independent Review of Additional Tests or Records:  Thersia  Caudle (02/08/2024): noted angina like pain and swooshing sound in right ear. Several years. Dx: Pulsatile tinnitus; Rx: ref to ENT CBC and CMP 02/08/2024: WBC 5.8, BUN/Cr 11/0.76 Autoimmune labs 11/30/2022: Sed, ANA - neg/wnl Carotid Duplex 03/05/2024:   PMH/Meds/All/SocHx/FamHx/ROS:   Past Medical History:  Diagnosis Date   Adenoma of right adrenal gland 02/15/2023   Allergy    AMA (advanced maternal age) multigravida 35+    Anemia    Anxiety    Arthritis    Blood transfusion without reported diagnosis    Depression    GERD (gastroesophageal reflux disease)    Headache    Hyperemesis gravidarum    Hypertension    Sleep apnea    Suspect     Past Surgical History:  Procedure Laterality Date   CHOLECYSTECTOMY N/A 04/08/2020   Procedure: LAPAROSCOPIC CHOLECYSTECTOMY;  Surgeon: Dasie Leonor CROME, MD;  Location: [facility] OR;  Service: General;  Laterality: N/A;   COLONOSCOPY WITH PROPOFOL  N/A 02/27/2017   Procedure: COLONOSCOPY WITH PROPOFOL ;  Surgeon: Saintclair Jasper, MD;  Location: [facility] ENDOSCOPY;  Service: Gastroenterology;  Laterality: N/A;   DILATION AND CURETTAGE OF UTERUS     ESOPHAGOGASTRODUODENOSCOPY (EGD) WITH PROPOFOL  N/A 02/27/2017   Procedure: ESOPHAGOGASTRODUODENOSCOPY (EGD) WITH PROPOFOL ;  Surgeon: Saintclair Jasper, MD;  Location: [facility] ENDOSCOPY;  Service: Gastroenterology;  Laterality: N/A;   KNEE ARTHROSCOPY Bilateral    TUBAL LIGATION Bilateral 08/18/2013   Procedure: POST PARTUM TUBAL LIGATION;  Surgeon: Vonn VEAR Inch, MD;  Location: [facility] ORS;  Service: Gynecology;  Laterality: Bilateral;   UPPER GASTROINTESTINAL ENDOSCOPY     WISDOM TOOTH EXTRACTION      Family History  Problem Relation Age of Onset   Hypertension Mother  Hyperlipidemia Mother    Anxiety disorder Mother    Arthritis Mother    Pancreatic cancer Maternal Aunt    Breast cancer Maternal Grandmother    Leukemia Paternal Grandfather    Cancer Maternal Aunt    Intellectual disability Son    Colon cancer Neg  Hx    Esophageal cancer Neg Hx    Rectal cancer Neg Hx    Stomach cancer Neg Hx      Social Connections: Moderately Isolated (02/04/2024)   Social Connection and Isolation Panel    Frequency of Communication with Friends and Family: More than three times a week    Frequency of Social Gatherings with Friends and Family: Once a week    Attends Religious Services: 1 to 4 times per year    Active Member of [employer] or Organizations: No    Attends Engineer, Structural: Not on file    Marital Status: Separated      Current Outpatient Medications:    ALPRAZolam  (XANAX ) 0.5 MG tablet, Take 1 tablet (0.5 mg total) by mouth once for 1 dose. Prior to MRI, Disp: 1 tablet, Rfl: 0   aspirin  EC 81 MG tablet, Take 81 mg by mouth daily. Swallow whole., Disp: , Rfl:    busPIRone  (BUSPAR ) 10 MG tablet, Take 1 tablet (10 mg total) by mouth 2 (two) times daily., Disp: 180 tablet, Rfl: 3   hydrALAZINE  (APRESOLINE ) 10 MG tablet, Take 1 tablet (10 mg total) by mouth 2 (two) times daily as needed (if BP is over 150/90)., Disp: 45 tablet, Rfl: 0   hydrochlorothiazide  (HYDRODIURIL ) 12.5 MG tablet, Take 1 tablet (12.5 mg total) by mouth daily., Disp: 90 tablet, Rfl: 3   losartan  (COZAAR ) 25 MG tablet, Take 1 tablet (25 mg total) by mouth daily., Disp: 90 tablet, Rfl: 3   metoprolol  tartrate (LOPRESSOR ) 100 MG tablet, Take 1 tablet (100 mg total) by mouth once for 1 dose. Take 90-120 minutes prior to scan. Hold for SBP less than 110., Disp: 1 tablet, Rfl: 0   Semaglutide-Weight Management 0.5 MG/0.5ML SOAJ, Inject 0.5 mg into the skin., Disp: , Rfl:    Physical Exam:   BP 117/74 (BP Location: Right Arm, Patient Position: Sitting, Cuff Size: Large)   Pulse 88   Ht 5' 2 (1.575 m)   Wt 200 lb (90.7 kg)   SpO2 96%   BMI 36.58 kg/m   Salient findings:  CN II-XII intact Given history and complaints, ear microscopy was indicated and performed for evaluation with findings as below in physical exam section  and in procedures; Bilateral EAC clear and TM intact with well pneumatized middle ear spaces; no lesion noted Weber 512: mid Rinne 512: AC > BC b/l Anterior rhinoscopy: Septum intact; bilateral inferior turbinates without significant hypertrophy No lesions of oral cavity/oropharynx No obviously palpable neck masses/lymphadenopathy/thyromegaly No respiratory distress or stridor No objective pulsatile tinnitus on auscultation; no carotid bruits; reports right neck pressure and turning head to right resolves pulsatile tinnitus  Seprately Identifiable Procedures:  Prior to initiating any procedures, risks/benefits/alternatives were explained to the patient and verbal consent obtained. Procedure: Bilateral ear microscopy using microscope (CPT [id number]) Pre-procedure diagnosis: pulsatile tinnitus Post-procedure diagnosis: same Indication: see above; given patient's otologic complaints and history, for improved and comprehensive examination of external ear and tympanic membrane, bilateral otologic examination using microscope was performed. Prior to proceeding, verbal consent was obtained after discussion of R/B/A  Procedure: Patient was placed semi-recumbent. Both ear canals were examined using the microscope  with findings above. Patient tolerated the procedure well.   Impression & Plans:  Kimmarie Pascale is a 53 y.o. female with:  1. Pulsatile tinnitus    Right ear pulsatile tinnitus which resolves with neck pressure -- suspect venous; we discussed other DDX including vascular, IIH etc. Will get CTA and MRV and audio first. Can consider NSGY referral based on results  - f/u 2 months with imaging and audio; discussed benzo for prior to MRI and she wishes for this  See below regarding exact medications prescribed this encounter including dosages and route: Meds ordered this encounter  Medications   ALPRAZolam  (XANAX ) 0.5 MG tablet    Sig: Take 1 tablet (0.5 mg total) by mouth once for 1  dose. Prior to MRI    Dispense:  1 tablet    Refill:  0      Thank you for allowing me the opportunity to care for your patient. Please do not hesitate to contact me should you have any other questions.  Sincerely, Eldora Blanch, MD Otolaryngologist (ENT), [facility] Phone: [phone] Fax: [phone]  03/05/2024, 1:20 PM   MDM:  Level 4 - 99204 Complexity/Problems addressed: mod - chronic problem, now worsening Data complexity: mod - independent review of note, lab, test;  - Morbidity: mod - Prescription Drug prescribed or managed: y

## 2024-03-05 NOTE — Telephone Encounter (Signed)
**Note De-Identified Sokhna Christoph Obfuscation** Ordering provider: Aline Door, PA-c Associated diagnoses: Snoring-R06.83 and Morning Headaches-R51.9  WatchPAT PA obtained on 03/05/2024 by Alessia Gonsalez, Avelina HERO, LPN. Authorization: Per the [facility] Provider Portal, a PA is not required for CPT Code: 04199 (Itamar-HST).   Patient notified of PIN (1234) on 03/05/2024 [place] Hagger Notification Method: phone. The pt stated that she is currently waiting to do a carotid doppler downstairs and plans to come up to the 5th floor (at [address] [facility] in [place]) afterwards to pick up a WatchPAT One-HST Device with instructions on how to use it. She is aware to go to one of our check in desk and let them know that she is here to see Macario concerning a sleep device.  Phone note routed to covering staff for follow-up.

## 2024-03-06 ENCOUNTER — Other Ambulatory Visit (HOSPITAL_BASED_OUTPATIENT_CLINIC_OR_DEPARTMENT_OTHER): Payer: Self-pay | Admitting: Family Medicine

## 2024-03-06 ENCOUNTER — Ambulatory Visit: Payer: Self-pay | Admitting: Student

## 2024-03-11 ENCOUNTER — Encounter: Payer: Self-pay | Admitting: Cardiology

## 2024-03-11 DIAGNOSIS — R0683 Snoring: Secondary | ICD-10-CM

## 2024-03-12 ENCOUNTER — Ambulatory Visit (INDEPENDENT_AMBULATORY_CARE_PROVIDER_SITE_OTHER): Admitting: Audiology

## 2024-03-12 ENCOUNTER — Telehealth (INDEPENDENT_AMBULATORY_CARE_PROVIDER_SITE_OTHER): Payer: Self-pay

## 2024-03-12 DIAGNOSIS — H93291 Other abnormal auditory perceptions, right ear: Secondary | ICD-10-CM

## 2024-03-12 DIAGNOSIS — Z0110 Encounter for examination of ears and hearing without abnormal findings: Secondary | ICD-10-CM

## 2024-03-12 NOTE — Telephone Encounter (Signed)
 LVM Informing patient of provider's note/recommendation.

## 2024-03-12 NOTE — Telephone Encounter (Signed)
-----   Message from Eldora KATHEE Blanch sent at 03/12/2024  9:50 AM EST ----- Just let her know her hearing test was normal. Will see her after her MRI and imaging. ----- Message ----- From: Karlis Rosaline Jansky, AUD Sent: 03/12/2024   9:38 AM EST To: Eldora KATHEE Blanch, MD  Audiogram ready for review

## 2024-03-12 NOTE — Progress Notes (Signed)
  [address], [address] [place], [place] [address] [phone]  Audiological Evaluation    Name: Brittany Cherry     DOB:   1970/12/11      MRN:   [id number]                                                                                     Service Date: 03/12/2024     Accompanied by: self   Patient comes today after Dr. Tobie, ENT sent a referral for a hearing evaluation due to concerns with tinnitus.   Symptoms Yes Details  Hearing loss  []    Tinnitus  [x]  Pulsating in the right ear, comes and go, when moving the neck  Ear pain/ infections/pressure  []    Balance problems  [x]  Unknown trigger, off balance, while walking, has had for a long time but now seeming to get worse   Noise exposure history  []  none  Previous ear surgeries  []    Family history of hearing loss  []  none  Amplification  []    Other  []      Otoscopy: Right ear: Clear external ear canal and notable landmarks visualized on the tympanic membrane. Left ear:  Clear external ear canal and notable landmarks visualized on the tympanic membrane.  Tympanometry: Right ear: Normal external ear canal volume with normal middle ear pressure and tympanic membrane compliance (Type A). Findings are suggestive of normal middle ear function. Left ear: Normal external ear canal volume with normal middle ear pressure and tympanic membrane compliance (Type A). Findings are suggestive of normal middle ear function.  Hearing Evaluation The hearing test results were completed under headphones and re-checked with inserts and results are deemed to be of good reliability. Test technique:  conventional    Pure tone Audiometry: Both ears- Normal hearing from [phone] Hz.  Speech Audiometry: Right ear- Speech Reception Threshold (SRT) was obtained at 10 dBHL. Left ear-Speech Reception Threshold (SRT) was obtained at 10 dBHL.   Word Recognition Score Tested using NU-6 (recorded) Right ear: 100% was obtained at a presentation  level of 50 dBHL with contralateral masking which is deemed as  excellent. Left ear: 100% was obtained at a presentation level of 50 dBHL with contralateral masking which is deemed as  excellent.   Impression: There is not a significant difference in pure-tone thresholds between ears. There is not a significant difference in the word recognition score in between ears.    Recommendations: Follow up with ENT as scheduled for today.  Return for a hearing evaluation if concerns with hearing changes arise or per MD recommendation.   Milana Salay MARIE LEROUX-MARTINEZ, AUD

## 2024-03-15 ENCOUNTER — Ambulatory Visit: Attending: Student

## 2024-03-15 NOTE — Procedures (Signed)
    SLEEP STUDY REPORT Patient Information Study Date: 03/11/2024 Patient Name: Brittany Cherry Patient ID: [id number] Birth Date: June 02, 1970 Age: 53 Gender: Female BMI: 37.3 (W=203 lb, H=5' 2'') Referring Physician: Aline Door, PA  TEST DESCRIPTION: Home sleep apnea testing was completed using the WatchPat, a Type 1 device, utilizing peripheral arterial tonometry (PAT), chest movement, actigraphy, pulse oximetry, pulse rate, body position and snore. AHI was calculated with apnea and hypopnea using valid sleep time as the denominator. RDI includes apneas, hypopneas, and RERAs. The data acquired and the scoring of sleep and all associated events were performed in accordance with the recommended standards and specifications as outlined in the AASM Manual for the Scoring of Sleep and Associated Events 2.2.0 (2015).  FINDINGS: 1. No evidence of Obstructive Sleep Apnea with AHI 3.8/hr. 2. No Central Sleep Apnea. 3. Oxygen  desaturations as low as 90%. 4. Mild to moderate snoring was present. O2 sats were < 88% for 0 minutes. 5. Total sleep time was 6 hrs and 35 min. 6. 24.3% of total sleep time was spent in REM sleep. 7. Normal sleep onset latency at 10 min. 8. Shortened REM sleep onset latency at 57 min. 9. Total awakenings were 8.  DIAGNOSIS: Normal study with no significant sleep disordered breathing.  RECOMMENDATIONS: 1. Normal study with no significant sleep disordered breathing. 2. Healthy sleep recommendations include: adequate nightly sleep (normal 7-9 hrs/night), avoidance of caffeine after noon and alcohol near bedtime, and maintaining a sleep environment that is cool, dark and quiet. 3. Weight loss for overweight patients is recommended. 4. Snoring recommendations include: weight loss where appropriate, side sleeping, and avoidance of alcohol before bed. 5. Operation of motor vehicle or dangerous equipment must be avoided when feeling drowsy, excessively sleepy,  or mentally fatigued. 6. An ENT consultation which may be useful for specific causes of and possible treatment of bothersome snoring . 7. Weight loss may be of benefit in reducing the severity of snoring.   Signature: Wilbert Bihari, MD; [facility]; Diplomat, American Board of Sleep Medicine Electronically Signed: 03/15/2024 8:42:24 PM

## 2024-03-18 ENCOUNTER — Telehealth: Payer: Self-pay | Admitting: *Deleted

## 2024-03-18 NOTE — Telephone Encounter (Signed)
 The patient has been notified of the result via his mychart.

## 2024-03-18 NOTE — Telephone Encounter (Signed)
-----   Message from Brittany Cherry sent at 03/15/2024  8:43 PM EST ----- Please let patient know that sleep study showed no significant sleep apnea.

## 2024-03-26 ENCOUNTER — Ambulatory Visit (HOSPITAL_COMMUNITY)

## 2024-04-17 ENCOUNTER — Other Ambulatory Visit (HOSPITAL_BASED_OUTPATIENT_CLINIC_OR_DEPARTMENT_OTHER): Payer: Self-pay | Admitting: Family Medicine

## 2024-04-23 ENCOUNTER — Telehealth (INDEPENDENT_AMBULATORY_CARE_PROVIDER_SITE_OTHER): Payer: Self-pay | Admitting: Otolaryngology

## 2024-04-23 NOTE — Telephone Encounter (Signed)
 states that when she tried to call to schedule appointments - they told her the order still says [facility] and that the order needs to state [facility].  Please advise and call patient.

## 2024-05-07 ENCOUNTER — Inpatient Hospital Stay: Admission: RE | Admit: 2024-05-07 | Source: Ambulatory Visit

## 2024-05-10 ENCOUNTER — Ambulatory Visit (HOSPITAL_BASED_OUTPATIENT_CLINIC_OR_DEPARTMENT_OTHER)

## 2024-05-20 ENCOUNTER — Inpatient Hospital Stay: Admission: RE | Admit: 2024-05-20 | Source: Ambulatory Visit

## 2025-02-11 ENCOUNTER — Encounter (HOSPITAL_BASED_OUTPATIENT_CLINIC_OR_DEPARTMENT_OTHER): Admitting: Family Medicine
